# Patient Record
Sex: Female | Born: 1965 | Race: White | Hispanic: No | Marital: Married | Smoking: Never smoker
Health system: Southern US, Community
[De-identification: ages and names within clinical notes are randomized; demographics above are authoritative.]

## PROBLEM LIST (undated history)

## (undated) DIAGNOSIS — K5792 Diverticulitis of intestine, part unspecified, without perforation or abscess without bleeding: Secondary | ICD-10-CM

## (undated) DIAGNOSIS — K5730 Diverticulosis of large intestine without perforation or abscess without bleeding: Secondary | ICD-10-CM

## (undated) DIAGNOSIS — Z8742 Personal history of other diseases of the female genital tract: Secondary | ICD-10-CM

## (undated) DIAGNOSIS — K589 Irritable bowel syndrome without diarrhea: Secondary | ICD-10-CM

## (undated) DIAGNOSIS — G43909 Migraine, unspecified, not intractable, without status migrainosus: Secondary | ICD-10-CM

## (undated) HISTORY — PX: DILATION AND CURETTAGE OF UTERUS: SHX78

## (undated) HISTORY — PX: CHOLECYSTECTOMY: SHX55

## (undated) HISTORY — DX: Diverticulitis of intestine, part unspecified, without perforation or abscess without bleeding: K57.92

## (undated) HISTORY — DX: Diverticulosis of large intestine without perforation or abscess without bleeding: K57.30

## (undated) HISTORY — PX: WRIST ARTHROCENTESIS: SUR48

## (undated) HISTORY — DX: Irritable bowel syndrome, unspecified: K58.9

## (undated) HISTORY — PX: OTHER SURGICAL HISTORY: SHX169

## (undated) HISTORY — DX: Personal history of other diseases of the female genital tract: Z87.42

## (undated) HISTORY — PX: TONSILLECTOMY: SUR1361

## (undated) HISTORY — DX: Migraine, unspecified, not intractable, without status migrainosus: G43.909

---

## 1997-11-19 ENCOUNTER — Inpatient Hospital Stay (HOSPITAL_COMMUNITY): Admission: RE | Admit: 1997-11-19 | Discharge: 1997-11-19 | Payer: Self-pay | Admitting: Obstetrics and Gynecology

## 1998-01-10 ENCOUNTER — Inpatient Hospital Stay (HOSPITAL_COMMUNITY): Admission: AD | Admit: 1998-01-10 | Discharge: 1998-01-13 | Payer: Self-pay | Admitting: Obstetrics and Gynecology

## 1998-10-12 ENCOUNTER — Ambulatory Visit (HOSPITAL_COMMUNITY): Admission: RE | Admit: 1998-10-12 | Discharge: 1998-10-12 | Payer: Self-pay | Admitting: Internal Medicine

## 1999-03-20 ENCOUNTER — Other Ambulatory Visit: Admission: RE | Admit: 1999-03-20 | Discharge: 1999-03-20 | Payer: Self-pay | Admitting: Obstetrics and Gynecology

## 1999-08-18 ENCOUNTER — Ambulatory Visit (HOSPITAL_COMMUNITY): Admission: RE | Admit: 1999-08-18 | Discharge: 1999-08-18 | Payer: Self-pay | Admitting: Internal Medicine

## 1999-08-18 ENCOUNTER — Encounter: Payer: Self-pay | Admitting: Internal Medicine

## 2000-04-19 ENCOUNTER — Other Ambulatory Visit: Admission: RE | Admit: 2000-04-19 | Discharge: 2000-04-19 | Payer: Self-pay | Admitting: Obstetrics and Gynecology

## 2000-12-03 ENCOUNTER — Ambulatory Visit (HOSPITAL_COMMUNITY): Admission: RE | Admit: 2000-12-03 | Discharge: 2000-12-03 | Payer: Self-pay | Admitting: *Deleted

## 2000-12-03 ENCOUNTER — Encounter: Payer: Self-pay | Admitting: *Deleted

## 2001-05-20 ENCOUNTER — Other Ambulatory Visit: Admission: RE | Admit: 2001-05-20 | Discharge: 2001-05-20 | Payer: Self-pay | Admitting: Obstetrics and Gynecology

## 2002-06-03 ENCOUNTER — Other Ambulatory Visit: Admission: RE | Admit: 2002-06-03 | Discharge: 2002-06-03 | Payer: Self-pay | Admitting: Obstetrics and Gynecology

## 2003-04-09 ENCOUNTER — Ambulatory Visit (HOSPITAL_COMMUNITY): Admission: RE | Admit: 2003-04-09 | Discharge: 2003-04-09 | Payer: Self-pay | Admitting: Internal Medicine

## 2003-05-26 ENCOUNTER — Ambulatory Visit (HOSPITAL_COMMUNITY): Admission: RE | Admit: 2003-05-26 | Discharge: 2003-05-26 | Payer: Self-pay | Admitting: Obstetrics and Gynecology

## 2003-08-23 ENCOUNTER — Ambulatory Visit (HOSPITAL_COMMUNITY): Admission: RE | Admit: 2003-08-23 | Discharge: 2003-08-23 | Payer: Self-pay | Admitting: Internal Medicine

## 2003-08-31 ENCOUNTER — Emergency Department (HOSPITAL_COMMUNITY): Admission: EM | Admit: 2003-08-31 | Discharge: 2003-08-31 | Payer: Self-pay | Admitting: Emergency Medicine

## 2003-09-08 ENCOUNTER — Other Ambulatory Visit: Admission: RE | Admit: 2003-09-08 | Discharge: 2003-09-08 | Payer: Self-pay | Admitting: Obstetrics and Gynecology

## 2003-09-09 ENCOUNTER — Observation Stay (HOSPITAL_COMMUNITY): Admission: RE | Admit: 2003-09-09 | Discharge: 2003-09-10 | Payer: Self-pay | Admitting: General Surgery

## 2003-09-09 ENCOUNTER — Encounter (INDEPENDENT_AMBULATORY_CARE_PROVIDER_SITE_OTHER): Payer: Self-pay | Admitting: Specialist

## 2005-01-15 ENCOUNTER — Other Ambulatory Visit: Admission: RE | Admit: 2005-01-15 | Discharge: 2005-01-15 | Payer: Self-pay | Admitting: Obstetrics and Gynecology

## 2008-01-27 ENCOUNTER — Ambulatory Visit: Payer: Self-pay | Admitting: Nurse Practitioner

## 2008-03-09 ENCOUNTER — Ambulatory Visit: Payer: Self-pay | Admitting: Nurse Practitioner

## 2008-09-07 ENCOUNTER — Ambulatory Visit: Payer: Self-pay | Admitting: Nurse Practitioner

## 2008-09-15 ENCOUNTER — Ambulatory Visit (HOSPITAL_COMMUNITY): Admission: RE | Admit: 2008-09-15 | Discharge: 2008-09-15 | Payer: Self-pay | Admitting: Obstetrics and Gynecology

## 2008-12-14 ENCOUNTER — Ambulatory Visit: Payer: Self-pay | Admitting: Nurse Practitioner

## 2009-01-04 ENCOUNTER — Ambulatory Visit: Payer: Self-pay | Admitting: Nurse Practitioner

## 2009-03-01 ENCOUNTER — Ambulatory Visit: Payer: Self-pay | Admitting: Nurse Practitioner

## 2009-06-28 ENCOUNTER — Ambulatory Visit: Payer: Self-pay | Admitting: Nurse Practitioner

## 2010-04-11 ENCOUNTER — Ambulatory Visit: Payer: Self-pay | Admitting: Nurse Practitioner

## 2010-08-08 ENCOUNTER — Encounter (INDEPENDENT_AMBULATORY_CARE_PROVIDER_SITE_OTHER): Payer: 59 | Admitting: Nurse Practitioner

## 2010-08-08 DIAGNOSIS — G43109 Migraine with aura, not intractable, without status migrainosus: Secondary | ICD-10-CM

## 2010-08-09 NOTE — Assessment & Plan Note (Unsigned)
NAME:  Krista Carpenter, Krista Carpenter NO.:  [id number]  MEDICAL RECORD NO.:  [id number]           PATIENT TYPE:  LOCATION:  [facility] at [facility]           FACILITY:  PHYSICIAN:  Remonia Richter, NP   DATE OF BIRTH:  [birth year]  DATE OF SERVICE:  08/08/2010                                 CLINIC NOTE  The patient comes to the office today to discuss Botox injections.  The patient has been on multiple migraine headache preventatives in the past including Depakote, imipramine, atenolol, verapamil, amitriptyline, Effexor, Lexapro, Keppra, and Cymbalta.  She is currently on Topamax 50 mg 3 tablets per day.  She does not feel that she is able to go up on this dose as it actually makes her feel evil.  She does have significant irritability as a side effect.  She states that she is currently having 10 moderate headaches per month and 15 mild.  She in fact has a daily migraine headache of some sort.  The patient is well known to me.  She takes Relpax, Zoloft, and Vicodin as needed for headache.  She does appear to be an appropriate candidate for Botox injections.  We will see her back in the office after her approval.     Remonia Richter, NP    LR/MEDQ  D:  08/08/2010  T:  08/09/2010  Job:  [id number]

## 2010-08-24 ENCOUNTER — Other Ambulatory Visit (HOSPITAL_COMMUNITY): Payer: Self-pay | Admitting: Obstetrics and Gynecology

## 2010-08-24 DIAGNOSIS — Z1231 Encounter for screening mammogram for malignant neoplasm of breast: Secondary | ICD-10-CM

## 2010-08-31 ENCOUNTER — Ambulatory Visit (HOSPITAL_COMMUNITY): Payer: 59

## 2010-09-01 ENCOUNTER — Ambulatory Visit (HOSPITAL_COMMUNITY)
Admission: RE | Admit: 2010-09-01 | Discharge: 2010-09-01 | Disposition: A | Discharge status: Home / Self Care | Payer: 59 | Source: Ambulatory Visit | Attending: Obstetrics and Gynecology | Admitting: Obstetrics and Gynecology

## 2010-09-01 DIAGNOSIS — Z1231 Encounter for screening mammogram for malignant neoplasm of breast: Secondary | ICD-10-CM

## 2010-09-05 NOTE — Assessment & Plan Note (Signed)
NAME:  Krista Carpenter, Krista Carpenter               ACCOUNT NO.:  [id number]   MEDICAL RECORD NO.:  [id number]          PATIENT TYPE:  POB   LOCATION:  [facility] at [facility]         FACILITY:  [facility]   PHYSICIAN:  Tinnie Gens, MD        DATE OF BIRTH:  [birth year]   DATE OF SERVICE:                                  CLINIC NOTE   The patient comes to office today for followup on her migraine  headaches.  She did feel both the Mobic and the Norflex were helpful  with her headaches.  She also felt the going up on the Topamax was  helpful.  She has been having some emotional instability since going up  on the Topamax.  She feels that this may be related to her Lexapro  wearing out.  She is interested in switching her Lexapro over to  something else.  She has been on Effexor in the past.  When she was at  [facility], they discontinued the Effexor over a 2-  week period precipitating a hypertensive crisis.  She has also been on  imipramine in the past, does not want to take anything else that causes  weight gain.   PHYSICAL EXAM:  VITAL SIGNS:  Blood pressure is 113/76, pulse is 72,  weight is 147.   ALLERGIES:  No known allergies.   ASSESSMENT:  1. Migraine headache.  2. Anxiety.   PLAN:  We had a lengthy discussion concerning side effects of Topamax.  The patient is also convinced that she would like to go up on her  Lexapro as she is at the highest dose of Lexapro, we will switch her  over to 50 mg of Zoloft one p.o. daily #30 with 3 refills.  She will  also be given Xanax 0.5 mg one p.o. p.r.n. anxiety #20 with 1 refill.  She is asked to take this prior to becoming emotionally upset.  She is  also asking for refill on her Relpax.  She will be given Relpax 40 mg  one p.o. p.r.n. migraine #9 with p.r.n. refills.  The patient is asked  to return in 6 weeks or sooner as needed.      Remonia Richter, NP    ______________________________  Tinnie Gens, MD    LR/MEDQ  D:   01/04/2009  T:  01/05/2009  Job:  [id number]

## 2010-09-05 NOTE — Assessment & Plan Note (Signed)
NAME:  Krista Carpenter, Krista Carpenter               ACCOUNT NO.:  [id number]   MEDICAL RECORD NO.:  [id number]          PATIENT TYPE:  POB   LOCATION:  [facility] at [facility]         FACILITY:  [facility]   PHYSICIAN:  Jaynie Collins, MD     DATE OF BIRTH:  1966-04-19   DATE OF SERVICE:  12/14/2008                                  CLINIC NOTE   The patient comes to office today for followup on her migraine  headaches.  She feels that her headaches have been worse in the last few  weeks.  She has had a headache almost every day.  She had been taking  Vicodin and/or Relpax every day.  She had been seen back in May, from  that time she had gone up on her Topamax to 150 mg.  She does not admit  any more stress or anxiety than normal.  She has been having some neck  pain.  Her neck has been bothering her for a while.  She had been taking  Mobic for wrist problem, but had discontinued that.   PHYSICAL EXAMINATION:  VITAL SIGNS:  Blood pressure is 127/79, pulse is  76, weight is 148, height is 5 feet.   ASSESSMENT:  Migraine headaches and neck pain.   PLAN:  The patient will go up on her Topamax 200 mg.  She will take 1  p.o. nightly #30 with 6 refills.  She was also given a muscle relaxer  called Norflex, she can take one of those every 8 hours p.r.n. #40 with  3 refills.  She is also asked to return to taking her Mobic daily.  We  discussed at length some practical things such as not sleeping on her  stomach and improved posture.  If these things are unsuccessful for her,  she will call us back.  She does have a scheduled appointment in  November, and we can certainly see her before then if need be.      Remonia Richter, NP    ______________________________  Jaynie Collins, MD    LR/MEDQ  D:  12/14/2008  T:  12/15/2008  Job:  [id number]

## 2010-09-05 NOTE — Assessment & Plan Note (Signed)
NAME:  Krista, Carpenter NO.:  [id number]   MEDICAL RECORD NO.:  [id number]          PATIENT TYPE:  POB   LOCATION:  [facility] at [facility]         FACILITY:  [facility]   PHYSICIAN:  Argentina Donovan, MD        DATE OF BIRTH:  Aug 04, 1965   DATE OF SERVICE:                                  CLINIC NOTE   The patient comes to office today for followup on migraine headaches.  The patient took her prednisone pack and it did seem to break her  headache cycle.  She has stopped over using the Tylenol and anti-  inflammatories.  She has gotten up to 100 mg on her Topamax and she  feels that overall she is doing great.  She has only used 2-3 Relpax in  last 6 weeks.  She feels that this is the best that she has done in  years and would like to maintain at where she is now.  We will give her  a prescription for Topamax 100 mg #30 with six refills.  She will follow  up in 6 months or sooner as needed.      Krista Richter, NP    ______________________________  Argentina Donovan, MD    LR/MEDQ  D:  03/09/2008  T:  03/10/2008  Job:  [id number]

## 2010-09-05 NOTE — Assessment & Plan Note (Signed)
NAME:  Krista Carpenter, Krista Carpenter               ACCOUNT NO.:  [id number]   MEDICAL RECORD NO.:  [id number]          PATIENT TYPE:  POB   LOCATION:  [facility]         FACILITY:  [facility]   PHYSICIAN:  Scheryl Darter, MD       DATE OF BIRTH:  March 16, 1966   DATE OF SERVICE:                                  CLINIC NOTE   The patient comes to office today for followup on her migraine  headaches.  Since her last office visit, she has come off the Lexapro  and gone up to the Zoloft.  She is currently at 50 mg.  She is currently  at 150 mg of the Topamax.  She does not feel that the Relpax with the  Motrin 600 mg has been helpful.  Recently, we did have a lengthy  discussion today concerning emotional factors.  She does admit that her  daughter, Krista Carpenter, has social needs.  She has some difficulties letting  her mother leave and as a result, Krista Carpenter is not doing much for  herself.  Obviously, a lot more complicated than this, the child sees a  psychologist, has not been medicated, this has been ongoing since before  kindergarten.   ASSESSMENT:  Migraine headache.   PLAN:  Again, we had a lengthy discussion concerning stressors in her  life.  She was given multiple options including counseling for herself,  physical therapy, massage therapy, and exercise.  At this point, she  will go up on her Zoloft to 100 mg.  She will take 75 mg for a week and  then go up to 100.  She is given a prescription for 100, #30, with  p.r.n. refills of the Treximet.  We will change her over from Relpax to  Treximet.  She is given one sample and a prescription for #9 with p.r.n.  refills.  The patient will follow up in 4 weeks or sooner as needed.      Krista Richter, NP    ______________________________  Scheryl Darter, MD    LR/MEDQ  D:  03/01/2009  T:  03/02/2009  Job:  [id number]

## 2010-09-05 NOTE — Assessment & Plan Note (Signed)
NAME:  Krista Carpenter, Krista Carpenter               ACCOUNT NO.:  [id number]   MEDICAL RECORD NO.:  [id number]          PATIENT TYPE:  POB   LOCATION:  [facility] at [facility]         FACILITY:  [facility]   PHYSICIAN:  Tinnie Gens, MD        DATE OF BIRTH:  [birth year]   DATE OF SERVICE:  09/07/2008                                  CLINIC NOTE   The patient comes today to the office for followup on her migraine  headaches.  The patient was last seen in November 2009.  At that point,  she was doing very well.  She was on the 100 mg of Topamax taking Relpax  as needed and overall doing well.  In the last few months, she feels  that her daily headaches have returned.  She felt originally that this  may be related to worsening of her allergies.  She particularly had a  very severe headache on Sunday.  She did not have her medications with  her at church and after church, they had a blood drive and by the time  she got home, she was suffering severely, since then she has not been  able to go to work on Monday or had to leave work on Monday and today is  requesting trigger point injections.   PHYSICAL EXAMINATION:  GENERAL:  Well-developed, well-nourished, 45-year-  old Caucasian female, in no acute distress.  HEENT:  Head is normocephalic and atraumatic.  The temple area as well  as the occipital area are tender to touch.   PROCEDURE:  Please see trigger point injection.   PLAN:  We had a very lengthy discussion today.  We have decided to go up  on her Topamax 250 mg t.i.d.  She is given a prescription for #90 with 6  refills.  She will also be given a refill on her rescue medication,  Vicodin EF 1 p.o. q.6 h p.r.n. pain #30 with no refills.  She is also  asked to add Phenergan as need be, Phenergan prescription is 25 mg 1  p.o. q.6 h p.r.n. #30 with 2 refills.  She is also given trigger point  injections today and she will be given a prescription for prednisone  taper that she will have in the future if she  has a significant headache  like this.  She is asked to follow up in 6 months or sooner.      Remonia Richter, NP    ______________________________  Tinnie Gens, MD    LR/MEDQ  D:  09/07/2008  T:  09/08/2008  Job:  [id number]

## 2010-09-05 NOTE — Assessment & Plan Note (Signed)
NAME:  Krista Carpenter, PATERNOSTRO NO.:  [id number]   MEDICAL RECORD NO.:  [id number]          PATIENT TYPE:  POB   LOCATION:  [facility] at [facility]         FACILITY:  [facility]   PHYSICIAN:  Tinnie Gens, MD        DATE OF BIRTH:  March 30, 1966   DATE OF SERVICE:  04/11/2010                                  CLINIC NOTE   The patient comes to the office today for followup on her migraine  headaches.  The patient was last seen in March 2011.  Since then, she  was doing very well.  She did have an episode this past summer where she  had some problems with her headaches, but since then she has been doing  well up until a few weeks ago.  She feels that she is just stressed with  Christmas.  She is requesting either trigger point injection or  prednisone taper.   PHYSICAL EXAMINATION:  VITAL SIGNS:  Blood pressure is 120/81, pulse is  80, weight is 145.  CARDIAC:  Regular rate and rhythm.  LUNGS:  Clear bilaterally.  NEUROLOGICAL:  Alert, oriented.   ASSESSMENT:  Migraine.   PLAN:  Refill meds, Relpax, Topamax, Zoloft, Norflex, Xanax.  She will  be given prednisone taper with one refill and Vicodin for rescue.  She  will return to the clinic in 6 months to 1 year.      Remonia Richter, NP    ______________________________  Tinnie Gens, MD    LR/MEDQ  D:  04/11/2010  T:  04/12/2010  Job:  [id number]

## 2010-09-05 NOTE — Assessment & Plan Note (Signed)
NAME:  Krista Carpenter, Krista Carpenter NO.:  [id number]   MEDICAL RECORD NO.:  [id number]          PATIENT TYPE:  POB   LOCATION:  [facility] at [facility]         FACILITY:  [facility]   PHYSICIAN:  Scheryl Darter, MD       DATE OF BIRTH:  1966/03/26   DATE OF SERVICE:  06/28/2009                                  CLINIC NOTE   The patient comes to the office today for followup on her migraine  headaches.  The patient is doing very well with her headaches.  Approximately 1 month ago, she took a drug holiday from her verapamil.  She feels that her headaches have improved.  Since then she is not sure  if there is a relationship.  She was put on the verapamil following a  rapid withdrawal from Effexor.  She had never really developed high  blood pressure.  She has been taking her blood pressure at work and her  blood pressure has been fine.  She had to go up on the Zoloft to 100 mg.  She is currently at 150 of the Topamax.  She did not feel that the  Topamax is very helpful and would like to trial a Maxalt at this point.   PHYSICAL EXAMINATION:  GENERAL:  Well-developed, well-nourished 45-year-  old Caucasian female in no acute distress.  VITAL SIGNS:  Blood pressure is 135/84, pulse is 84, weight 148.   ASSESSMENT:  Migraine headaches.   PLAN:  1. The patient is okay to stay off the verapamil.  She is advised to      continue to monitor her blood pressure.  2. Maxalt-MLT 10 mg 1 p.o. p.r.n. migraine #12 with p.r.n. refills.  3. Refill Topamax 50 mg 3 tabs nightly #270 with 3 refills.   The patient is asked to return to the office in 6 months to 1 year  depending on how her headaches do.      Remonia Richter, NP    ______________________________  Scheryl Darter, MD    LR/MEDQ  D:  06/28/2009  T:  06/29/2009  Job:  [id number]

## 2010-09-05 NOTE — Assessment & Plan Note (Signed)
NAME:  Krista Carpenter, Krista Carpenter               ACCOUNT NO.:  [id number]   MEDICAL RECORD NO.:  [id number]          PATIENT TYPE:  POB   LOCATION:  [facility] at [facility]         FACILITY:  [facility]   PHYSICIAN:  Tinnie Gens, MD        DATE OF BIRTH:  October 07, 1965   DATE OF SERVICE:                                  CLINIC NOTE   The patient comes to the office today for a headache consultation.  The  patient is well known to me from [facility].  She has  not been seen there for approximately 3 years.  She left after being  withdrawn from Effexor quickly, developing hypertension and put into a  Botox study.  She did not think she got the Botox.  Since that time, she  has been seeing her family doctor.  She has been on verapamil and  Lexapro for the last 3 years.  She is currently taking Excedrin or  Tylenol daily.  She has not been using any triptans.  She did have a  very bad headache a couple of weeks ago.  She called her OB/GYN, Dr.  Henderson Cloud, was given Vicodin.  The headache did not resolve.  She ended up  taking Demerol and Phenergan and wanted to restart on some headache  prevention.  She is a Engineer, civil (consulting) at [employer].  She is currently having  almost daily headaches.  She is having 10 moderate per month, 8-10  severe headaches per month, and mostly a daily mild headache.   OBSTETRICAL HISTORY:  G3, P2.   GYN HISTORY:  She currently has a Mirena IUD.   SURGICAL HISTORY:  Gallbladder removed, tonsils removed.   FAMILY HISTORY:  The patient has high blood pressure.  Father, brother,  grandfather have diabetes.   PERSONAL HISTORY:  High cholesterol, high blood pressure.   SOCIAL HISTORY:  The patient lives at home with her husband, her son,  and her daughter.  She is currently a Engineer, civil (consulting) at [employer].  She  does not smoke.  She drinks alcohol socially.  She has 0-1 caffeinated  beverages per day.   REVIEW OF SYSTEMS:  Negative for bruising, numbness, swelling, muscle  aches, fevers, fatigue.  She does have frequent headaches and dizzy  spells.  She denies chest pain, nausea, hot flashes, vaginal pain or  odor.   PHYSICAL EXAMINATION:  VITAL SIGNS:  Blood pressure is 121/94, pulse is  92, weight is 153.5, height is 5 feet 0 inches.  GENERAL:  Well-developed, well-nourished 45 year old Caucasian female in  no acute distress.  HEENT:  Head is normocephalic and atraumatic.  Pupils are equal and  reactive.  CARDIAC:  Regular rate and rhythm.  LUNGS:  Clear bilaterally.  NEUROLOGIC:  The patient is alert, oriented, and neurologically intact.  She has good muscle sensation and good coordination.   ASSESSMENT AND PLAN:  Chronic migraine headaches.  We did have a lengthy  discussion today and have opted for prednisone to get her off her daily  Excedrin and Tylenol.  She will take a prednisone taper 20 mg 4 tablets  for 1 day, 3 tablets for 1 day, 2 tablets for  1 day, and 1 tablet for 1  day.  She will also start on Topamax 25 mg one p.o. daily for 1 week, 2  p.o. daily for 1 week, and then up to 3 until seen again.  She was given  Relpax 40 mg to take on an as-needed basis.  She will return to this  clinic in 6 weeks for follow-up.  She will call the office if she is  having any problems sooner.      Remonia Richter, NP    ______________________________  Tinnie Gens, MD    LR/MEDQ  D:  01/27/2008  T:  01/28/2008  Job:  [id number]

## 2010-09-08 NOTE — Op Note (Signed)
NAME:  MULKI, ROESLER                         ACCOUNT NO.:  [id number]   MEDICAL RECORD NO.:  [id number]                   PATIENT TYPE:  AMB   LOCATION:  DAY                                  FACILITY:  [facility]   PHYSICIAN:  Angelia Mould. Derrell Lolling, M.D.             DATE OF BIRTH:  1965-07-17   DATE OF PROCEDURE:  09/09/2003  DATE OF DISCHARGE:                                 OPERATIVE REPORT   PREOPERATIVE DIAGNOSES:  Chronic cholecystitis with cholelithiasis.   POSTOPERATIVE DIAGNOSES:  Chronic cholecystitis with cholelithiasis.   OPERATION PERFORMED:  Laparoscopic cholecystectomy with intraoperative  cholangiogram.   SURGEON:  Angelia Mould. Derrell Lolling, M.D.   FIRST ASSISTANT:  Timothy E. Earlene Plater, M.D.   INDICATIONS FOR PROCEDURE:  This is a 45 year old white female who has been  having intermittent episodes of nausea and right back pain since December of  last year.  This has been occurring more frequently and has been associated  with some right upper quadrant anterior abdominal pain.  She had a severe  attack recently which lead to an ultrasound which shows numerous gallstones.  She had laboratory work during the acute attack and she had mildly elevated  liver function tests. They have returned normal. She is brought to the  operating room electively for a cholecystitis.   FINDINGS:  The gallbladder looked thin walled, slightly discolored, contains  numerous stones. The anatomy of the cystic duct, cystic artery and common  bile duct were conventional. The cholangiogram was normal showing normal  intrahepatic and extrahepatic biliary anatomy, no filling defects and prompt  flow of contrast into the duodenum. The stomach, duodenum, liver, small  intestine, large intestine and peritoneal surfaces looked normal.   TECHNIQUE:  Following the induction of general endotracheal anesthesia, the  patient's abdomen was prepped and draped in a sterile fashion. Marcaine 0.5%  with epinephrine was used  as a local infiltration anesthetic.  A vertically  oriented incision was made inside the lower rim of the umbilicus.  The  fascia was incised in the midline and the abdominal cavity entered under  direct vision.  A 10 mm Hasson trocar was inserted and secured with a  pursestring suture of #0 Vicryl.  Pneumoperitoneum was created.  The video  camera was inserted with visualization and findings as described above. A 10  mm trocar was placed in the subxiphoid region and two 5 mm trocars placed in  the right mid abdomen. The gallbladder was elevated. The infundibulum was  identified and retracted laterally. We dissected out the cystic duct and the  cystic artery. We created a nice window behind the cystic duct and  identified the common bile duct. A metal clip was placed on the cystic duct  close to the gallbladder. A cholangiogram catheter was inserted into the  cystic duct. A cholangiogram was obtained using a C-arm.  This showed normal  intrahepatic and extrahepatic biliary anatomy, no filling defects  and prompt  flow of contrast into the duodenum. The cholangiogram catheter was removed,  the cystic duct secured with multiple metal clips and divided. The cystic  artery was isolated as it went onto the gallbladder wall, secured with metal  clips and divided. The gallbladder was dissected from its bed with  electrocautery and removed through the umbilical port.  We had a little bit  of bleeding from raw liver surface in the gallbladder bed and had to spend a  short period of time to control that with electrocautery.  Ultimately this  was controlled. All the irrigation fluid was clear at the end of the case  and there was no bleeding and no bile leak whatsoever. We did place some  Surgicel gauze in the gallbladder bed.  We checked around and saw no  residual fluid. There was no bleeding.  The trocars were removed under  direct vision and there was no bleeding from the trocar sites. The   pneumoperitoneum was released. The fascia at the umbilicus was closed with  #0 Vicryl sutures. The skin incisions were closed with subcuticular sutures  of 4-0 Vicryl and Steri-Strips. Clean bandages were placed and the patient  taken to the recovery room in stable condition. Estimated blood loss was  about 28 mL.  Complications none. Sponge, needle and instrument counts were  correct.                                               Angelia Mould. Derrell Lolling, M.D.    HMI/MEDQ  D:  09/09/2003  T:  09/09/2003  Job:  [id number]   cc:   Ike Bene, M.D.  [address]  [place]  [place] [address]  Fax: [phone]

## 2011-03-21 ENCOUNTER — Other Ambulatory Visit: Payer: Self-pay | Admitting: Nurse Practitioner

## 2011-03-30 ENCOUNTER — Telehealth: Payer: Self-pay | Admitting: *Deleted

## 2011-03-30 MED ORDER — PREDNISONE 10 MG PO TABS: 20.0000 mg | ORAL_TABLET | Freq: Every day | ORAL | Status: DC

## 2011-03-30 MED ORDER — PROMETHAZINE HCL 12.5 MG PO TABS: 12.5000 mg | ORAL_TABLET | Freq: Four times a day (QID) | ORAL | Status: DC | PRN

## 2011-03-30 NOTE — Telephone Encounter (Signed)
Patient has had a terrible migraine for a week.  She made an appointment but cannot get in until January due to our schedule.  I have called in a predisone taper, refill of phenergan and 10 Vicoden Es for her to get her through this.  She will call back Monday to let us know how she is doing.

## 2011-04-26 ENCOUNTER — Telehealth: Payer: Self-pay | Admitting: *Deleted

## 2011-04-26 MED ORDER — TOPIRAMATE 50 MG PO TABS: 50.0000 mg | ORAL_TABLET | Freq: Three times a day (TID) | ORAL | Status: DC

## 2011-04-26 MED ORDER — SERTRALINE HCL 100 MG PO TABS: 100.0000 mg | ORAL_TABLET | Freq: Every day | ORAL | Status: DC

## 2011-04-26 NOTE — Telephone Encounter (Signed)
Patient has an appointment set up for follow up but has run out of her medications and needs refills.

## 2011-05-08 ENCOUNTER — Ambulatory Visit (INDEPENDENT_AMBULATORY_CARE_PROVIDER_SITE_OTHER): Payer: 59 | Admitting: Nurse Practitioner

## 2011-05-08 ENCOUNTER — Encounter: Payer: Self-pay | Admitting: Nurse Practitioner

## 2011-05-08 DIAGNOSIS — G43909 Migraine, unspecified, not intractable, without status migrainosus: Secondary | ICD-10-CM

## 2011-05-08 MED ORDER — PREDNISONE 20 MG PO TABS: 20.0000 mg | ORAL_TABLET | Freq: Every day | ORAL | Status: DC

## 2011-05-08 MED ORDER — SERTRALINE HCL 100 MG PO TABS: 100.0000 mg | ORAL_TABLET | Freq: Every day | ORAL | Status: DC

## 2011-05-08 MED ORDER — TOPIRAMATE 50 MG PO TABS: 50.0000 mg | ORAL_TABLET | Freq: Three times a day (TID) | ORAL | Status: DC

## 2011-05-08 MED ORDER — RIZATRIPTAN BENZOATE 10 MG PO TABS: 10.0000 mg | ORAL_TABLET | Freq: Once | ORAL | Status: DC | PRN

## 2011-05-08 MED ORDER — HYDROCODONE-ACETAMINOPHEN 7.5-750 MG PO TABS: 1.0000 | ORAL_TABLET | Freq: Four times a day (QID) | ORAL | Status: DC | PRN

## 2011-05-08 MED ORDER — ORPHENADRINE CITRATE ER 100 MG PO TB12: 100.0000 mg | ORAL_TABLET | Freq: Two times a day (BID) | ORAL | Status: AC

## 2011-05-08 MED ORDER — PROMETHAZINE HCL 25 MG PO TABS: 25.0000 mg | ORAL_TABLET | Freq: Four times a day (QID) | ORAL | Status: DC | PRN

## 2011-05-08 NOTE — Patient Instructions (Signed)

## 2011-05-08 NOTE — Progress Notes (Signed)
S: Pt is following up on migraine headaches. Last seen April 2011. We were talking about doing botox at that time and patient did not follow through with paper work. Now she feels headaches are better. In part because she has been taking daily Claritin for allergies. She can not give an exact number of headaches per month.  She did have a bad episode in Dec related to father-in-law having stroke. They inherited his dog Nicki. She called the office and was given prednisone taper and pain meds . Would like to keep things the same now and requests refill on meds only.  A: Migraine headache  P: refill Topamax, zoloft, maxalt, phenergan, vicoden ,norflex and pt request prednisone taper to hold. She will return in one year or prn

## 2011-11-13 ENCOUNTER — Telehealth: Payer: Self-pay | Admitting: Nurse Practitioner

## 2011-11-13 DIAGNOSIS — G43909 Migraine, unspecified, not intractable, without status migrainosus: Secondary | ICD-10-CM

## 2011-11-13 MED ORDER — PREDNISONE 20 MG PO TABS: 20.0000 mg | ORAL_TABLET | Freq: Every day | ORAL | Status: DC

## 2011-11-13 NOTE — Addendum Note (Signed)
Addended by: Delbert Phenix on: 11/13/2011 01:02 PM   Modules accepted: Orders

## 2011-11-13 NOTE — Telephone Encounter (Signed)
Patient would like to have a Prednisone dose pack called in for her headaches.  She uses [facility] on [address] in [place].  She is at work now if you need to speak with her at [phone].

## 2011-12-18 ENCOUNTER — Encounter: Payer: Self-pay | Admitting: Nurse Practitioner

## 2011-12-18 ENCOUNTER — Ambulatory Visit (INDEPENDENT_AMBULATORY_CARE_PROVIDER_SITE_OTHER): Payer: 59 | Admitting: Nurse Practitioner

## 2011-12-18 VITALS — BP 127/85 | HR 73 | Ht 60.0 in | Wt 142.0 lb

## 2011-12-18 DIAGNOSIS — E663 Overweight: Secondary | ICD-10-CM | POA: Insufficient documentation

## 2011-12-18 DIAGNOSIS — M62838 Other muscle spasm: Secondary | ICD-10-CM

## 2011-12-18 DIAGNOSIS — G43909 Migraine, unspecified, not intractable, without status migrainosus: Secondary | ICD-10-CM

## 2011-12-18 MED ORDER — SERTRALINE HCL 100 MG PO TABS: 50.0000 mg | ORAL_TABLET | Freq: Three times a day (TID) | ORAL | Status: DC

## 2011-12-18 MED ORDER — HYDROCODONE-ACETAMINOPHEN 7.5-750 MG PO TABS: 1.0000 | ORAL_TABLET | Freq: Four times a day (QID) | ORAL | Status: DC | PRN

## 2011-12-18 MED ORDER — PROMETHAZINE HCL 25 MG PO TABS: 25.0000 mg | ORAL_TABLET | Freq: Four times a day (QID) | ORAL | Status: DC | PRN

## 2011-12-18 MED ORDER — ALPRAZOLAM 0.5 MG PO TABS: 0.5000 mg | ORAL_TABLET | Freq: Every evening | ORAL | Status: AC | PRN

## 2011-12-18 NOTE — Progress Notes (Signed)
S: Follow up today on migraines. Not doing very well over most of the summer. She is having an almost daily migraine of some degree. Her migraines can last 4 hours to 3 days.  Stressed with work and some trouble with the weather. Feels mostly worse due to anxiety. Also has muscle spasm in neck.   O: alert oriented, NAD Neuro negative Skin warm and dry  A: Migraine Anxiety  P: Pt will increase Zoloft to 150mg  daily Xanax prn TENs unit to use prn Refill rescue meds phenergan and vicoden Consider Botox RTC prn

## 2011-12-18 NOTE — Patient Instructions (Signed)

## 2012-06-17 ENCOUNTER — Encounter: Payer: Self-pay | Admitting: Nurse Practitioner

## 2012-06-17 ENCOUNTER — Ambulatory Visit (INDEPENDENT_AMBULATORY_CARE_PROVIDER_SITE_OTHER): Payer: 59 | Admitting: Nurse Practitioner

## 2012-06-17 VITALS — BP 120/80 | HR 80 | Ht 59.5 in | Wt 144.0 lb

## 2012-06-17 DIAGNOSIS — G43909 Migraine, unspecified, not intractable, without status migrainosus: Secondary | ICD-10-CM

## 2012-06-17 DIAGNOSIS — F419 Anxiety disorder, unspecified: Secondary | ICD-10-CM

## 2012-06-17 DIAGNOSIS — F411 Generalized anxiety disorder: Secondary | ICD-10-CM

## 2012-06-17 DIAGNOSIS — M62838 Other muscle spasm: Secondary | ICD-10-CM

## 2012-06-17 MED ORDER — TOPIRAMATE 50 MG PO TABS: 50.0000 mg | ORAL_TABLET | Freq: Three times a day (TID) | ORAL | Status: DC

## 2012-06-17 MED ORDER — ORPHENADRINE CITRATE ER 100 MG PO TB12: 100.0000 mg | ORAL_TABLET | Freq: Two times a day (BID) | ORAL | Status: DC | PRN

## 2012-06-17 MED ORDER — ALPRAZOLAM 0.5 MG PO TABS: 0.5000 mg | ORAL_TABLET | Freq: Three times a day (TID) | ORAL | Status: DC | PRN

## 2012-06-17 MED ORDER — HYDROCODONE-ACETAMINOPHEN 7.5-750 MG PO TABS: 1.0000 | ORAL_TABLET | Freq: Four times a day (QID) | ORAL | Status: DC | PRN

## 2012-06-17 MED ORDER — RIZATRIPTAN BENZOATE 10 MG PO TABS: 10.0000 mg | ORAL_TABLET | Freq: Once | ORAL | Status: DC | PRN

## 2012-06-17 NOTE — Patient Instructions (Signed)
Migraine Headache A migraine headache is an intense, throbbing pain on one or both sides of your head. A migraine can last for 30 minutes to several hours. CAUSES  The exact cause of a migraine headache is not always known. However, a migraine may be caused when nerves in the brain become irritated and release chemicals that cause inflammation. This causes pain. SYMPTOMS  Pain on one or both sides of your head.  Pulsating or throbbing pain.  Severe pain that prevents daily activities.  Pain that is aggravated by any physical activity.  Nausea, vomiting, or both.  Dizziness.  Pain with exposure to bright lights, loud noises, or activity.  General sensitivity to bright lights, loud noises, or smells. Before you get a migraine, you may get warning signs that a migraine is coming (aura). An aura may include:  Seeing flashing lights.  Seeing bright spots, halos, or zig-zag lines.  Having tunnel vision or blurred vision.  Having feelings of numbness or tingling.  Having trouble talking.  Having muscle weakness. MIGRAINE TRIGGERS  Alcohol.  Smoking.  Stress.  Menstruation.  Aged cheeses.  Foods or drinks that contain nitrates, glutamate, aspartame, or tyramine.  Lack of sleep.  Chocolate.  Caffeine.  Hunger.  Physical exertion.  Fatigue.  Medicines used to treat chest pain (nitroglycerine), birth control pills, estrogen, and some blood pressure medicines. DIAGNOSIS  A migraine headache is often diagnosed based on:  Symptoms.  Physical examination.  A CT scan or MRI of your head. TREATMENT Medicines may be given for pain and nausea. Medicines can also be given to help prevent recurrent migraines.  HOME CARE INSTRUCTIONS  Only take over-the-counter or prescription medicines for pain or discomfort as directed by your caregiver. The use of long-term narcotics is not recommended.  Lie down in a dark, quiet room when you have a migraine.  Keep a journal  to find out what may trigger your migraine headaches. For example, write down:  What you eat and drink.  How much sleep you get.  Any change to your diet or medicines.  Limit alcohol consumption.  Quit smoking if you smoke.  Get 7 to 9 hours of sleep, or as recommended by your caregiver.  Limit stress.  Keep lights dim if bright lights bother you and make your migraines worse. SEEK IMMEDIATE MEDICAL CARE IF:   Your migraine becomes severe.  You have a fever.  You have a stiff neck.  You have vision loss.  You have muscular weakness or loss of muscle control.  You start losing your balance or have trouble walking.  You feel faint or pass out.  You have severe symptoms that are different from your first symptoms. MAKE SURE YOU:   Understand these instructions.  Will watch your condition.  Will get help right away if you are not doing well or get worse. Document Released: 04/09/2005 Document Revised: 07/02/2011 Document Reviewed: 03/30/2011 ExitCare Patient Information 2013 [employer].  

## 2012-12-09 ENCOUNTER — Ambulatory Visit (HOSPITAL_COMMUNITY)
Admission: RE | Admit: 2012-12-09 | Discharge: 2012-12-09 | Disposition: A | Discharge status: Home / Self Care | Payer: 59 | Source: Ambulatory Visit | Attending: Obstetrics and Gynecology | Admitting: Obstetrics and Gynecology

## 2012-12-09 ENCOUNTER — Other Ambulatory Visit (HOSPITAL_COMMUNITY): Payer: Self-pay | Admitting: Obstetrics and Gynecology

## 2012-12-09 DIAGNOSIS — Z1231 Encounter for screening mammogram for malignant neoplasm of breast: Secondary | ICD-10-CM

## 2012-12-31 ENCOUNTER — Encounter: Payer: Self-pay | Admitting: Nurse Practitioner

## 2012-12-31 NOTE — Progress Notes (Signed)
S: Pt continues to struggle with migraines, anxiety and muscle spasm. We have discussed ways to relieve each  O: Alert, oriented, anxious Cardiac: RRR Lungs: [place]  A: Migraine, anxiety, muscle spasm  P: Reviewed medications, disease state, non medication management  `

## 2013-02-24 ENCOUNTER — Ambulatory Visit (INDEPENDENT_AMBULATORY_CARE_PROVIDER_SITE_OTHER): Payer: 59 | Admitting: Nurse Practitioner

## 2013-02-24 ENCOUNTER — Encounter: Payer: Self-pay | Admitting: Nurse Practitioner

## 2013-02-24 VITALS — BP 133/87 | HR 79 | Ht 60.0 in | Wt 125.0 lb

## 2013-02-24 DIAGNOSIS — F411 Generalized anxiety disorder: Secondary | ICD-10-CM

## 2013-02-24 DIAGNOSIS — M62838 Other muscle spasm: Secondary | ICD-10-CM

## 2013-02-24 DIAGNOSIS — F419 Anxiety disorder, unspecified: Secondary | ICD-10-CM

## 2013-02-24 DIAGNOSIS — G43909 Migraine, unspecified, not intractable, without status migrainosus: Secondary | ICD-10-CM

## 2013-02-24 MED ORDER — ORPHENADRINE CITRATE ER 100 MG PO TB12: 100.0000 mg | ORAL_TABLET | Freq: Two times a day (BID) | ORAL | Status: DC | PRN

## 2013-02-24 MED ORDER — ALPRAZOLAM 0.5 MG PO TABS: 0.5000 mg | ORAL_TABLET | Freq: Three times a day (TID) | ORAL | Status: DC | PRN

## 2013-02-24 MED ORDER — SERTRALINE HCL 100 MG PO TABS: 150.0000 mg | ORAL_TABLET | Freq: Every day | ORAL | Status: DC

## 2013-02-24 MED ORDER — TOPIRAMATE 50 MG PO TABS: 50.0000 mg | ORAL_TABLET | Freq: Three times a day (TID) | ORAL | Status: DC

## 2013-02-24 NOTE — Patient Instructions (Signed)
Migraine Headache A migraine headache is an intense, throbbing pain on one or both sides of your head. A migraine can last for 30 minutes to several hours. CAUSES  The exact cause of a migraine headache is not always known. However, a migraine may be caused when nerves in the brain become irritated and release chemicals that cause inflammation. This causes pain. SYMPTOMS  Pain on one or both sides of your head.  Pulsating or throbbing pain.  Severe pain that prevents daily activities.  Pain that is aggravated by any physical activity.  Nausea, vomiting, or both.  Dizziness.  Pain with exposure to bright lights, loud noises, or activity.  General sensitivity to bright lights, loud noises, or smells. Before you get a migraine, you may get warning signs that a migraine is coming (aura). An aura may include:  Seeing flashing lights.  Seeing bright spots, halos, or zig-zag lines.  Having tunnel vision or blurred vision.  Having feelings of numbness or tingling.  Having trouble talking.  Having muscle weakness. MIGRAINE TRIGGERS  Alcohol.  Smoking.  Stress.  Menstruation.  Aged cheeses.  Foods or drinks that contain nitrates, glutamate, aspartame, or tyramine.  Lack of sleep.  Chocolate.  Caffeine.  Hunger.  Physical exertion.  Fatigue.  Medicines used to treat chest pain (nitroglycerine), birth control pills, estrogen, and some blood pressure medicines. DIAGNOSIS  A migraine headache is often diagnosed based on:  Symptoms.  Physical examination.  A CT scan or MRI of your head. TREATMENT Medicines may be given for pain and nausea. Medicines can also be given to help prevent recurrent migraines.  HOME CARE INSTRUCTIONS  Only take over-the-counter or prescription medicines for pain or discomfort as directed by your caregiver. The use of long-term narcotics is not recommended.  Lie down in a dark, quiet room when you have a migraine.  Keep a journal  to find out what may trigger your migraine headaches. For example, write down:  What you eat and drink.  How much sleep you get.  Any change to your diet or medicines.  Limit alcohol consumption.  Quit smoking if you smoke.  Get 7 to 9 hours of sleep, or as recommended by your caregiver.  Limit stress.  Keep lights dim if bright lights bother you and make your migraines worse. SEEK IMMEDIATE MEDICAL CARE IF:   Your migraine becomes severe.  You have a fever.  You have a stiff neck.  You have vision loss.  You have muscular weakness or loss of muscle control.  You start losing your balance or have trouble walking.  You feel faint or pass out.  You have severe symptoms that are different from your first symptoms. MAKE SURE YOU:   Understand these instructions.  Will watch your condition.  Will get help right away if you are not doing well or get worse. Document Released: 04/09/2005 Document Revised: 07/02/2011 Document Reviewed: 03/30/2011 ExitCare Patient Information 2014 [employer].  

## 2013-02-24 NOTE — Progress Notes (Addendum)
History:  Krista Carpenter is a 47 y.o. [phone] who presents to [facility] today for follow up on migraine headaches. Her migraines are actually doing well. No severe, 3 moderate and few milds. She still has Maxalt from Sept that are unused. She has been on diet and lost 20 lbs. She has seasonal allergies and takes Claritin daily. She remains unhappy with her job at [employer] in [employer].    The following portions of the patient's history were reviewed and updated as appropriate: allergies, current medications, past family history, past medical history, past social history, past surgical history and problem list.  Review of Systems:  Pertinent items are noted in HPI.  Objective:  Physical Exam BP 133/87  Pulse 79  Ht 5' (1.524 m)  Wt 125 lb (56.7 kg)  BMI 24.41 kg/m2 GENERAL: Well-developed, well-nourished female in no acute distress.  HEENT: Normocephalic, atraumatic.  NECK: Supple. Normal thyroid.  LUNGS: Normal rate. Clear to auscultation bilaterally.  HEART: Regular rate and rhythm with no adventitious sounds.  EXTREMITIES: No cyanosis, clubbing, or edema, 2+ distal pulses.   Labs and Imaging No results found.  Assessment & Plan:  Assessment:  Migraine  Anxiety  Plans:  Refill Medications RTC 1 year  Carolynn Serve, NP 02/24/2013 2:58 PM

## 2013-07-02 ENCOUNTER — Other Ambulatory Visit: Payer: Self-pay | Admitting: *Deleted

## 2013-07-02 DIAGNOSIS — G43909 Migraine, unspecified, not intractable, without status migrainosus: Secondary | ICD-10-CM

## 2013-07-02 MED ORDER — RIZATRIPTAN BENZOATE 10 MG PO TABS
10.0000 mg | ORAL_TABLET | Freq: Once | ORAL | Status: DC | PRN
Start: 2013-07-02 — End: 2014-02-16

## 2013-07-02 NOTE — Telephone Encounter (Signed)
Received a refill request from pt pharmacy on her Rizatriptan 10 mg tab.  I have sent in refills for pt to her pharmacy.

## 2013-10-09 ENCOUNTER — Telehealth: Payer: Self-pay | Admitting: *Deleted

## 2013-10-09 DIAGNOSIS — G43911 Migraine, unspecified, intractable, with status migrainosus: Secondary | ICD-10-CM

## 2013-10-09 MED ORDER — PREDNISONE 20 MG PO TABS
ORAL_TABLET | ORAL | Status: DC
Start: 2013-10-09 — End: 2015-05-31

## 2013-10-09 NOTE — Telephone Encounter (Signed)
Patient is having a migraine for several days and would like a prednisone taper to help relieve the pain.

## 2014-01-08 ENCOUNTER — Encounter: Payer: 59 | Admitting: Nurse Practitioner

## 2014-01-12 ENCOUNTER — Encounter: Payer: 59 | Admitting: Nurse Practitioner

## 2014-01-19 ENCOUNTER — Encounter: Payer: 59 | Admitting: Nurse Practitioner

## 2014-02-16 ENCOUNTER — Ambulatory Visit (INDEPENDENT_AMBULATORY_CARE_PROVIDER_SITE_OTHER): Payer: 59 | Admitting: Nurse Practitioner

## 2014-02-16 ENCOUNTER — Encounter: Payer: Self-pay | Admitting: Nurse Practitioner

## 2014-02-16 VITALS — BP 132/93 | HR 64 | Ht 59.0 in | Wt 133.0 lb

## 2014-02-16 DIAGNOSIS — F419 Anxiety disorder, unspecified: Secondary | ICD-10-CM

## 2014-02-16 DIAGNOSIS — M62838 Other muscle spasm: Secondary | ICD-10-CM

## 2014-02-16 DIAGNOSIS — G43009 Migraine without aura, not intractable, without status migrainosus: Secondary | ICD-10-CM

## 2014-02-16 MED ORDER — RIZATRIPTAN BENZOATE 10 MG PO TABS: 10.0000 mg | ORAL_TABLET | Freq: Once | ORAL | Status: DC | PRN

## 2014-02-16 MED ORDER — SERTRALINE HCL 100 MG PO TABS: 100.0000 mg | ORAL_TABLET | Freq: Two times a day (BID) | ORAL | Status: DC

## 2014-02-16 MED ORDER — ALPRAZOLAM 0.5 MG PO TABS: 0.5000 mg | ORAL_TABLET | Freq: Three times a day (TID) | ORAL | Status: DC | PRN

## 2014-02-16 MED ORDER — PROMETHAZINE HCL 25 MG PO TABS: 25.0000 mg | ORAL_TABLET | Freq: Four times a day (QID) | ORAL | Status: DC | PRN

## 2014-02-16 MED ORDER — ORPHENADRINE CITRATE ER 100 MG PO TB12: 100.0000 mg | ORAL_TABLET | Freq: Two times a day (BID) | ORAL | Status: DC | PRN

## 2014-02-16 MED ORDER — TOPIRAMATE 50 MG PO TABS: 50.0000 mg | ORAL_TABLET | Freq: Three times a day (TID) | ORAL | Status: DC

## 2014-02-16 MED ORDER — HYDROCODONE-IBUPROFEN 7.5-200 MG PO TABS: 1.0000 | ORAL_TABLET | Freq: Four times a day (QID) | ORAL | Status: DC | PRN

## 2014-02-16 NOTE — Progress Notes (Signed)
History:  Krista Carpenter is a 48 y.o. [id number] who presents to clinic today for migraine visit. This is her yearly visit. She has been doing well for the most part, needs refills on her medications. She is still having stress with her job. Her children are doing well with the oldest preparing for graduation and becoming a fireman.   The following portions of the patient's history were reviewed and updated as appropriate: allergies, current medications, past family history, past medical history, past social history, past surgical history and problem list.  Review of Systems:    Objective:  Physical Exam BP 132/93  Pulse 64  Ht 4\' 11"  (1.499 m)  Wt 133 lb (60.328 kg)  BMI 26.85 kg/m2 GENERAL: Well-developed, well-nourished female in no acute distress.  HEENT: Normocephalic, atraumatic.  NECK: Supple. Normal thyroid.  LUNGS: Normal rate. Clear to auscultation bilaterally.  HEART: Regular rate and rhythm with no adventitious sounds.  EXTREMITIES: No cyanosis, clubbing, or edema, 2+ distal pulses.   Labs and Imaging No results found.  Assessment & Plan:  Assessment: Migraine  Plans: Refill medications Follow up one year  Krista PhenixLinda M Shelita Steptoe, NP 02/16/2014 3:12 PM

## 2014-02-16 NOTE — Patient Instructions (Signed)

## 2014-02-22 ENCOUNTER — Encounter: Payer: Self-pay | Admitting: Nurse Practitioner

## 2014-12-06 ENCOUNTER — Other Ambulatory Visit: Payer: Self-pay | Admitting: Obstetrics and Gynecology

## 2014-12-07 LAB — CYTOLOGY - PAP

## 2015-01-19 ENCOUNTER — Other Ambulatory Visit (HOSPITAL_COMMUNITY): Payer: Self-pay | Admitting: Obstetrics and Gynecology

## 2015-01-19 ENCOUNTER — Ambulatory Visit (HOSPITAL_COMMUNITY)
Admission: RE | Admit: 2015-01-19 | Discharge: 2015-01-19 | Disposition: A | Discharge status: Home / Self Care | Payer: Commercial Managed Care - HMO | Source: Ambulatory Visit | Attending: Obstetrics and Gynecology | Admitting: Obstetrics and Gynecology

## 2015-01-19 DIAGNOSIS — Z1231 Encounter for screening mammogram for malignant neoplasm of breast: Secondary | ICD-10-CM | POA: Insufficient documentation

## 2015-05-29 ENCOUNTER — Emergency Department (HOSPITAL_COMMUNITY): Payer: Commercial Managed Care - HMO

## 2015-05-29 ENCOUNTER — Emergency Department (HOSPITAL_COMMUNITY)
Admission: EM | Admit: 2015-05-29 | Discharge: 2015-05-29 | Disposition: A | Discharge status: Home / Self Care | Payer: Commercial Managed Care - HMO | Attending: Emergency Medicine | Admitting: Emergency Medicine

## 2015-05-29 ENCOUNTER — Encounter (HOSPITAL_COMMUNITY): Payer: Self-pay | Admitting: Emergency Medicine

## 2015-05-29 DIAGNOSIS — Z8719 Personal history of other diseases of the digestive system: Secondary | ICD-10-CM | POA: Insufficient documentation

## 2015-05-29 DIAGNOSIS — Z79899 Other long term (current) drug therapy: Secondary | ICD-10-CM | POA: Diagnosis not present

## 2015-05-29 DIAGNOSIS — R079 Chest pain, unspecified: Secondary | ICD-10-CM | POA: Diagnosis present

## 2015-05-29 DIAGNOSIS — G43909 Migraine, unspecified, not intractable, without status migrainosus: Secondary | ICD-10-CM | POA: Insufficient documentation

## 2015-05-29 LAB — BASIC METABOLIC PANEL
Anion gap: 10 (ref 5–15)
BUN: 18 mg/dL (ref 6–20)
CALCIUM: 9.2 mg/dL (ref 8.9–10.3)
CHLORIDE: 102 mmol/L (ref 101–111)
CO2: 25 mmol/L (ref 22–32)
CREATININE: 0.8 mg/dL (ref 0.44–1.00)
Glucose, Bld: 101 mg/dL — ABNORMAL HIGH (ref 65–99)
Potassium: 4.5 mmol/L (ref 3.5–5.1)
SODIUM: 137 mmol/L (ref 135–145)

## 2015-05-29 LAB — CBC
HCT: 44.3 % (ref 36.0–46.0)
Hemoglobin: 14.8 g/dL (ref 12.0–15.0)
MCH: 30.1 pg (ref 26.0–34.0)
MCHC: 33.4 g/dL (ref 30.0–36.0)
MCV: 90 fL (ref 78.0–100.0)
PLATELETS: 292 10*3/uL (ref 150–400)
RBC: 4.92 MIL/uL (ref 3.87–5.11)
RDW: 13 % (ref 11.5–15.5)
WBC: 7.6 10*3/uL (ref 4.0–10.5)

## 2015-05-29 LAB — I-STAT TROPONIN, ED
TROPONIN I, POC: 0 ng/mL (ref 0.00–0.08)
TROPONIN I, POC: 0 ng/mL (ref 0.00–0.08)

## 2015-05-29 NOTE — ED Provider Notes (Signed)
CSN: [id number]     Arrival date & time 05/29/15  1325 History   First MD Initiated Contact with Patient 05/29/15 1500     Chief Complaint  Patient presents with  . Chest Pain     (Consider location/radiation/quality/duration/timing/severity/associated sxs/prior Treatment) Patient is a 50 y.o. female presenting with chest pain. The history is provided by the patient.  Chest Pain Pain location:  Substernal area Pain quality: pressure   Pain radiates to:  L jaw Pain radiates to the back: no   Pain severity:  Moderate Duration:  10 minutes Timing:  Rare Progression:  Resolved Chronicity:  New Context: not breathing, not eating, no intercourse and not raising an arm   Relieved by:  None tried Worsened by:  Nothing tried Ineffective treatments:  None tried Associated symptoms: no abdominal pain, no cough, no fever, no nausea, no shortness of breath, not vomiting and no weakness   Risk factors: no coronary artery disease     Past Medical History  Diagnosis Date  . Migraine   . History of abnormal cervical Pap smear   . IBS (irritable bowel syndrome)    Past Surgical History  Procedure Laterality Date  . Cholecystectomy    . Tonsillectomy     Family History  Problem Relation Age of Onset  . Cancer Mother     skin  . Diabetes Father   . Diabetes Brother    Social History  Substance Use Topics  . Smoking status: Never Smoker   . Smokeless tobacco: Never Used  . Alcohol Use: Yes     Comment: occasion   OB History    Gravida Para Term Preterm AB TAB SAB Ectopic Multiple Living   3    1  1   2      Review of Systems  Constitutional: Negative for fever and chills.  HENT: Negative for nosebleeds.   Eyes: Negative for visual disturbance.  Respiratory: Negative for cough and shortness of breath.   Cardiovascular: Positive for chest pain.  Gastrointestinal: Negative for nausea, vomiting, abdominal pain, diarrhea and constipation.  Genitourinary: Negative for dysuria.   Skin: Negative for rash.  Neurological: Negative for weakness.  All other systems reviewed and are negative.     Allergies  Review of patient's allergies indicates no known allergies.  Home Medications   Prior to Admission medications   Medication Sig Start Date End Date Taking? Authorizing Provider  ALPRAZolam Prudy Feeler) 0.5 MG tablet Take 1 tablet (0.5 mg total) by mouth 3 (three) times daily as needed for sleep. 02/16/14  Yes Delbert Phenix, NP  orphenadrine (NORFLEX) 100 MG tablet Take 1 tablet (100 mg total) by mouth 2 (two) times daily as needed for muscle spasms. 02/16/14  Yes Delbert Phenix, NP  promethazine (PHENERGAN) 25 MG tablet Take 1 tablet (25 mg total) by mouth every 6 (six) hours as needed for nausea. 02/16/14 05/29/15 Yes Delbert Phenix, NP  pseudoephedrine (SUDAFED) 30 MG tablet Take 30 mg by mouth every 4 (four) hours as needed for congestion.   Yes Historical Provider, MD  rizatriptan (MAXALT) 10 MG tablet Take 1 tablet (10 mg total) by mouth once as needed for migraine. May repeat in 2 hours if needed 02/16/14 05/29/15 Yes Delbert Phenix, NP  sertraline (ZOLOFT) 100 MG tablet Take 1 tablet (100 mg total) by mouth 2 (two) times daily. Patient taking differently: Take 200 mg by mouth daily.  02/16/14  Yes Delbert Phenix, NP  HYDROcodone-ibuprofen (VICOPROFEN) 7.5-200 MG  per tablet Take 1 tablet by mouth every 6 (six) hours as needed for moderate pain. Patient not taking: Reported on 05/29/2015 02/16/14   Delbert Phenix, NP  predniSONE (DELTASONE) 20 MG tablet Take 4 tablets on day one, 3 tablets on day 2,  2 tablets on day 3 and 1 tablet on day 4. Patient not taking: Reported on 05/29/2015 10/09/13   Delbert Phenix, NP  topiramate (TOPAMAX) 50 MG tablet Take 1 tablet (50 mg total) by mouth 3 (three) times daily. 02/16/14 02/16/15  Delbert Phenix, NP   BP 138/87 mmHg  Pulse 72  Temp(Src) 98.1 F (36.7 C) (Oral)  Resp 21  Ht  (1.499 m)  Wt 63.504 kg   BMI 28.26 kg/m2  SpO2 96% Physical Exam  Constitutional: She is oriented to person, place, and time. No distress.  HENT:  Head: Normocephalic and atraumatic.  Eyes: EOM are normal. Pupils are equal, round, and reactive to light.  Neck: Normal range of motion. Neck supple.  Cardiovascular: Normal rate and intact distal pulses.   Pulmonary/Chest: No respiratory distress.  Abdominal: Soft. She exhibits no distension. There is no tenderness. There is no rebound and no guarding.  Musculoskeletal: Normal range of motion.  Neurological: She is alert and oriented to person, place, and time.  Skin: No rash noted. She is not diaphoretic.  Psychiatric: She has a normal mood and affect.    ED Course  Procedures (including critical care time) Labs Review Labs Reviewed  BASIC METABOLIC PANEL - Abnormal; Notable for the following:    Glucose, Bld 101 (*)    All other components within normal limits  CBC  I-STAT TROPOININ, ED  Rosezena Sensor, ED    Imaging Review Dg Chest 2 View  05/29/2015  CLINICAL DATA:  Acute chest pain. EXAM: CHEST  2 VIEW COMPARISON:  None. FINDINGS: The heart size and mediastinal contours are within normal limits. Both lungs are clear. No pneumothorax or pleural effusion is noted. The visualized skeletal structures are unremarkable. IMPRESSION: No active cardiopulmonary disease. Electronically Signed   By: Lupita Raider, M.D.   On: 05/29/2015 14:53   I have personally reviewed and evaluated these images and lab results as part of my medical decision-making.   EKG Interpretation   Date/Time:   y f w no sig PMH comes in after an episode of chest pain.  The chest pain lasted for 10 minutes, pressure like, central and radiated to her L arm and jaw.  She has never had chest pain before.  The pain resolved on its own.    Exam unremarkable. ekg w/ inverted t wave in lead II, some flattening in the lateral leads. Chest pain free, doubt pe, doubt dissection cxr wnl, doubt PTX Cbc/bmp unremarkable. First trop neg.  HEAR score 4.  After shared decision making the patient has decided she would like to f/u closely with cards rather than be admitted for stress.  I feel this is reasonable if she is able to obtain f/u this week.  I have discussed the results, Dx and Tx plan with the pt. They expressed understanding and agree with the plan and were told to return to ED with any worsening of condition or concern.    Disposition: Discharge  Condition: Good  New Prescriptions   No medications on file    Follow Up: [facility] [address] [place] [address] [phone] In 1 day    Pt seen in conjunction with Dr. Nelda Bucks, MD 05/29/15 1831  Blane Ohara, MD 05/30/15 [id number]

## 2015-05-29 NOTE — ED Notes (Signed)
Pt reports she had a sudden onset of midsternal chest pressure that radiated into back and left jaw.  Initially pain was a 6/10.  Pt was diaphoretic and dizzy during episode.  Pain has since eased off to a 1/10 and pt sts "I just feels like I've overexerted myself."  Pt was cooking breakfast when episode occurred.

## 2015-05-29 NOTE — ED Notes (Addendum)
To ED with c/o chest pain that started around 1130 this am-- no hx of same-- radiates to back, neck , jaw,   Took ASA  -- helped with pain

## 2015-05-29 NOTE — Discharge Instructions (Signed)

## 2015-05-31 ENCOUNTER — Encounter: Payer: Self-pay | Admitting: Primary Care

## 2015-05-31 ENCOUNTER — Ambulatory Visit (INDEPENDENT_AMBULATORY_CARE_PROVIDER_SITE_OTHER): Payer: Commercial Managed Care - HMO | Admitting: Primary Care

## 2015-05-31 ENCOUNTER — Encounter: Payer: Self-pay | Admitting: *Deleted

## 2015-05-31 VITALS — BP 124/84 | HR 82 | Temp 97.9°F | Ht 59.5 in | Wt 145.0 lb

## 2015-05-31 DIAGNOSIS — F411 Generalized anxiety disorder: Secondary | ICD-10-CM

## 2015-05-31 DIAGNOSIS — F4323 Adjustment disorder with mixed anxiety and depressed mood: Secondary | ICD-10-CM | POA: Insufficient documentation

## 2015-05-31 DIAGNOSIS — G43901 Migraine, unspecified, not intractable, with status migrainosus: Secondary | ICD-10-CM

## 2015-05-31 NOTE — Assessment & Plan Note (Signed)
Longstanding history of. Once managed by headaches specialist, has not seen in years. Managed on Zoloft 200 mg daily, norflex PRN, Maxalt PRN. Will continue current regimen and consider evaluation with specialist if symptoms no longer managed well on current regimen.

## 2015-05-31 NOTE — Progress Notes (Signed)
Subjective:    Patient ID: Krista Carpenter, female    DOB: [birth year], 50 y.o.   MRN: [id number]  HPI  [name] is a 50 year old female who presents today to establish care and discuss the problems mentioned below. Will obtain old records. Her last physical was several years ago.  1) Emergency Department Follow Up: Presented to [facility] on 05/29/15 with a chief complaint of chest pain. She developed a sudden onset of chest pain that lasted for about 10 minutes and was located sub sternally with radiation down her left arm and jaw.   She underwent ECG which showed inverted t-wave in lead II, some flattening of the lateral leads. She underwent chest xray which was unremarkable, and CBC, CMP, Troponin which were all negative.   She was discharged with instructions to follow up with cardiology for further evaluation. She is to schedule an appointment to see cardiology soon for stress test and evaluation. Since her dishcarge she's not experienced any chest pain, shortness of breath, dizziness.  2) Anxiety and Migraines: Long standing history of migraines and frequent headaches. Also with some anxiety that was thought to be causing headaches. She was in a stressful job at one point that caused anxiety that lead to headaches.   She was once seeing a nurse practitioner who specialized in headache management for treatment. She is currently managed on Zoloft 200 mg every morning which has provided improvement. She will take alprazolam 0.5 mg several times weekly as needed for anxiety. She also has a prescription for Maxalt for severe migraines and will require this medication once montthly on average. She is also managed on norflex 100 mg PRN. When headaches are not improved with the regimen she is currently on, including Maxalt, then prednisone has worked in the past as a last resort. GAD 7 score of 2 today.   Feels well managed on this medication regimen.  She's completed trigger point injections and  botox injections in the past without success.   3) Hyperlipidemia: History of in the past. Believes her triglycerides were elevated. She is currently not taking any medication. Unsure of last lipid panel.  Review of Systems  Respiratory: Negative for shortness of breath.   Cardiovascular: Negative for chest pain.  Gastrointestinal: Negative for nausea.  Neurological: Positive for headaches. Negative for dizziness.  Psychiatric/Behavioral: The patient is not nervous/anxious.        Past Medical History  Diagnosis Date  . Migraine   . History of abnormal cervical Pap smear   . IBS (irritable bowel syndrome)     Social History   Social History  . Marital Status: Married    Spouse Name: N/A  . Number of Children: N/A  . Years of Education: N/A   Occupational History  . Not on file.   Social History Main Topics  . Smoking status: Never Smoker   . Smokeless tobacco: Never Used  . Alcohol Use: 0.0 oz/week    0 Standard drinks or equivalent per week     Comment: occasion  . Drug Use: No  . Sexual Activity:    Partners: Male    Birth Control/ Protection: IUD   Other Topics Concern  . Not on file   Social History Narrative   Married.   2 children.   Works at [employer].   Enjoys scrapbooking, reading.    Past Surgical History  Procedure Laterality Date  . Cholecystectomy    . Tonsillectomy      Family  History  Problem Relation Age of Onset  . Cancer Mother     skin  . Diabetes Father   . Diabetes Brother     No Known Allergies  Current Outpatient Prescriptions on File Prior to Visit  Medication Sig Dispense Refill  . ALPRAZolam (XANAX) 0.5 MG tablet Take 1 tablet (0.5 mg total) by mouth 3 (three) times daily as needed for sleep. 30 tablet 0  . orphenadrine (NORFLEX) 100 MG tablet Take 1 tablet (100 mg total) by mouth 2 (two) times daily as needed for muscle spasms. 60 tablet 1  . rizatriptan (MAXALT) 10 MG tablet Take 1 tablet (10 mg total) by mouth  once as needed for migraine. May repeat in 2 hours if needed 12 tablet 11  . sertraline (ZOLOFT) 100 MG tablet Take 1 tablet (100 mg total) by mouth 2 (two) times daily. (Patient taking differently: Take 200 mg by mouth daily. ) 60 tablet 11  . HYDROcodone-ibuprofen (VICOPROFEN) 7.5-200 MG per tablet Take 1 tablet by mouth every 6 (six) hours as needed for moderate pain. (Patient not taking: Reported on 05/29/2015) 30 tablet 0  . promethazine (PHENERGAN) 25 MG tablet Take 1 tablet (25 mg total) by mouth every 6 (six) hours as needed for nausea. (Patient not taking: Reported on 05/31/2015) 30 tablet 1  . pseudoephedrine (SUDAFED) 30 MG tablet Take 30 mg by mouth every 4 (four) hours as needed for congestion. Reported on 05/31/2015     No current facility-administered medications on file prior to visit.    BP 124/84 mmHg  Pulse 82  Temp(Src) 97.9 F (36.6 C) (Oral)  Ht 4' 11.5" (1.511 m)  Wt 145 lb (65.772 kg)  BMI 28.81 kg/m2  SpO2 98%    Objective:   Physical Exam  Constitutional: She appears well-nourished.  Cardiovascular: Normal rate and regular rhythm.   Pulmonary/Chest: Effort normal and breath sounds normal.  Skin: Skin is warm and dry.  Psychiatric: She has a normal mood and affect.          Assessment & Plan:  ED Follow Up:  Chest pain, sudden onset. Evaluated 05/29/15. All labs and testing unremarkable. She will schedule follow up with cardiology for stress test as recommenced by ED physician. All labs, imaging, ECG, and notes reviewed from ED visit.

## 2015-05-31 NOTE — Patient Instructions (Signed)
Stop by the lab to complete the contract and urine drug screen.  Please schedule a physical with me within the next 3-6 months. You may also schedule a lab only appointment 3-4 days prior. We will discuss your lab results in detail during your physical.  It was a pleasure to meet you today! Please don't hesitate to call me with any questions. Welcome to [facility]!

## 2015-05-31 NOTE — Assessment & Plan Note (Signed)
Unsure of formal diagnosis, but she is managed on Zoloft daily and alprazolam PRN. Suspect anxiety and stress from prior occupation was part of the cause of migraines. Will continue current regimen. UDS and controlled substance contract obtained.

## 2015-05-31 NOTE — Progress Notes (Signed)
Pre visit review using our clinic review tool, if applicable. No additional management support is needed unless otherwise documented below in the visit note. 

## 2015-06-07 ENCOUNTER — Ambulatory Visit: Payer: Commercial Managed Care - HMO | Admitting: Primary Care

## 2015-06-17 ENCOUNTER — Encounter: Payer: Self-pay | Admitting: Primary Care

## 2015-12-09 ENCOUNTER — Institutional Professional Consult (permissible substitution): Payer: Commercial Managed Care - HMO | Admitting: Physician Assistant

## 2016-05-10 ENCOUNTER — Encounter: Payer: Commercial Managed Care - HMO | Admitting: Primary Care

## 2016-08-02 DIAGNOSIS — R87612 Low grade squamous intraepithelial lesion on cytologic smear of cervix (LGSIL): Secondary | ICD-10-CM | POA: Diagnosis not present

## 2016-08-02 DIAGNOSIS — Z1231 Encounter for screening mammogram for malignant neoplasm of breast: Secondary | ICD-10-CM | POA: Diagnosis not present

## 2016-08-02 DIAGNOSIS — R87610 Atypical squamous cells of undetermined significance on cytologic smear of cervix (ASC-US): Secondary | ICD-10-CM | POA: Diagnosis not present

## 2017-01-17 DIAGNOSIS — G5601 Carpal tunnel syndrome, right upper limb: Secondary | ICD-10-CM | POA: Diagnosis not present

## 2017-01-17 DIAGNOSIS — G5602 Carpal tunnel syndrome, left upper limb: Secondary | ICD-10-CM | POA: Diagnosis not present

## 2017-01-17 DIAGNOSIS — M79641 Pain in right hand: Secondary | ICD-10-CM | POA: Diagnosis not present

## 2017-01-21 DIAGNOSIS — G5601 Carpal tunnel syndrome, right upper limb: Secondary | ICD-10-CM | POA: Diagnosis not present

## 2017-01-21 DIAGNOSIS — G5602 Carpal tunnel syndrome, left upper limb: Secondary | ICD-10-CM | POA: Diagnosis not present

## 2017-01-28 DIAGNOSIS — G5601 Carpal tunnel syndrome, right upper limb: Secondary | ICD-10-CM | POA: Diagnosis not present

## 2017-03-05 DIAGNOSIS — Z01419 Encounter for gynecological examination (general) (routine) without abnormal findings: Secondary | ICD-10-CM | POA: Diagnosis not present

## 2017-03-26 DIAGNOSIS — G5601 Carpal tunnel syndrome, right upper limb: Secondary | ICD-10-CM | POA: Diagnosis not present

## 2017-04-02 DIAGNOSIS — Z4789 Encounter for other orthopedic aftercare: Secondary | ICD-10-CM | POA: Diagnosis not present

## 2017-04-09 DIAGNOSIS — M25631 Stiffness of right wrist, not elsewhere classified: Secondary | ICD-10-CM | POA: Diagnosis not present

## 2017-04-24 DIAGNOSIS — M65311 Trigger thumb, right thumb: Secondary | ICD-10-CM | POA: Diagnosis not present

## 2017-04-24 DIAGNOSIS — G5603 Carpal tunnel syndrome, bilateral upper limbs: Secondary | ICD-10-CM | POA: Diagnosis not present

## 2017-04-24 DIAGNOSIS — G5602 Carpal tunnel syndrome, left upper limb: Secondary | ICD-10-CM | POA: Diagnosis not present

## 2017-05-01 ENCOUNTER — Ambulatory Visit: Payer: Commercial Managed Care - HMO | Admitting: Podiatry

## 2017-05-01 ENCOUNTER — Encounter: Payer: Self-pay | Admitting: Podiatry

## 2017-05-01 ENCOUNTER — Ambulatory Visit (INDEPENDENT_AMBULATORY_CARE_PROVIDER_SITE_OTHER): Payer: Commercial Managed Care - HMO

## 2017-05-01 DIAGNOSIS — M722 Plantar fascial fibromatosis: Secondary | ICD-10-CM

## 2017-05-01 MED ORDER — METHYLPREDNISOLONE 4 MG PO TBPK: ORAL_TABLET | ORAL | 0 refills | Status: DC

## 2017-05-01 MED ORDER — MELOXICAM 15 MG PO TABS: 15.0000 mg | ORAL_TABLET | Freq: Every day | ORAL | 3 refills | Status: DC

## 2017-05-01 NOTE — Progress Notes (Signed)
She presents today with a chief complaint of right heel pain.  States is been aching for the past couple of months feels like pins and needles sensation in the morning and she is tried over-the-counter anti-inflammatory cream to no avail.  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  States that she has morning pain and after she has been sitting for a while.  Objective: Signs are stable alert and oriented x3.  Pulses are palpable.  Neurologic sensorium is intact.  Deep tendon reflexes are intact.  Muscle strength was 5/5 dorsiflexors plantar flexors inverters everters all intrinsic musculature is intact.  Repeat evaluation demonstrates pain on palpation medial calcaneal tubercle of the right heel.  Radiographs taken today demonstrate a plantar distally oriented calcaneal heel spur with soft tissue increase in density at the plantar fascial calcaneal insertion site.  No open lesions or wounds are noted.  Slight abrasion to the dorsal aspect of the right foot.  Does not appear to be infected.  Assessment: Plantar fasciitis right foot.  Plan: Discussed etiology pathology conservative versus surgical therapies.  Injected her right heel today after sterile Betadine skin prep and verbal confirmation with 20 mg of Kenalog 5 mg Marcaine to the point of maximal tenderness medial aspect of the heel at its insertion site.  She tolerated procedure well without complications.  Started on a Medrol Dosepak to be followed by meloxicam.  I also placed her in a plantar fascial brace to be followed by a night splint.  We discussed appropriate shoe gear stretching exercises ice therapy and shoe modifications.  I will follow-up with her in 1 month.  I went to middle school with her.  Her last name was stone.

## 2017-05-01 NOTE — Patient Instructions (Signed)

## 2017-05-07 DIAGNOSIS — Z01419 Encounter for gynecological examination (general) (routine) without abnormal findings: Secondary | ICD-10-CM | POA: Diagnosis not present

## 2017-05-22 DIAGNOSIS — G5601 Carpal tunnel syndrome, right upper limb: Secondary | ICD-10-CM | POA: Diagnosis not present

## 2017-05-22 DIAGNOSIS — M65311 Trigger thumb, right thumb: Secondary | ICD-10-CM | POA: Diagnosis not present

## 2017-05-22 DIAGNOSIS — G5602 Carpal tunnel syndrome, left upper limb: Secondary | ICD-10-CM | POA: Diagnosis not present

## 2017-06-05 ENCOUNTER — Ambulatory Visit: Payer: Commercial Managed Care - HMO | Admitting: Podiatry

## 2017-10-14 ENCOUNTER — Encounter: Payer: Self-pay | Admitting: Podiatry

## 2017-10-14 ENCOUNTER — Ambulatory Visit: Payer: 59 | Admitting: Podiatry

## 2017-10-14 DIAGNOSIS — M722 Plantar fascial fibromatosis: Secondary | ICD-10-CM

## 2017-10-14 MED ORDER — CELECOXIB 200 MG PO CAPS: 200.0000 mg | ORAL_CAPSULE | Freq: Every day | ORAL | 3 refills | Status: DC

## 2017-10-14 NOTE — Progress Notes (Signed)
She presents today for follow-up right heel pain.  States that is still hurting in the left foot is starting to act up as well.  Meloxicam does not seem to be working for me.  Objective: Signs are stable alert and oriented x3.  Pulses are palpable.  Neurologic sensorium is intact.  Degenerative flexors are intact.  She has pain on palpation medial calcaneal tubercles right foot pain on palpation medial longitudinal arch left foot.  Assessment: Pain in limb secondary to plantar fasciitis bilateral.  Plan: Reinjected the right heel today with 20 mg Kenalog 5 mg Marcaine point maximal tenderness right foot malleolar foot.  Started her on Celebrex 200 mg once a day for 30 days we will follow-up with her in 1 month she will also see Raiford NobleRick this week for orthotics.

## 2017-10-18 ENCOUNTER — Other Ambulatory Visit: Payer: 59 | Admitting: Orthotics

## 2017-10-21 ENCOUNTER — Ambulatory Visit (INDEPENDENT_AMBULATORY_CARE_PROVIDER_SITE_OTHER): Payer: 59 | Admitting: Orthotics

## 2017-10-21 DIAGNOSIS — M722 Plantar fascial fibromatosis: Secondary | ICD-10-CM

## 2017-10-21 NOTE — Progress Notes (Signed)

## 2017-11-21 ENCOUNTER — Ambulatory Visit (INDEPENDENT_AMBULATORY_CARE_PROVIDER_SITE_OTHER): Payer: 59 | Admitting: Podiatry

## 2017-11-21 ENCOUNTER — Ambulatory Visit: Payer: 59 | Admitting: Orthotics

## 2017-11-21 ENCOUNTER — Encounter: Payer: Self-pay | Admitting: Podiatry

## 2017-11-21 ENCOUNTER — Ambulatory Visit: Payer: 59

## 2017-11-21 DIAGNOSIS — M722 Plantar fascial fibromatosis: Secondary | ICD-10-CM

## 2017-11-21 DIAGNOSIS — M25571 Pain in right ankle and joints of right foot: Secondary | ICD-10-CM

## 2017-11-21 NOTE — Progress Notes (Signed)
Patient came in today to pick up custom made foot orthotics.  The goals were accomplished and the patient reported no dissatisfaction with said orthotics.  Patient was advised of breakin period and how to report any issues. 

## 2017-11-21 NOTE — Progress Notes (Signed)
Patient presents today for follow up of her plantar fasciitis. She states that they are still bothersome at times, but better than they were. Rick dispensed orthotics and fit was satisfactory and instructions for wear were reviewed. Patient will follow up in 6 weeks if she is no better.

## 2017-11-21 NOTE — Patient Instructions (Signed)

## 2018-04-17 ENCOUNTER — Other Ambulatory Visit: Payer: Self-pay | Admitting: Primary Care

## 2018-04-17 DIAGNOSIS — Z0000 Encounter for general adult medical examination without abnormal findings: Secondary | ICD-10-CM

## 2018-04-25 ENCOUNTER — Other Ambulatory Visit: Payer: 59

## 2018-04-28 ENCOUNTER — Encounter: Payer: 59 | Admitting: Primary Care

## 2018-05-13 ENCOUNTER — Other Ambulatory Visit (INDEPENDENT_AMBULATORY_CARE_PROVIDER_SITE_OTHER): Payer: 59

## 2018-05-13 DIAGNOSIS — Z0000 Encounter for general adult medical examination without abnormal findings: Secondary | ICD-10-CM | POA: Diagnosis not present

## 2018-05-13 LAB — COMPREHENSIVE METABOLIC PANEL
ALT: 15 U/L (ref 0–35)
AST: 15 U/L (ref 0–37)
Albumin: 4.5 g/dL (ref 3.5–5.2)
Alkaline Phosphatase: 45 U/L (ref 39–117)
BUN: 20 mg/dL (ref 6–23)
CO2: 28 meq/L (ref 19–32)
Calcium: 9.6 mg/dL (ref 8.4–10.5)
Chloride: 103 mEq/L (ref 96–112)
Creatinine, Ser: 0.91 mg/dL (ref 0.40–1.20)
GFR: 65.11 mL/min (ref >60.00)
GLUCOSE: 96 mg/dL (ref 70–99)
POTASSIUM: 4.1 meq/L (ref 3.5–5.1)
SODIUM: 139 meq/L (ref 135–145)
Total Bilirubin: 0.4 mg/dL (ref 0.2–1.2)
Total Protein: 6.9 g/dL (ref 6.0–8.3)

## 2018-05-13 LAB — LIPID PANEL
Cholesterol: 205 mg/dL — ABNORMAL HIGH (ref 0–200)
HDL: 49 mg/dL (ref >39.00)
NonHDL: 155.98
Total CHOL/HDL Ratio: 4
Triglycerides: 254 mg/dL — ABNORMAL HIGH (ref 0.0–149.0)
VLDL: 50.8 mg/dL — ABNORMAL HIGH (ref 0.0–40.0)

## 2018-05-13 LAB — HEMOGLOBIN A1C: HEMOGLOBIN A1C: 5.8 % (ref 4.6–6.5)

## 2018-05-13 LAB — LDL CHOLESTEROL, DIRECT: Direct LDL: 129 mg/dL

## 2018-05-20 ENCOUNTER — Encounter: Payer: Self-pay | Admitting: Primary Care

## 2018-05-20 ENCOUNTER — Ambulatory Visit (INDEPENDENT_AMBULATORY_CARE_PROVIDER_SITE_OTHER): Payer: 59 | Admitting: Primary Care

## 2018-05-20 VITALS — BP 124/84 | HR 85 | Temp 98.3°F | Ht 59.5 in | Wt 156.0 lb

## 2018-05-20 DIAGNOSIS — Z23 Encounter for immunization: Secondary | ICD-10-CM

## 2018-05-20 DIAGNOSIS — F411 Generalized anxiety disorder: Secondary | ICD-10-CM | POA: Diagnosis not present

## 2018-05-20 DIAGNOSIS — Z1211 Encounter for screening for malignant neoplasm of colon: Secondary | ICD-10-CM

## 2018-05-20 DIAGNOSIS — G43901 Migraine, unspecified, not intractable, with status migrainosus: Secondary | ICD-10-CM | POA: Diagnosis not present

## 2018-05-20 DIAGNOSIS — R7303 Prediabetes: Secondary | ICD-10-CM

## 2018-05-20 DIAGNOSIS — E785 Hyperlipidemia, unspecified: Secondary | ICD-10-CM | POA: Insufficient documentation

## 2018-05-20 DIAGNOSIS — Z0000 Encounter for general adult medical examination without abnormal findings: Secondary | ICD-10-CM | POA: Diagnosis not present

## 2018-05-20 DIAGNOSIS — Z0001 Encounter for general adult medical examination with abnormal findings: Secondary | ICD-10-CM | POA: Insufficient documentation

## 2018-05-20 NOTE — Assessment & Plan Note (Signed)
Tetanus due, provided today. Influenza vaccination UTD. Colonoscopy due, orders placed. Pap smear and Mammogram UTD. Recommended regular exercise and healthy diet. Exam unremarkable. Labs reviewed.  Follow up in 1 year for CPE.

## 2018-05-20 NOTE — Assessment & Plan Note (Signed)
Overall improved, not currently on treatment. Last migraine was about 6 months ago. Continue to monitor.

## 2018-05-20 NOTE — Addendum Note (Signed)
Addended by: Tawnya Crook on: 05/20/2018 04:55 PM   Modules accepted: Orders

## 2018-05-20 NOTE — Assessment & Plan Note (Signed)
Recent A1C of 5.8, discussed to work on a healthy diet and start exercising. Continue to monitor. Repeat in 6 months.

## 2018-05-20 NOTE — Progress Notes (Signed)
Subjective:    Patient ID: Krista Carpenter, female    DOB: [birth year], 53 y.o.   MRN: [id number]  HPI  [name] is a 53 year old female who presents today for complete physical.  Immunizations: -Tetanus: Unsure  -Influenza: Completed this season    Diet: She endorses a fair diet Breakfast: Cereal, toast Lunch: Sandwich, fast food and homemade  Dinner: Public affairs consultantestaurants, meat/vegetable/starch Snacks: Popcorn, cookies, cake Desserts: Daily  Beverages: Water, some coffee, soda  Exercise: She is not exercising  Eye exam: Completed in 2020 Dental exam: Completes annually  Colonoscopy: Completed in early 40's.  Pap Smear: Completed 2019 per GYN Mammogram: Completed in 2019, November  BP Readings from Last 3 Encounters:  05/20/18 124/84  05/31/15 124/84  05/29/15 120/89   The 10-year ASCVD risk score Denman George(Krista Carpenter) is: 1.5%   Values used to calculate the score:     Age: 1950 years     Sex: Female     Is Non-Hispanic African American: No     Diabetic: No     Tobacco smoker: No     Systolic Blood Pressure: 124 mmHg     Is BP treated: No     HDL Cholesterol: 49 mg/dL     Total Cholesterol: 205 mg/dL   Review of Systems  Constitutional: Negative for unexpected weight change.  HENT: Negative for rhinorrhea.   Respiratory: Negative for cough and shortness of breath.   Cardiovascular: Negative for chest pain.  Gastrointestinal: Negative for constipation and diarrhea.  Genitourinary: Negative for difficulty urinating.  Musculoskeletal: Negative for arthralgias and myalgias.  Skin: Negative for rash.  Allergic/Immunologic: Negative for environmental allergies.  Neurological: Negative for dizziness, numbness and headaches.  Psychiatric/Behavioral: The patient is not nervous/anxious.        Past Medical History:  Diagnosis Date  . History of abnormal cervical Pap smear   . IBS (irritable bowel syndrome)   . Migraine      Social History   Socioeconomic  History  . Marital status: Married    Spouse name: Not on file  . Number of children: Not on file  . Years of education: Not on file  . Highest education level: Not on file  Occupational History  . Not on file  Social Needs  . Financial resource strain: Not on file  . Food insecurity:    Worry: Not on file    Inability: Not on file  . Transportation needs:    Medical: Not on file    Non-medical: Not on file  Tobacco Use  . Smoking status: Never Smoker  . Smokeless tobacco: Never Used  Substance and Sexual Activity  . Alcohol use: Yes    Alcohol/week: 0.0 standard drinks    Comment: occasion  . Drug use: No  . Sexual activity: Yes    Partners: Male    Birth control/protection: I.U.D.  Lifestyle  . Physical activity:    Days per week: Not on file    Minutes per session: Not on file  . Stress: Not on file  Relationships  . Social connections:    Talks on phone: Not on file    Gets together: Not on file    Attends religious service: Not on file    Active member of club or organization: Not on file    Attends meetings of clubs or organizations: Not on file    Relationship status: Not on file  . Intimate partner violence:    Fear  of current or ex partner: Not on file    Emotionally abused: Not on file    Physically abused: Not on file    Forced sexual activity: Not on file  Other Topics Concern  . Not on file  Social History Narrative   Married.   2 children.   Works at [employer].   Enjoys scrapbooking, reading.    Past Surgical History:  Procedure Laterality Date  . CHOLECYSTECTOMY    . TONSILLECTOMY      Family History  Problem Relation Age of Onset  . Cancer Mother        skin  . Diabetes Father   . Diabetes Brother     No Known Allergies  Current Outpatient Medications on File Prior to Visit  Medication Sig Dispense Refill  . ALPRAZolam (XANAX) 0.5 MG tablet Take 1 tablet (0.5 mg total) by mouth 3 (three) times daily as needed for sleep. 30  tablet 0  . sertraline (ZOLOFT) 100 MG tablet Take 1 tablet (100 mg total) by mouth 2 (two) times daily. (Patient taking differently: Take 200 mg by mouth daily. ) 60 tablet 11  . zolpidem (AMBIEN) 5 MG tablet   1   No current facility-administered medications on file prior to visit.     BP 124/84   Pulse 85   Temp 98.3 F (36.8 C) (Oral)   Ht 4' 11.5" (1.511 m)   Wt 156 lb (70.8 kg)   LMP 12/02/2012   SpO2 98%   BMI 30.98 kg/m    Objective:   Physical Exam  Constitutional: She is oriented to person, place, and time. She appears well-nourished.  HENT:  Mouth/Throat: No oropharyngeal exudate.  Eyes: Pupils are equal, round, and reactive to light. EOM are normal.  Neck: Neck supple. No thyromegaly present.  Cardiovascular: Normal rate and regular rhythm.  Respiratory: Effort normal and breath sounds normal.  GI: Soft. Bowel sounds are normal. There is no abdominal tenderness.  Musculoskeletal: Normal range of motion.  Neurological: She is alert and oriented to person, place, and time.  Skin: Skin is warm and dry.  Psychiatric: She has a normal mood and affect.           Assessment & Plan:

## 2018-05-20 NOTE — Assessment & Plan Note (Signed)
Trigs above goal, LDL borderline. Recommended to work on diet and exercise. Continue to monitor.

## 2018-05-20 NOTE — Patient Instructions (Signed)
Start exercising. You should be getting 150 minutes of moderate intensity exercise weekly.  It's important to improve your diet by reducing consumption of fast food, fried food, processed snack foods, sugary drinks. Increase consumption of fresh vegetables and fruits, whole grains, water.  Ensure you are drinking 64 ounces of water daily.  You will be contacted regarding your referral to GI for the colonoscopy and also to the nutritionist.  Please let us know if you have not been contacted within one week.   You were provided with a tetanus vaccination which will cover you for 10 years.  Schedule a lab only appointment for 6 months to repeat your prediabetes test.  It was a pleasure to see you today!   Preventive Care 40-64 Years, Female Preventive care refers to lifestyle choices and visits with your health care provider that can promote health and wellness. What does preventive care include?   A yearly physical exam. This is also called an annual well check.  Dental exams once or twice a year.  Routine eye exams. Ask your health care provider how often you should have your eyes checked.  Personal lifestyle choices, including: ? Daily care of your teeth and gums. ? Regular physical activity. ? Eating a healthy diet. ? Avoiding tobacco and drug use. ? Limiting alcohol use. ? Practicing safe sex. ? Taking low-dose aspirin daily starting at age 66. ? Taking vitamin and mineral supplements as recommended by your health care provider. What happens during an annual well check? The services and screenings done by your health care provider during your annual well check will depend on your age, overall health, lifestyle risk factors, and family history of disease. Counseling Your health care provider may ask you questions about your:  Alcohol use.  Tobacco use.  Drug use.  Emotional well-being.  Home and relationship well-being.  Sexual activity.  Eating habits.  Work  and work Statistician.  Method of birth control.  Menstrual cycle.  Pregnancy history. Screening You may have the following tests or measurements:  Height, weight, and BMI.  Blood pressure.  Lipid and cholesterol levels. These may be checked every 5 years, or more frequently if you are over 59 years old.  Skin check.  Lung cancer screening. You may have this screening every year starting at age 103 if you have a 30-pack-year history of smoking and currently smoke or have quit within the past 15 years.  Colorectal cancer screening. All adults should have this screening starting at age 80 and continuing until age 18. Your health care provider may recommend screening at age 97. You will have tests every 1-10 years, depending on your results and the type of screening test. People at increased risk should start screening at an earlier age. Screening tests may include: ? Guaiac-based fecal occult blood testing. ? Fecal immunochemical test (FIT). ? Stool DNA test. ? Virtual colonoscopy. ? Sigmoidoscopy. During this test, a flexible tube with a tiny camera (sigmoidoscope) is used to examine your rectum and lower colon. The sigmoidoscope is inserted through your anus into your rectum and lower colon. ? Colonoscopy. During this test, a long, thin, flexible tube with a tiny camera (colonoscope) is used to examine your entire colon and rectum.  Hepatitis C blood test.  Hepatitis B blood test.  Sexually transmitted disease (STD) testing.  Diabetes screening. This is done by checking your blood sugar (glucose) after you have not eaten for a while (fasting). You may have this done every 1-3 years.  Mammogram. This may be done every 1-2 years. Talk to your health care provider about when you should start having regular mammograms. This may depend on whether you have a family history of breast cancer.  BRCA-related cancer screening. This may be done if you have a family history of breast, ovarian,  tubal, or peritoneal cancers.  Pelvic exam and Pap test. This may be done every 3 years starting at age 66. Starting at age 39, this may be done every 5 years if you have a Pap test in combination with an HPV test.  Bone density scan. This is done to screen for osteoporosis. You may have this scan if you are at high risk for osteoporosis. Discuss your test results, treatment options, and if necessary, the need for more tests with your health care provider. Vaccines Your health care provider may recommend certain vaccines, such as:  Influenza vaccine. This is recommended every year.  Tetanus, diphtheria, and acellular pertussis (Tdap, Td) vaccine. You may need a Td booster every 10 years.  Varicella vaccine. You may need this if you have not been vaccinated.  Zoster vaccine. You may need this after age 61.  Measles, mumps, and rubella (MMR) vaccine. You may need at least one dose of MMR if you were born in 1957 or later. You may also need a second dose.  Pneumococcal 13-valent conjugate (PCV13) vaccine. You may need this if you have certain conditions and were not previously vaccinated.  Pneumococcal polysaccharide (PPSV23) vaccine. You may need one or two doses if you smoke cigarettes or if you have certain conditions.  Meningococcal vaccine. You may need this if you have certain conditions.  Hepatitis A vaccine. You may need this if you have certain conditions or if you travel or work in places where you may be exposed to hepatitis A.  Hepatitis B vaccine. You may need this if you have certain conditions or if you travel or work in places where you may be exposed to hepatitis B.  Haemophilus influenzae type b (Hib) vaccine. You may need this if you have certain conditions. Talk to your health care provider about which screenings and vaccines you need and how often you need them. This information is not intended to replace advice given to you by your health care provider. Make sure you  discuss any questions you have with your health care provider. Document Released: 05/06/2015 Document Revised: 05/30/2017 Document Reviewed: 02/08/2015 Elsevier Interactive Patient Education  2019 [employer].

## 2018-05-20 NOTE — Assessment & Plan Note (Addendum)
Compliant to Zoloft 100 mg daily, doing well. Using Ambien 2.5 nightly on average.  Not using alprazolam, discouraged use.  Continue current regimen. Denies SI/HI.

## 2018-07-07 ENCOUNTER — Encounter: Payer: Self-pay | Admitting: Dietician

## 2018-07-07 ENCOUNTER — Encounter: Payer: 59 | Attending: Primary Care | Admitting: Dietician

## 2018-07-07 ENCOUNTER — Other Ambulatory Visit: Payer: Self-pay

## 2018-07-07 VITALS — Ht 60.0 in | Wt 157.0 lb

## 2018-07-07 DIAGNOSIS — Z6830 Body mass index (BMI) 30.0-30.9, adult: Secondary | ICD-10-CM | POA: Diagnosis not present

## 2018-07-07 DIAGNOSIS — R7303 Prediabetes: Secondary | ICD-10-CM | POA: Insufficient documentation

## 2018-07-07 DIAGNOSIS — E785 Hyperlipidemia, unspecified: Secondary | ICD-10-CM | POA: Insufficient documentation

## 2018-07-07 DIAGNOSIS — Z713 Dietary counseling and surveillance: Secondary | ICD-10-CM | POA: Diagnosis not present

## 2018-07-07 NOTE — Patient Instructions (Signed)
Include 3 meals daily. Try to include a small breakfast with at least 1 serving of carbohydrate and 1-2 oz protein. Balance lunch and dinner with 3-4 oz protein, 2-3 servings of carbohydrate and non-starchy vegetables. Include as many non-starchy vegetables as possible. Include a snack between lunch and dinner. May be a good place to drink the protein shake. Exercise:  Walk for exercise 1-2 days per week. Use myfitnesspal to record food/beverage. Avoid sugar sweetened beverages.

## 2018-07-07 NOTE — Progress Notes (Signed)
Medical Nutrition Therapy: Visit start time: 1330 end time: 1440  Assessment:   Diagnosis: prediabetes, hyperlipidemia  Past medical history: hx of migraines  Psychosocial issues/ stress concerns:  Patient rates her stress as moderate and indicates "ok" as to how well she is dealing with her stress.  Preferred learning method:  . Auditory . Visual . Hands-on  Current weight: 157 (with shoes)  Height: 60 in Medications, supplements: see list  Progress and evaluation:  Patient in for initial medical nutrition therapy assessment. Her 04/2018 HgA1c was 5.8. Her triglycerides were 254 and total cholesterol was 205. She gives a weight goal of 120 lbs which she weighed 5 years ago. She had lost weight at that time by recording food/beverage intake on myfitness pal and exercising regularly. Presently, she skips breakfast and eats 2 meals/day. Her lunch meal is 10-12:00pm  and her dinner meal is 7-9:00pm. She acknowledges that she is often "starving" by dinner. She eats 4-6 lunch and dinner meals "out" often making high fat choices such as a [place] meal or pizza or a pasta meal.  Her beverages are coffee (1 cup), water and sweet tea, 1 beer per week. She states she has a "sweet tooth" and often eats something sweet after dinner.  Physical activity: no structured exercise  Nutrition Care Education: Prediabetes:  Instructed on a meal plan for pre-diabetes including identifying carbohydrates, protein, healthy fats and non-starchy vegetables, balancing these nutrients as well as  portion control. Use food models to show portions. Discussed how meal plan allows flexibility to avoid over restriction. Discussed need to spread food over 3 meals with 1-2 small snacks. Gave and reviewed sample menu. Discussed how meal plan can also help decrease triglycerides and instructed on guidelines for lowering triglycerides. Stressed importance of exercise for glucose control and decreasing  triglycerides.   Nutritional Diagnosis:  [place]-3.3 Overweight/obesity As related to frequent dining out, excessive hunger at dinner meal, and lack of physical activity.  As evidenced by diet and exercise history..  Intervention:  Include 3 meals daily. Try to include a small breakfast with at least 1 serving of carbohydrate and 1-2 oz protein. Balance lunch and dinner with 3-4 oz protein, 2-3 servings of carbohydrate and non-starchy vegetables. Include as many non-starchy vegetables as possible. Include a snack between lunch and dinner. May be a good place to drink the protein shake. Exercise:  Walk for exercise 1-2 days per week. Use myfitnesspal to record food/beverage. Avoid sugar sweetened beverages.  Education Materials given:  . Plate Planner . Food lists/ Planning A Balanced Meal . Sample meal pattern/ menus . Goals/ instructions Learner/ who was taught:  . Patient   Level of understanding: . Partial understanding; needs review/ practice Demonstrated degree of understanding via:   Teach back Learning barriers: . None Willingness to learn/ readiness for change: . Eager, change in progress  Monitoring and Evaluation:  Dietary intake, exercise,  and body weight      follow up: 08/21/18 at 1:15 pm

## 2018-08-18 ENCOUNTER — Encounter: Payer: Self-pay | Admitting: Dietician

## 2018-08-21 ENCOUNTER — Ambulatory Visit: Payer: 59 | Admitting: Dietician

## 2018-09-30 ENCOUNTER — Encounter: Payer: Self-pay | Admitting: Primary Care

## 2018-09-30 ENCOUNTER — Ambulatory Visit (INDEPENDENT_AMBULATORY_CARE_PROVIDER_SITE_OTHER): Payer: 59 | Admitting: Primary Care

## 2018-09-30 DIAGNOSIS — F4323 Adjustment disorder with mixed anxiety and depressed mood: Secondary | ICD-10-CM

## 2018-09-30 MED ORDER — FLUOXETINE HCL 20 MG PO TABS: 20.0000 mg | ORAL_TABLET | Freq: Every day | ORAL | 1 refills | Status: DC

## 2018-09-30 NOTE — Patient Instructions (Signed)
Reduce your Zoloft to 1/2 tablet daily for one week, then stop.  Start fluoxetine (Porzac) 20 mg once daily in one week as discussed.  Schedule a follow up visit for 5 weeks for re-evaluation.  It was a pleasure to see you today! Allie Bossier, NP-C

## 2018-09-30 NOTE — Progress Notes (Signed)
Subjective:    Patient ID: Krista Carpenter, female    DOB: 11-26-1965, 53 y.o.   MRN: [id number]  HPI  Virtual Visit via Video Note  I connected with Krista Carpenter on 09/30/18 at  2:00 PM EDT by a video enabled telemedicine application and verified that I am speaking with the correct person using two identifiers.  Location: Patient: Home Provider: Office   I discussed the limitations of evaluation and management by telemedicine and the availability of in person appointments. The patient expressed understanding and agreed to proceed.  History of Present Illness:  [name] is a 53 year old female with a history of anxiety disorder who presents today for follow up of anxiety.  She is currently managed on sertraline 100 mg, Zolpidem 5 mg. She was last evaluated in late January 2020 and endorsed doing well on current regimen.  Since her last visit she's noticed an increase amount of anxiety and depression. This began several months ago with Covid-19 and her husband's recent colon cancer diagnosis inDecember 2019. Symptoms include feeling down/sad, feels nervous, feeling irritable, difficulty sleeping and has increased to a 5 mg. GAD 7 score of of 8 and PHQ 9 score of 13 today.  She's tried an increase in her Zoloft dose to 200 mg years ago, this didn't help. She's been taking Zoloft for 5+ years. She's tried Lexapo in the past and can't remember why she came off.    Observations/Objective:  Alert and oriented. Appears well, not sickly. No distress. Speaking in complete sentences.   Assessment and Plan:  Increased symptoms, doesn't feel as though Zoloft is any longer effective. Discussed options and we decided to wean off of Zoloft and switch to Prozac.  Will have her take 50 mg of Zoloft x 1 week, then stop. Start fluoxetine 20 mg in one week. We will plan to see her back in 5 weeks for re-evaluation. She will call sooner if she has any problems or concerns.  Follow Up  Instructions:  Reduce your Zoloft to 1/2 tablet daily for one week, then stop.  Start fluoxetine (Porzac) 20 mg once daily in one week as discussed.  Schedule a follow up visit for 5 weeks for re-evaluation.  It was a pleasure to see you today! Mayra ReelKate Emeli Goguen, NP-C    I discussed the assessment and treatment plan with the patient. The patient was provided an opportunity to ask questions and all were answered. The patient agreed with the plan and demonstrated an understanding of the instructions.   The patient was advised to call back or seek an in-person evaluation if the symptoms worsen or if the condition fails to improve as anticipated.    Doreene NestKatherine K Gerianne Simonet, NP    Review of Systems  Respiratory: Negative for shortness of breath.   Cardiovascular: Negative for chest pain.  Psychiatric/Behavioral: Positive for sleep disturbance. Negative for suicidal ideas. The patient is nervous/anxious.        See HPI       Past Medical History:  Diagnosis Date  . History of abnormal cervical Pap smear   . IBS (irritable bowel syndrome)   . Migraine      Social History   Socioeconomic History  . Marital status: Married    Spouse name: Not on file  . Number of children: Not on file  . Years of education: Not on file  . Highest education level: Not on file  Occupational History  . Not on file  Social  Needs  . Financial resource strain: Not on file  . Food insecurity:    Worry: Not on file    Inability: Not on file  . Transportation needs:    Medical: Not on file    Non-medical: Not on file  Tobacco Use  . Smoking status: Never Smoker  . Smokeless tobacco: Never Used  Substance and Sexual Activity  . Alcohol use: Yes    Alcohol/week: 0.0 standard drinks    Comment: occasion  . Drug use: No  . Sexual activity: Yes    Partners: Male    Birth control/protection: I.U.D.  Lifestyle  . Physical activity:    Days per week: Not on file    Minutes per session: Not on file  .  Stress: Not on file  Relationships  . Social connections:    Talks on phone: Not on file    Gets together: Not on file    Attends religious service: Not on file    Active member of club or organization: Not on file    Attends meetings of clubs or organizations: Not on file    Relationship status: Not on file  . Intimate partner violence:    Fear of current or ex partner: Not on file    Emotionally abused: Not on file    Physically abused: Not on file    Forced sexual activity: Not on file  Other Topics Concern  . Not on file  Social History Narrative   Married.   2 children.   Works at [employer].   Enjoys scrapbooking, reading.    Past Surgical History:  Procedure Laterality Date  . CHOLECYSTECTOMY    . TONSILLECTOMY      Family History  Problem Relation Age of Onset  . Cancer Mother        skin  . Diabetes Father   . Diabetes Brother     No Known Allergies  Current Outpatient Medications on File Prior to Visit  Medication Sig Dispense Refill  . sertraline (ZOLOFT) 100 MG tablet Take 1 tablet (100 mg total) by mouth 2 (two) times daily. (Patient taking differently: Take 100 mg by mouth daily. ) 60 tablet 11  . zolpidem (AMBIEN) 5 MG tablet Take 5 mg by mouth at bedtime as needed.   1   No current facility-administered medications on file prior to visit.     Wt 156 lb (70.8 kg)   LMP 12/02/2012   BMI 30.47 kg/m    Objective:   Physical Exam  Constitutional: She is oriented to person, place, and time. She appears well-nourished.  Respiratory: Effort normal. No respiratory distress.  Neurological: She is alert and oriented to person, place, and time.  Psychiatric: She has a normal mood and affect.           Assessment & Plan:

## 2018-09-30 NOTE — Assessment & Plan Note (Signed)
Increased symptoms, doesn't feel as though Zoloft is any longer effective. Discussed options and we decided to wean off of Zoloft and switch to Prozac.  Will have her take 50 mg of Zoloft x 1 week, then stop. Start fluoxetine 20 mg in one week. We will plan to see her back in 5 weeks for re-evaluation. She will call sooner if she has any problems or concerns.

## 2018-10-08 ENCOUNTER — Encounter: Payer: Self-pay | Admitting: Primary Care

## 2018-10-15 ENCOUNTER — Telehealth: Payer: Self-pay

## 2018-10-15 ENCOUNTER — Ambulatory Visit
Admission: RE | Admit: 2018-10-15 | Discharge: 2018-10-15 | Disposition: A | Discharge status: Home / Self Care | Payer: 59 | Source: Ambulatory Visit | Attending: Family Medicine | Admitting: Family Medicine

## 2018-10-15 ENCOUNTER — Ambulatory Visit
Admission: EM | Admit: 2018-10-15 | Discharge: 2018-10-15 | Disposition: A | Discharge status: Home / Self Care | Payer: 59 | Attending: Family Medicine | Admitting: Family Medicine

## 2018-10-15 ENCOUNTER — Encounter: Payer: Self-pay | Admitting: Emergency Medicine

## 2018-10-15 ENCOUNTER — Other Ambulatory Visit: Payer: Self-pay

## 2018-10-15 DIAGNOSIS — R1032 Left lower quadrant pain: Secondary | ICD-10-CM

## 2018-10-15 DIAGNOSIS — Z9049 Acquired absence of other specified parts of digestive tract: Secondary | ICD-10-CM | POA: Insufficient documentation

## 2018-10-15 DIAGNOSIS — R911 Solitary pulmonary nodule: Secondary | ICD-10-CM | POA: Diagnosis not present

## 2018-10-15 DIAGNOSIS — K760 Fatty (change of) liver, not elsewhere classified: Secondary | ICD-10-CM | POA: Diagnosis present

## 2018-10-15 DIAGNOSIS — K5732 Diverticulitis of large intestine without perforation or abscess without bleeding: Secondary | ICD-10-CM | POA: Diagnosis not present

## 2018-10-15 MED ORDER — CIPROFLOXACIN HCL 500 MG PO TABS: 500.0000 mg | ORAL_TABLET | Freq: Two times a day (BID) | ORAL | 0 refills | Status: DC

## 2018-10-15 MED ORDER — METRONIDAZOLE 500 MG PO TABS: 500.0000 mg | ORAL_TABLET | Freq: Three times a day (TID) | ORAL | 0 refills | Status: DC

## 2018-10-15 NOTE — ED Triage Notes (Signed)
Pt on the schedule for CT at out patient Krista Carpenter) imaging center at 1:30

## 2018-10-15 NOTE — Telephone Encounter (Signed)
Message left for patient to return my call.  

## 2018-10-15 NOTE — ED Provider Notes (Signed)
[facility]    CSN: [id number] Arrival date & time: 10/15/18  1100  History   Chief Complaint Chief Complaint  Patient presents with   Abdominal Pain   HPI  53 year old female presents with abdominal pain.  Started on Monday.  Has continued to persist.  Constant.  Started in the left flank and now has radiated to the left lower quadrant.  She has had some nausea.  No vomiting.  No changes to her normal bowel habits.  No fever.  No known relieving factors.  She states that it is exacerbated by pressure.  No relieving factors.  No other associated symptoms.  No other complaints.  PMH, Surgical Hx, Family Hx, Social History reviewed and updated as below.  Past Medical History:  Diagnosis Date   Diverticula of colon    sigmoid and descending colon   History of abnormal cervical Pap smear    IBS (irritable bowel syndrome)    Migraine     Patient Active Problem List   Diagnosis Date Noted   Preventative health care 05/20/2018   Prediabetes 05/20/2018   Hyperlipidemia 05/20/2018   Adjustment disorder with mixed anxiety and depressed mood 05/31/2015   Overweight(278.02) 12/18/2011   Migraine 05/08/2011    Past Surgical History:  Procedure Laterality Date   CHOLECYSTECTOMY     colonoscopy     TONSILLECTOMY     WRIST ARTHROCENTESIS      OB History    Gravida  3   Para      Term      Preterm      AB  1   Living  2     SAB  1   TAB      Ectopic      Multiple      Live Births  2            Home Medications    Prior to Admission medications   Medication Sig Start Date End Date Taking? Authorizing Provider  ALPRAZolam Duanne Moron) 0.25 MG tablet Take 0.25 mg by mouth at bedtime as needed for anxiety.   Yes [provider]  FLUoxetine (PROZAC) 20 MG tablet Take 1 tablet (20 mg total) by mouth daily. Anxiety and depression. 09/30/18  Yes Pleas Koch, NP  zolpidem (AMBIEN) 5 MG tablet Take 5 mg by mouth at bedtime as  needed.  07/21/17  Yes [provider]  ciprofloxacin (CIPRO) 500 MG tablet Take 1 tablet (500 mg total) by mouth 2 (two) times daily. 10/15/18   Coral Spikes, DO  metroNIDAZOLE (FLAGYL) 500 MG tablet Take 1 tablet (500 mg total) by mouth 3 (three) times daily. 10/15/18   Coral Spikes, DO    Family History Family History  Problem Relation Age of Onset   Cancer Mother        skin   Diabetes Father    Diabetes Brother     Social History Social History   Tobacco Use   Smoking status: Never Smoker   Smokeless tobacco: Never Used  Substance Use Topics   Alcohol use: Yes    Alcohol/week: 0.0 standard drinks    Comment: occasion   Drug use: No     Allergies   Patient has no known allergies.   Review of Systems Review of Systems  Constitutional: Negative for fever.  Gastrointestinal: Positive for abdominal pain and nausea.  Genitourinary: Negative.    Physical Exam Triage Vital Signs ED Triage Vitals  Enc Vitals Group  BP 10/15/18 1113 (!) 146/97     Pulse Rate 10/15/18 1113 (!) 104     Resp 10/15/18 1113 18     Temp 10/15/18 1113 99.3 F (37.4 C)     Temp Source 10/15/18 1113 Oral     SpO2 10/15/18 1113 96 %     Weight 10/15/18 1116 149 lb (67.6 kg)     Height 10/15/18 1116 5' (1.524 m)     Head Circumference --      Peak Flow --      Pain Score 10/15/18 1112 4     Pain Loc --      Pain Edu? --      Excl. in GC? --    Updated Vital Signs BP (!) 146/97 (BP Location: Left Arm)    Pulse (!) 104    Temp 99.3 F (37.4 C) (Oral)    Resp 18    Ht 5' (1.524 m)    Wt 67.6 kg    LMP 12/02/2012    SpO2 96%    BMI 29.10 kg/m   Visual Acuity Right Eye Distance:   Left Eye Distance:   Bilateral Distance:    Right Eye Near:   Left Eye Near:    Bilateral Near:     Physical Exam Vitals signs and nursing note reviewed.  Constitutional:      Appearance: She is well-developed.     Comments: Patient appears uncomfortable but is in no acute distress.    HENT:     Head: Normocephalic and atraumatic.  Eyes:     General:        Right eye: No discharge.        Left eye: No discharge.     Conjunctiva/sclera: Conjunctivae normal.  Cardiovascular:     Rate and Rhythm: Regular rhythm. Tachycardia present.  Pulmonary:     Effort: Pulmonary effort is normal.     Breath sounds: Normal breath sounds.  Abdominal:     General: There is no distension.     Palpations: Abdomen is soft.     Comments: Exquisite tenderness to palpation in the left lower quadrant.  Neurological:     Mental Status: She is alert.  Psychiatric:        Mood and Affect: Mood normal.        Behavior: Behavior normal.    UC Treatments / Results  Labs (all labs ordered are listed, but only abnormal results are displayed) Labs Reviewed - No data to display  EKG None  Radiology Ct Abdomen Pelvis Wo Contrast  Result Date: 10/15/2018 CLINICAL DATA:  Severe left mid abdominal pain for 2 days. Frequent urination with slight nausea. History irritable bowel syndrome and diverticulosis. EXAM: CT ABDOMEN AND PELVIS WITHOUT CONTRAST TECHNIQUE: Multidetector CT imaging of the abdomen and pelvis was performed following the standard protocol without IV contrast. COMPARISON:  Ultrasound 04/09/2003.  Chest radiographs 05/29/2015. FINDINGS: Lower chest: 3 mm right lower lobe nodule on image 1/3, not visible on prior radiographs. The lung bases are otherwise clear. There is no pleural or pericardial effusion. Hepatobiliary: Moderate hepatic steatosis without focal abnormality on noncontrast imaging. There is mild sparing centrally in the liver. No significant biliary dilatation status post cholecystectomy. Pancreas: Unremarkable. No pancreatic ductal dilatation or surrounding inflammatory changes. Spleen: Normal in size without focal abnormality. Adrenals/Urinary Tract: Both adrenal glands appear normal. The kidneys and ureters appear normal. There is no hydronephrosis or urinary tract  calculus. The bladder is decompressed. Stomach/Bowel: No enteric contrast  was administered. The stomach, small bowel, appendix and proximal colon appear normal. There are diverticular changes throughout the descending and sigmoid colon. There is an inflamed diverticulum posteriorly in the mid descending colon (image 48/2) with adjacent soft tissue stranding consistent with acute diverticulitis. No bowel perforation or abscess identified. Vascular/Lymphatic: There are no enlarged abdominal or pelvic lymph nodes. No significant vascular findings on noncontrast imaging. Retroaortic left renal vein. Reproductive: The uterus and ovaries appear normal. No adnexal mass. Other: No ascites or free air.  Intact anterior abdominal wall. Musculoskeletal: No acute or significant osseous findings. IMPRESSION: 1. Findings are consistent with uncomplicated descending colonic diverticulitis. No evidence of bowel perforation or abscess. 2. Hepatic steatosis without focal abnormality or biliary dilatation post cholecystectomy. 3. 3 mm right lower lobe pulmonary nodule. This appearance is almost certainly benign, and no dedicated follow-up is required if this patient is low risk for bronchogenic carcinoma (and has no known or suspected primary neoplasm). Non-contrast chest CT can be considered in 12 months if patient is high-risk. This recommendation follows the consensus statement: Guidelines for Management of Incidental Pulmonary Nodules Detected on CT Images: From the Fleischner Society 2017; Radiology 2017; 284:228-243. 4. These results will be called to the ordering clinician or representative by the Radiologist Assistant, and communication documented in the PACS or zVision Dashboard. Electronically Signed   By: Carey BullocksWilliam  Veazey M.D.   On: 10/15/2018 13:57    Procedures Procedures (including critical care time)  Medications Ordered in UC Medications - No data to display  Initial Impression / Assessment and Plan / UC Course   I have reviewed the triage vital signs and the nursing notes.  Pertinent labs & imaging results that were available during my care of the patient were reviewed by me and considered in my medical decision making (see chart for details).    53 year old female presents with diverticulitis.  CT confirmed.  Treating with Cipro and Flagyl.  Final Clinical Impressions(s) / UC Diagnoses   Final diagnoses:  LLQ pain  Diverticulitis of colon     Discharge Instructions     CT at 130 (be there at 115) - [facility].  I will call with results.  Take care  Dr. Adriana Simasook    ED Prescriptions    Medication Sig Dispense Auth. Provider   ciprofloxacin (CIPRO) 500 MG tablet Take 1 tablet (500 mg total) by mouth 2 (two) times daily. 14 tablet Debanhi Blaker G, DO   metroNIDAZOLE (FLAGYL) 500 MG tablet Take 1 tablet (500 mg total) by mouth 3 (three) times daily. 21 tablet Tommie Samsook, Chancey Ringel G, DO     Controlled Substance Prescriptions [place] Controlled Substance Registry consulted? Not Applicable   Tommie SamsCook, Trai Ells G, DO 10/15/18 1416

## 2018-10-15 NOTE — Telephone Encounter (Signed)
Please check on patient, did she go to [facility]? How is she feeling? If no better then will need office visit as long as she is negative for Covid screen.

## 2018-10-15 NOTE — Telephone Encounter (Signed)
Since 06/22/20pt has lt side abd pain that is above and below the waistline. Pain is sharp and constant and pt thinks pain goes into back. Tender to touch which makes pain worse. No fever,N&V,constipation diarrhea,muscle pain,no loss of taste/smell and no UTI symptoms. No travel & no known exposure to + covid.pt works at [employer] but wears a mask. Pt does have H/A, breathing heavier than usual and last night had chills. Due to possible covid symptoms pt will go to [facility] in [place] for eval. FYI to Gentry Fitz NP.

## 2018-10-15 NOTE — ED Triage Notes (Signed)
Patient states she has had abdominal pain since Monday.  States area is sensitive to touch

## 2018-10-15 NOTE — Discharge Instructions (Signed)
CT at 130 (be there at 115) - [facility].  I will call with results.  Take care  Dr. Lacinda Axon

## 2018-10-17 NOTE — Telephone Encounter (Signed)
Noted  

## 2018-10-17 NOTE — Telephone Encounter (Signed)
Spoken to patient. She did go to [facility] and got a CT scan and blood work. Then Dx of having diverticulitis and taking meds as prescribed.

## 2018-11-04 ENCOUNTER — Other Ambulatory Visit: Payer: Self-pay

## 2018-11-04 ENCOUNTER — Ambulatory Visit: Payer: 59 | Admitting: Primary Care

## 2018-11-04 ENCOUNTER — Encounter: Payer: Self-pay | Admitting: Primary Care

## 2018-11-04 VITALS — BP 140/82 | HR 82 | Temp 98.2°F | Ht 59.5 in | Wt 148.8 lb

## 2018-11-04 DIAGNOSIS — F4323 Adjustment disorder with mixed anxiety and depressed mood: Secondary | ICD-10-CM | POA: Diagnosis not present

## 2018-11-04 DIAGNOSIS — K5792 Diverticulitis of intestine, part unspecified, without perforation or abscess without bleeding: Secondary | ICD-10-CM | POA: Diagnosis not present

## 2018-11-04 DIAGNOSIS — R7303 Prediabetes: Secondary | ICD-10-CM | POA: Diagnosis not present

## 2018-11-04 HISTORY — DX: Diverticulitis of intestine, part unspecified, without perforation or abscess without bleeding: K57.92

## 2018-11-04 LAB — POCT GLYCOSYLATED HEMOGLOBIN (HGB A1C): Hemoglobin A1C: 5.6 % (ref 4.0–5.6)

## 2018-11-04 NOTE — Assessment & Plan Note (Signed)
Improved with switch from Zoloft to Prozac, seems to be doing better. Denies SI/HI. Continue same. GAD 7 score and PHQ 9 scores have improved.

## 2018-11-04 NOTE — Patient Instructions (Signed)
Stop by the lab prior to leaving today. I will notify you of your results once received.   Continue fluoxetine 20 mg tablets for anxiety and depression.  It was a pleasure to see you today!

## 2018-11-04 NOTE — Progress Notes (Signed)
Subjective:    Patient ID: Krista Carpenter, female    DOB: 02/01/1966, 53 y.o.   MRN: [id number]  HPI  Krista Carpenter is a 53 year old female who presents today for follow up of anxiety and depression.  She was last evaluated in early June 2020 for symptoms of anxiety and depression. She felt that her regimen of Zoloft 50 mg wasn't effective, had been taking for years. GAD 7 score of 8 and PHQ 9 score of 13 so she was switched to fluoxetine 20 mg.   Since her last visit her anxiety has improved on fluoxetine 20 mg. Positive effects include worry and overall anxiety. GAD 7 score of 4 and PHQ 9 score of 5 today which is an improvement from last visit. Overall she's feeling better. Denies SI/HI.  Review of Systems  Respiratory: Negative for shortness of breath.   Cardiovascular: Negative for chest pain.  Gastrointestinal: Negative for abdominal pain.  Psychiatric/Behavioral:       See HPI       Past Medical History:  Diagnosis Date  . Diverticula of colon    sigmoid and descending colon  . History of abnormal cervical Pap smear   . IBS (irritable bowel syndrome)   . Migraine      Social History   Socioeconomic History  . Marital status: Married    Spouse name: Not on file  . Number of children: Not on file  . Years of education: Not on file  . Highest education level: Not on file  Occupational History  . Not on file  Social Needs  . Financial resource strain: Not on file  . Food insecurity    Worry: Not on file    Inability: Not on file  . Transportation needs    Medical: Not on file    Non-medical: Not on file  Tobacco Use  . Smoking status: Never Smoker  . Smokeless tobacco: Never Used  Substance and Sexual Activity  . Alcohol use: Yes    Alcohol/week: 0.0 standard drinks    Comment: occasion  . Drug use: No  . Sexual activity: Yes    Partners: Male    Birth control/protection: I.U.D.  Lifestyle  . Physical activity    Days per week: Not on file    Minutes  per session: Not on file  . Stress: Not on file  Relationships  . Social Herbalist on phone: Not on file    Gets together: Not on file    Attends religious service: Not on file    Active member of club or organization: Not on file    Attends meetings of clubs or organizations: Not on file    Relationship status: Not on file  . Intimate partner violence    Fear of current or ex partner: Not on file    Emotionally abused: Not on file    Physically abused: Not on file    Forced sexual activity: Not on file  Other Topics Concern  . Not on file  Social History Narrative   Married.   2 children.   Works at [employer].   Enjoys scrapbooking, reading.    Past Surgical History:  Procedure Laterality Date  . CHOLECYSTECTOMY    . colonoscopy    . TONSILLECTOMY    . WRIST ARTHROCENTESIS      Family History  Problem Relation Age of Onset  . Cancer Mother        skin  .  Diabetes Father   . Diabetes Brother     No Known Allergies  Current Outpatient Medications on File Prior to Visit  Medication Sig Dispense Refill  . ALPRAZolam (XANAX) 0.25 MG tablet Take 0.25 mg by mouth at bedtime as needed for anxiety.    . ciprofloxacin (CIPRO) 500 MG tablet Take 1 tablet (500 mg total) by mouth 2 (two) times daily. 14 tablet 0  . FLUoxetine (PROZAC) 20 MG tablet Take 1 tablet (20 mg total) by mouth daily. Anxiety and depression. 30 tablet 1  . zolpidem (AMBIEN) 5 MG tablet Take 5 mg by mouth at bedtime as needed.   1   No current facility-administered medications on file prior to visit.     BP 140/82   Pulse 82   Temp 98.2 F (36.8 C) (Temporal)   Ht 4' 11.5" (1.511 m)   Wt 148 lb 12 oz (67.5 kg)   LMP 12/02/2012   SpO2 97%   BMI 29.54 kg/m    Objective:   Physical Exam  Constitutional: She appears well-nourished.  Neck: Neck supple.  Cardiovascular: Normal rate and regular rhythm.  Respiratory: Effort normal and breath sounds normal.  Skin: Skin is warm and  dry.  Psychiatric: She has a normal mood and affect.           Assessment & Plan:

## 2018-11-04 NOTE — Assessment & Plan Note (Signed)
Diagnosed and treated in the ED, resolved with treatment. Continue to monitor.

## 2018-11-18 ENCOUNTER — Other Ambulatory Visit: Payer: 59

## 2018-11-30 DIAGNOSIS — F4323 Adjustment disorder with mixed anxiety and depressed mood: Secondary | ICD-10-CM

## 2018-12-01 DIAGNOSIS — M65312 Trigger thumb, left thumb: Secondary | ICD-10-CM | POA: Insufficient documentation

## 2018-12-02 MED ORDER — FLUOXETINE HCL 40 MG PO CAPS: 40.0000 mg | ORAL_CAPSULE | Freq: Every day | ORAL | 0 refills | Status: DC

## 2018-12-24 ENCOUNTER — Ambulatory Visit (INDEPENDENT_AMBULATORY_CARE_PROVIDER_SITE_OTHER): Payer: 59 | Admitting: Podiatry

## 2018-12-24 ENCOUNTER — Encounter: Payer: Self-pay | Admitting: Podiatry

## 2018-12-24 ENCOUNTER — Other Ambulatory Visit: Payer: Self-pay

## 2018-12-24 DIAGNOSIS — M722 Plantar fascial fibromatosis: Secondary | ICD-10-CM

## 2018-12-24 MED ORDER — CELECOXIB 100 MG PO CAPS: 100.0000 mg | ORAL_CAPSULE | Freq: Two times a day (BID) | ORAL | 1 refills | Status: DC

## 2018-12-24 MED ORDER — METHYLPREDNISOLONE 4 MG PO TBPK: ORAL_TABLET | ORAL | 0 refills | Status: DC

## 2018-12-24 NOTE — Progress Notes (Signed)
She presents today chief complaint of plantar heel left.  States that the right foot is been good in the left one is starting to bother me now.  Objective: Vital signs are stable alert and oriented x3 has pain palpation medial [place] tubercle of the left heel.  Pulses are palpable.  Neurologic system is intact.  Degenerative flexors are intact muscle strength is normal symmetrical.  No pain on palpation of the medial calcaneal tubercle of the right heel.  Continues to wear her orthotics in her shoes.  Assessment: Plantar fasciitis resolved right.  Painful left.  T.  Plan: Discussed etiology pathology and surgical therapies at this point time I injected left heel today with 20 mg Kenalog 500 mg Marcaine point of maximal tenderness.  She will go ahead and start on her methylprednisolone and get back to her Celebrex.

## 2019-01-28 ENCOUNTER — Ambulatory Visit: Payer: 59 | Admitting: Podiatry

## 2019-03-02 ENCOUNTER — Other Ambulatory Visit: Payer: Self-pay

## 2019-03-02 ENCOUNTER — Ambulatory Visit: Payer: 59 | Admitting: Podiatry

## 2019-03-02 DIAGNOSIS — M722 Plantar fascial fibromatosis: Secondary | ICD-10-CM

## 2019-03-02 MED ORDER — CELECOXIB 200 MG PO CAPS: 200.0000 mg | ORAL_CAPSULE | Freq: Two times a day (BID) | ORAL | 3 refills | Status: DC

## 2019-03-02 NOTE — Progress Notes (Signed)
She presents today still for her left foot states that it started hurting again about a month ago from her right was doing good.  The last injection helped me for about a week.  Objective: Vital signs are stable alert and oriented x3 pulses are palpable.  She still has pain on palpation medial calcaneal tubercles bilaterally.  Assessment: Plantar fasciitis bilateral.  Plan: Injected the heels bilaterally.  Put her back in Celebrex 200 mg twice a day for short time.  I had like to follow-up with her in about a month.

## 2019-03-30 ENCOUNTER — Ambulatory Visit: Payer: 59 | Admitting: Podiatry

## 2019-05-06 ENCOUNTER — Ambulatory Visit: Payer: 59 | Admitting: Primary Care

## 2019-05-07 LAB — HM PAP SMEAR

## 2019-05-07 LAB — RESULTS CONSOLE HPV: CHL HPV: NEGATIVE

## 2019-05-08 ENCOUNTER — Ambulatory Visit: Payer: 59 | Admitting: Primary Care

## 2019-05-08 ENCOUNTER — Encounter: Payer: Self-pay | Admitting: Primary Care

## 2019-05-08 ENCOUNTER — Other Ambulatory Visit: Payer: Self-pay

## 2019-05-08 VITALS — BP 154/96 | HR 100 | Temp 97.3°F | Resp 16 | Ht 60.5 in | Wt 154.4 lb

## 2019-05-08 DIAGNOSIS — I10 Essential (primary) hypertension: Secondary | ICD-10-CM | POA: Diagnosis not present

## 2019-05-08 MED ORDER — LOSARTAN POTASSIUM 50 MG PO TABS: 50.0000 mg | ORAL_TABLET | Freq: Every day | ORAL | 0 refills | Status: DC

## 2019-05-08 NOTE — Assessment & Plan Note (Signed)
Several documented above readings, also with two readings yesterday at GYN office.  Suspect symptoms are secondary to uncontrolled hypertension, will need to initiate treatment.  Rx for losartan 50 mg sent to pharmacy. Will have her track her BP readings at home, we will plan to see her back in 2 weeks for BP check and labs.  Work note provided to remain out today given symptoms and new treatment. She will need to start losartan ASAP.

## 2019-05-08 NOTE — Patient Instructions (Signed)
Start losartan 50 mg once daily for blood pressure.   Start monitoring your blood pressure daily, around the same time of day, for the next 2-3 weeks.  Ensure that you have rested for 30 minutes prior to checking your blood pressure. Record your readings and bring them to your next visit.  Schedule a follow up visit for 2 weeks for blood pressure check.  It was a pleasure to see you today!

## 2019-05-08 NOTE — Progress Notes (Signed)
Subjective:    Patient ID: Krista Carpenter, female    DOB: [birth year], 54 y.o.   MRN: [id number]  HPI  This visit occurred during the SARS-CoV-2 public health emergency.  Safety protocols were in place, including screening questions prior to the visit, additional usage of staff PPE, and extensive cleaning of exam room while observing appropriate contact time as indicated for disinfecting solutions.   Krista Carpenter is a 54 year old female who presents today with a chief complaint of headaches and high blood pressure.  Headaches began on December 31st which are located to the mid frontal lobe and behind right eye. She was at work a few days later and noticed some dizziness and blurred vision which has occurred intermittently since. She's tried taking Ibuprofen and Tylenol without improvement.  She initially thought symptoms were secondary to her glasses/contact prescription as it was just renewed in December 2020, but she's reverted back to her older prescription without improvement.   She visited her GYN yesterday who checked her blood pressure twice, first reading of 161/98 and second reading of 148/104. She has never been treated for hypertension in the past. Denies family history.   She does have symptoms today. She was called this morning by her employer, asked to go into work today at noon, due to symptoms she's not feeling up to going. She is needing a work excuse.   BP Readings from Last 3 Encounters:  05/08/19 (!) 154/96  11/04/18 140/82  10/15/18 (!) 146/97     Review of Systems  Constitutional: Positive for fatigue.  Eyes: Positive for visual disturbance.  Respiratory: Negative for shortness of breath.   Cardiovascular: Negative for chest pain.  Neurological: Positive for dizziness and headaches.       Past Medical History:  Diagnosis Date  . Acute diverticulitis   . Diverticula of colon    sigmoid and descending colon  . History of abnormal cervical Pap smear   .  IBS (irritable bowel syndrome)   . Migraine      Social History   Socioeconomic History  . Marital status: Married    Spouse name: Not on file  . Number of children: Not on file  . Years of education: Not on file  . Highest education level: Not on file  Occupational History  . Not on file  Tobacco Use  . Smoking status: Never Smoker  . Smokeless tobacco: Never Used  Substance and Sexual Activity  . Alcohol use: Yes    Alcohol/week: 0.0 standard drinks    Comment: occasion  . Drug use: No  . Sexual activity: Yes    Partners: Male    Birth control/protection: I.U.D.  Other Topics Concern  . Not on file  Social History Narrative   Married.   2 children.   Works at [employer].   Enjoys scrapbooking, reading.   Social Determinants of Health   Financial Resource Strain:   . Difficulty of Paying Living Expenses: Not on file  Food Insecurity:   . Worried About Programme researcher, broadcasting/film/video in the Last Year: Not on file  . Ran Out of Food in the Last Year: Not on file  Transportation Needs:   . Lack of Transportation (Medical): Not on file  . Lack of Transportation (Non-Medical): Not on file  Physical Activity:   . Days of Exercise per Week: Not on file  . Minutes of Exercise per Session: Not on file  Stress:   . Feeling of Stress :  Not on file  Social Connections:   . Frequency of Communication with Friends and Family: Not on file  . Frequency of Social Gatherings with Friends and Family: Not on file  . Attends Religious Services: Not on file  . Active Member of Clubs or Organizations: Not on file  . Attends Archivist Meetings: Not on file  . Marital Status: Not on file  Intimate Partner Violence:   . Fear of Current or Ex-Partner: Not on file  . Emotionally Abused: Not on file  . Physically Abused: Not on file  . Sexually Abused: Not on file    Past Surgical History:  Procedure Laterality Date  . CHOLECYSTECTOMY    . colonoscopy    . TONSILLECTOMY    .  WRIST ARTHROCENTESIS      Family History  Problem Relation Age of Onset  . Cancer Mother        skin  . Diabetes Father   . Diabetes Brother     No Known Allergies  Current Outpatient Medications on File Prior to Visit  Medication Sig Dispense Refill  . celecoxib (CELEBREX) 200 MG capsule Take 1 capsule (200 mg total) by mouth 2 (two) times daily. 60 capsule 3  . FLUoxetine (PROZAC) 40 MG capsule Take 1 capsule (40 mg total) by mouth daily. For anxiety and depression. 90 capsule 0  . zolpidem (AMBIEN) 5 MG tablet Take 5 mg by mouth at bedtime as needed.   1  . ALPRAZolam (XANAX) 0.25 MG tablet Take 0.25 mg by mouth at bedtime as needed for anxiety.     No current facility-administered medications on file prior to visit.    BP (!) 154/96   Pulse 100   Temp (!) 97.3 F (36.3 C) (Temporal)   Resp 16   Ht 5' 0.5" (1.537 m)   Wt 154 lb 6.4 oz (70 kg)   LMP 12/02/2012   SpO2 97%   BMI 29.66 kg/m    Objective:   Physical Exam  Constitutional: She appears well-nourished.  Cardiovascular: Normal rate and regular rhythm.  Respiratory: Effort normal and breath sounds normal.  Musculoskeletal:     Cervical back: Neck supple.  Skin: Skin is warm and dry.  Psychiatric: She has a normal mood and affect.           Assessment & Plan:

## 2019-05-12 LAB — HM MAMMOGRAPHY

## 2019-05-22 ENCOUNTER — Encounter: Payer: Self-pay | Admitting: Primary Care

## 2019-05-22 ENCOUNTER — Ambulatory Visit: Payer: 59 | Admitting: Primary Care

## 2019-05-22 ENCOUNTER — Other Ambulatory Visit: Payer: Self-pay

## 2019-05-22 VITALS — BP 134/94 | HR 90 | Temp 96.4°F | Ht 59.5 in | Wt 153.5 lb

## 2019-05-22 DIAGNOSIS — G43901 Migraine, unspecified, not intractable, with status migrainosus: Secondary | ICD-10-CM | POA: Diagnosis not present

## 2019-05-22 DIAGNOSIS — E785 Hyperlipidemia, unspecified: Secondary | ICD-10-CM

## 2019-05-22 DIAGNOSIS — I10 Essential (primary) hypertension: Secondary | ICD-10-CM

## 2019-05-22 DIAGNOSIS — R7303 Prediabetes: Secondary | ICD-10-CM

## 2019-05-22 LAB — BASIC METABOLIC PANEL
BUN: 17 mg/dL (ref 6–23)
CO2: 28 mEq/L (ref 19–32)
Calcium: 9.5 mg/dL (ref 8.4–10.5)
Chloride: 103 mEq/L (ref 96–112)
Creatinine, Ser: 0.85 mg/dL (ref 0.40–1.20)
GFR: 70.16 mL/min (ref >60.00)
Glucose, Bld: 94 mg/dL (ref 70–99)
Potassium: 4.6 mEq/L (ref 3.5–5.1)
Sodium: 137 mEq/L (ref 135–145)

## 2019-05-22 LAB — HEMOGLOBIN A1C: Hgb A1c MFr Bld: 5.7 % (ref 4.6–6.5)

## 2019-05-22 LAB — TSH: TSH: 1.04 u[IU]/mL (ref 0.35–4.50)

## 2019-05-22 LAB — LIPID PANEL
Cholesterol: 246 mg/dL — ABNORMAL HIGH (ref 0–200)
HDL: 46.3 mg/dL (ref >39.00)
NonHDL: 199.96
Total CHOL/HDL Ratio: 5
Triglycerides: 223 mg/dL — ABNORMAL HIGH (ref 0.0–149.0)
VLDL: 44.6 mg/dL — ABNORMAL HIGH (ref 0.0–40.0)

## 2019-05-22 LAB — LDL CHOLESTEROL, DIRECT: Direct LDL: 164 mg/dL

## 2019-05-22 MED ORDER — LOSARTAN POTASSIUM 100 MG PO TABS: 100.0000 mg | ORAL_TABLET | Freq: Every day | ORAL | 3 refills | Status: DC

## 2019-05-22 MED ORDER — KETOROLAC TROMETHAMINE 60 MG/2ML IM SOLN
60.0000 mg | Freq: Once | INTRAMUSCULAR | Status: AC
  Administered 2019-05-22: 11:00:00 60 mg via INTRAMUSCULAR

## 2019-05-22 NOTE — Assessment & Plan Note (Signed)
Improved but not at goal. Increase losartan to 100 mg. BMP pending. Follow up in 2-3 weeks.

## 2019-05-22 NOTE — Progress Notes (Signed)
Subjective:    Patient ID: Krista Carpenter, female    DOB: 01-13-1966, 54 y.o.   MRN: [id number]  HPI  This visit occurred during the SARS-CoV-2 public health emergency.  Safety protocols were in place, including screening questions prior to the visit, additional usage of staff PPE, and extensive cleaning of exam room while observing appropriate contact time as indicated for disinfecting solutions.   Krista Carpenter is a 54 year old female with a history of migraines, hypertension, anxiety and depression, prediabetes, hyperlipidemia who presents today for follow up of hypertension.  She was last evaluated two weeks ago for symptoms of frontal lobe headaches that had been consistent since late December 2020. BP was noted to be elevated on numerous office visits, most recently with GYN. Given elevated readings coupled with symptoms we treated with losartan 50 mg.  Since her last visit she's checking her BP at home which is running 130's-150's/80's-90's. She continues to feel her bilateral headache (behind both eyes and frontal lobes) and blurred vision. The headache has overall improved and is not daily. She's taking OTC treatment without improvement in headaches. She had a full eye exam in December 2020. She has chronic mild photophobia, history of migraines, this headache feels different.  BP Readings from Last 3 Encounters:  05/22/19 (!) 134/94  05/08/19 (!) 154/96  11/04/18 140/82      Review of Systems  Eyes: Positive for visual disturbance.  Respiratory: Negative for shortness of breath.   Cardiovascular: Negative for chest pain.  Neurological: Positive for headaches.       Past Medical History:  Diagnosis Date  . Acute diverticulitis   . Diverticula of colon    sigmoid and descending colon  . Diverticulitis 11/04/2018  . History of abnormal cervical Pap smear   . IBS (irritable bowel syndrome)   . Migraine      Social History   Socioeconomic History  . Marital status:  Married    Spouse name: Not on file  . Number of children: Not on file  . Years of education: Not on file  . Highest education level: Not on file  Occupational History  . Not on file  Tobacco Use  . Smoking status: Never Smoker  . Smokeless tobacco: Never Used  Substance and Sexual Activity  . Alcohol use: Yes    Alcohol/week: 0.0 standard drinks    Comment: occasion  . Drug use: No  . Sexual activity: Yes    Partners: Male    Birth control/protection: I.U.D.  Other Topics Concern  . Not on file  Social History Narrative   Married.   2 children.   Works at [employer].   Enjoys scrapbooking, reading.   Social Determinants of Health   Financial Resource Strain:   . Difficulty of Paying Living Expenses: Not on file  Food Insecurity:   . Worried About Charity fundraiser in the Last Year: Not on file  . Ran Out of Food in the Last Year: Not on file  Transportation Needs:   . Lack of Transportation (Medical): Not on file  . Lack of Transportation (Non-Medical): Not on file  Physical Activity:   . Days of Exercise per Week: Not on file  . Minutes of Exercise per Session: Not on file  Stress:   . Feeling of Stress : Not on file  Social Connections:   . Frequency of Communication with Friends and Family: Not on file  . Frequency of Social Gatherings with Friends and  Family: Not on file  . Attends Religious Services: Not on file  . Active Member of Clubs or Organizations: Not on file  . Attends Banker Meetings: Not on file  . Marital Status: Not on file  Intimate Partner Violence:   . Fear of Current or Ex-Partner: Not on file  . Emotionally Abused: Not on file  . Physically Abused: Not on file  . Sexually Abused: Not on file    Past Surgical History:  Procedure Laterality Date  . CHOLECYSTECTOMY    . colonoscopy    . TONSILLECTOMY    . WRIST ARTHROCENTESIS      Family History  Problem Relation Age of Onset  . Cancer Mother        skin  .  Diabetes Father   . Diabetes Brother     No Known Allergies  Current Outpatient Medications on File Prior to Visit  Medication Sig Dispense Refill  . ALPRAZolam (XANAX) 0.25 MG tablet Take 0.25 mg by mouth at bedtime as needed for anxiety.    . celecoxib (CELEBREX) 200 MG capsule Take 1 capsule (200 mg total) by mouth 2 (two) times daily. 60 capsule 3  . FLUoxetine (PROZAC) 40 MG capsule Take 1 capsule (40 mg total) by mouth daily. For anxiety and depression. 90 capsule 0  . zolpidem (AMBIEN) 5 MG tablet Take 5 mg by mouth at bedtime as needed.   1   No current facility-administered medications on file prior to visit.    BP (!) 134/94   Pulse 90   Temp (!) 96.4 F (35.8 C) (Temporal)   Ht 4' 11.5" (1.511 m)   Wt 153 lb 8 oz (69.6 kg)   LMP 12/02/2012   SpO2 97%   BMI 30.48 kg/m    Objective:   Physical Exam  Constitutional: She appears well-nourished.  Cardiovascular: Normal rate and regular rhythm.  Respiratory: Effort normal and breath sounds normal.  Musculoskeletal:     Cervical back: Neck supple.  Skin: Skin is warm and dry.  Psychiatric: She has a normal mood and affect.           Assessment & Plan:

## 2019-05-22 NOTE — Addendum Note (Signed)
Addended by: Tawnya Crook on: 05/22/2019 11:53 AM   Modules accepted: Orders

## 2019-05-22 NOTE — Assessment & Plan Note (Signed)
Repeat Lipids pending.

## 2019-05-22 NOTE — Assessment & Plan Note (Signed)
Unclear if current headache is migraine or BP related.  IM Toradol 60 mg provided today. Working to improve BP.

## 2019-05-22 NOTE — Patient Instructions (Signed)
Stop by the lab prior to leaving today. I will notify you of your results once received.   We've increased the dose of your losartan to 100 mg.   Continue to monitor your blood pressure as discussed.  Schedule a follow up visit for 2-3 weeks for blood pressure check.  It was a pleasure to see you today!

## 2019-05-22 NOTE — Assessment & Plan Note (Signed)
Repeat A1C pending. 

## 2019-05-30 ENCOUNTER — Other Ambulatory Visit: Payer: Self-pay | Admitting: Primary Care

## 2019-05-30 DIAGNOSIS — I10 Essential (primary) hypertension: Secondary | ICD-10-CM

## 2019-06-12 ENCOUNTER — Other Ambulatory Visit: Payer: Self-pay

## 2019-06-12 ENCOUNTER — Encounter: Payer: Self-pay | Admitting: Primary Care

## 2019-06-12 ENCOUNTER — Telehealth (INDEPENDENT_AMBULATORY_CARE_PROVIDER_SITE_OTHER): Payer: 59 | Admitting: Primary Care

## 2019-06-12 VITALS — BP 132/85

## 2019-06-12 DIAGNOSIS — G4700 Insomnia, unspecified: Secondary | ICD-10-CM | POA: Diagnosis not present

## 2019-06-12 DIAGNOSIS — G43901 Migraine, unspecified, not intractable, with status migrainosus: Secondary | ICD-10-CM | POA: Diagnosis not present

## 2019-06-12 DIAGNOSIS — I10 Essential (primary) hypertension: Secondary | ICD-10-CM

## 2019-06-12 MED ORDER — TRAZODONE HCL 50 MG PO TABS: 50.0000 mg | ORAL_TABLET | Freq: Every evening | ORAL | 0 refills | Status: DC | PRN

## 2019-06-12 MED ORDER — LOSARTAN POTASSIUM 100 MG PO TABS: 100.0000 mg | ORAL_TABLET | Freq: Every day | ORAL | 3 refills | Status: DC

## 2019-06-12 NOTE — Assessment & Plan Note (Signed)
Getting Ambien per GYN for about one year. Do not recommend Ambien due to risk for dependence and side effects.  She is open to trying other options. Rx for Trazodone sent to pharmacy. She will update.

## 2019-06-12 NOTE — Progress Notes (Signed)
Subjective:    Patient ID: Krista Carpenter, female    DOB: [birth year], 54 y.o.   MRN: [id number]  HPI  Virtual Visit via Video Note  I connected with Krista Carpenter on 06/12/19 at 11:40 AM EST by a video enabled telemedicine application and verified that I am speaking with the correct person using two identifiers.  Location: Patient: Home Provider: Office   I discussed the limitations of evaluation and management by telemedicine and the availability of in person appointments. The patient expressed understanding and agreed to proceed.  History of Present Illness:  Krista Carpenter is a 54 year old female with a history of hypertension, migraines, anxiety and depression, prediabetes, hyperlipidemia who presents today for follow up of hypertension.   She was last evaluated in late January 2021 for follow up of hypertension and migraines/headaches. Blood pressure had improved on losartan 50 mg but was still above goal, losartan was increased to 100 mg. Headaches last visit had improved but were still present.   Since her last visit she's checking her blood pressure at home which is ranging 130's/80's-90's, mostly 130's/80's. Headaches continue to improve and are nearly resolved. She denies dizziness, blurred vision, chest pain.  She is also requesting a refill of her Ambien 5 mg. Typically gets refills from her GYN but forgot to request refills. Historically had difficulty falling and staying asleep, has been on Ambien for 1 year. If she misses a dose then she'll be wide awake. She's tried Melatonin and OTC medications in the past without improvement. She's never tried other OTC medications.    Observations/Objective:  Alert and oriented. Appears well, not sickly. No distress. Speaking in complete sentences.   Assessment and Plan:  See HPI.  Follow Up Instructions:  Try Trazodone 50 mg tablets for sleep. Take about 30 minutes prior to bedtime. Hold off on Ambien for now.   Continue Losartan 100 mg for blood pressure. Continue to monitor your blood pressure on occasion and notify me if you see readings at or above 135/90 on a consistent basis.  It was a pleasure to see you today! Allie Bossier, NP-C    I discussed the assessment and treatment plan with the patient. The patient was provided an opportunity to ask questions and all were answered. The patient agreed with the plan and demonstrated an understanding of the instructions.   The patient was advised to call back or seek an in-person evaluation if the symptoms worsen or if the condition fails to improve as anticipated.    Pleas Koch, NP    Review of Systems  Eyes: Negative for visual disturbance.  Cardiovascular: Negative for chest pain.  Neurological: Negative for dizziness.       Headaches have improved  Psychiatric/Behavioral: Positive for sleep disturbance.       See HPI       Past Medical History:  Diagnosis Date  . Acute diverticulitis   . Diverticula of colon    sigmoid and descending colon  . Diverticulitis 11/04/2018  . History of abnormal cervical Pap smear   . IBS (irritable bowel syndrome)   . Migraine      Social History   Socioeconomic History  . Marital status: Married    Spouse name: Not on file  . Number of children: Not on file  . Years of education: Not on file  . Highest education level: Not on file  Occupational History  . Not on file  Tobacco Use  . Smoking status:  Never Smoker  . Smokeless tobacco: Never Used  Substance and Sexual Activity  . Alcohol use: Yes    Alcohol/week: 0.0 standard drinks    Comment: occasion  . Drug use: No  . Sexual activity: Yes    Partners: Male    Birth control/protection: I.U.D.  Other Topics Concern  . Not on file  Social History Narrative   Married.   2 children.   Works at [employer].   Enjoys scrapbooking, reading.   Social Determinants of Health   Financial Resource Strain:   . Difficulty of Paying  Living Expenses: Not on file  Food Insecurity:   . Worried About Programme researcher, broadcasting/film/video in the Last Year: Not on file  . Ran Out of Food in the Last Year: Not on file  Transportation Needs:   . Lack of Transportation (Medical): Not on file  . Lack of Transportation (Non-Medical): Not on file  Physical Activity:   . Days of Exercise per Week: Not on file  . Minutes of Exercise per Session: Not on file  Stress:   . Feeling of Stress : Not on file  Social Connections:   . Frequency of Communication with Friends and Family: Not on file  . Frequency of Social Gatherings with Friends and Family: Not on file  . Attends Religious Services: Not on file  . Active Member of Clubs or Organizations: Not on file  . Attends Banker Meetings: Not on file  . Marital Status: Not on file  Intimate Partner Violence:   . Fear of Current or Ex-Partner: Not on file  . Emotionally Abused: Not on file  . Physically Abused: Not on file  . Sexually Abused: Not on file    Past Surgical History:  Procedure Laterality Date  . CHOLECYSTECTOMY    . colonoscopy    . TONSILLECTOMY    . WRIST ARTHROCENTESIS      Family History  Problem Relation Age of Onset  . Cancer Mother        skin  . Diabetes Father   . Diabetes Brother     No Known Allergies  Current Outpatient Medications on File Prior to Visit  Medication Sig Dispense Refill  . ALPRAZolam (XANAX) 0.25 MG tablet Take 0.25 mg by mouth at bedtime as needed for anxiety.    . celecoxib (CELEBREX) 200 MG capsule Take 1 capsule (200 mg total) by mouth 2 (two) times daily. 60 capsule 3  . FLUoxetine (PROZAC) 40 MG capsule Take 1 capsule (40 mg total) by mouth daily. For anxiety and depression. 90 capsule 0  . zolpidem (AMBIEN) 5 MG tablet Take 5 mg by mouth at bedtime as needed.   1   No current facility-administered medications on file prior to visit.    BP 132/85   LMP 12/02/2012    Objective:   Physical Exam  Constitutional: She  is oriented to person, place, and time. She appears well-nourished.  Respiratory: Effort normal.  Neurological: She is alert and oriented to person, place, and time.  Psychiatric: She has a normal mood and affect.           Assessment & Plan:

## 2019-06-12 NOTE — Patient Instructions (Signed)
Try Trazodone 50 mg tablets for sleep. Take about 30 minutes prior to bedtime. Hold off on Ambien for now.  Continue Losartan 100 mg for blood pressure. Continue to monitor your blood pressure on occasion and notify me if you see readings at or above 135/90 on a consistent basis.  It was a pleasure to see you today! Mayra Reel, NP-C

## 2019-06-12 NOTE — Assessment & Plan Note (Signed)
Improved based off of home readings. Continue losartan 100 mg. She will notify if she sees readings at or above 135/90 consistently.

## 2019-06-12 NOTE — Assessment & Plan Note (Signed)
Improved with better BP control. Continue to monitor.

## 2019-06-25 NOTE — Telephone Encounter (Signed)
Krista Carpenter, please enter in these meds to her chart.

## 2019-06-26 NOTE — Telephone Encounter (Signed)
Updated patient's current medications list.

## 2019-07-03 ENCOUNTER — Other Ambulatory Visit: Payer: Self-pay | Admitting: Podiatry

## 2019-07-03 ENCOUNTER — Other Ambulatory Visit: Payer: Self-pay | Admitting: Primary Care

## 2019-07-03 DIAGNOSIS — F4323 Adjustment disorder with mixed anxiety and depressed mood: Secondary | ICD-10-CM

## 2019-07-05 ENCOUNTER — Other Ambulatory Visit: Payer: Self-pay | Admitting: Primary Care

## 2019-07-05 DIAGNOSIS — G4700 Insomnia, unspecified: Secondary | ICD-10-CM

## 2019-07-06 DIAGNOSIS — G4700 Insomnia, unspecified: Secondary | ICD-10-CM

## 2019-07-07 MED ORDER — TRAZODONE HCL 50 MG PO TABS: 75.0000 mg | ORAL_TABLET | Freq: Every evening | ORAL | 1 refills | Status: DC | PRN

## 2019-07-07 NOTE — Telephone Encounter (Signed)
There is a refill request that I have forward to you

## 2019-08-28 IMAGING — CT CT ABDOMEN AND PELVIS WITHOUT CONTRAST
2 of 4 series · 15 of 46 positions shown, 17 images · non-contrast
Comparison: Ultrasound 04/09/2003.  Chest radiographs 05/29/2015.

CLINICAL DATA: Severe left mid abdominal pain for 2 days. Frequent
urination with slight nausea. History irritable bowel syndrome and
diverticulosis.

EXAM:
CT ABDOMEN AND PELVIS WITHOUT CONTRAST
TECHNIQUE: Multidetector CT imaging of the abdomen and pelvis was performed
following the standard protocol without IV contrast.

[Series 2: routine abd pelvis without · axial · non-contrast · 0.65mm/px · z∈[-1475,-1050]mm · 12 of 95 slices shown, 14 images]
[im 5/95  soft-tissue]
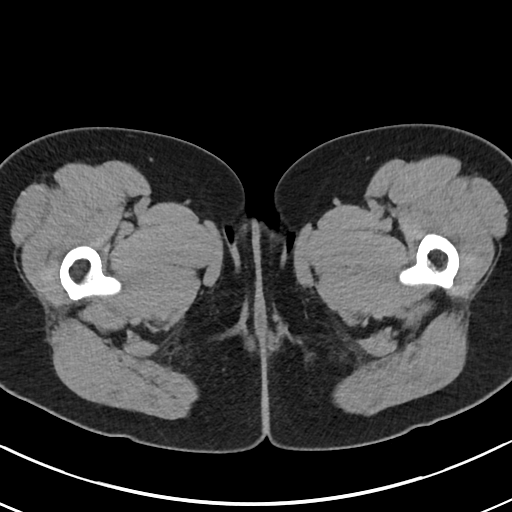
[im 5/95  bone]
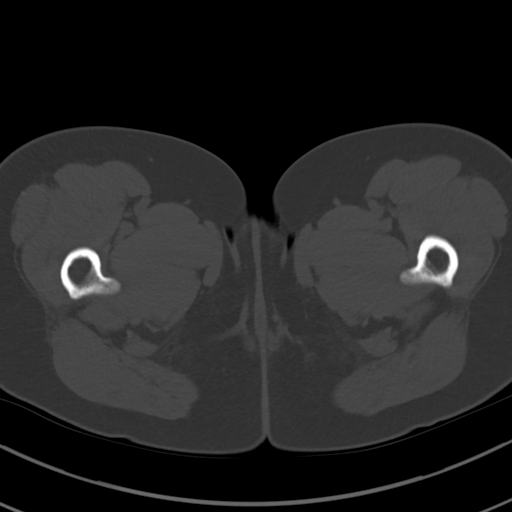
[im 13/95  soft-tissue]
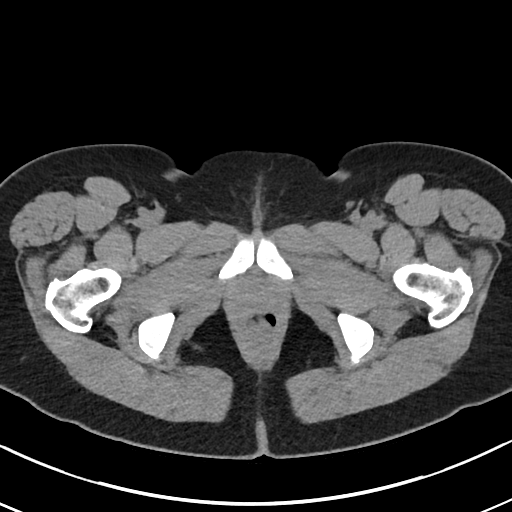
[im 21/95  soft-tissue]
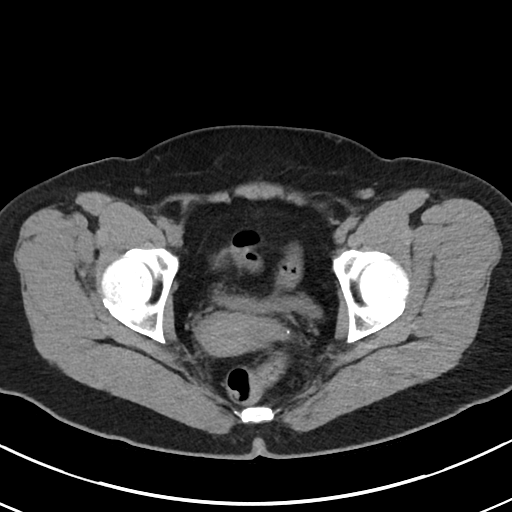
[im 29/95  soft-tissue]
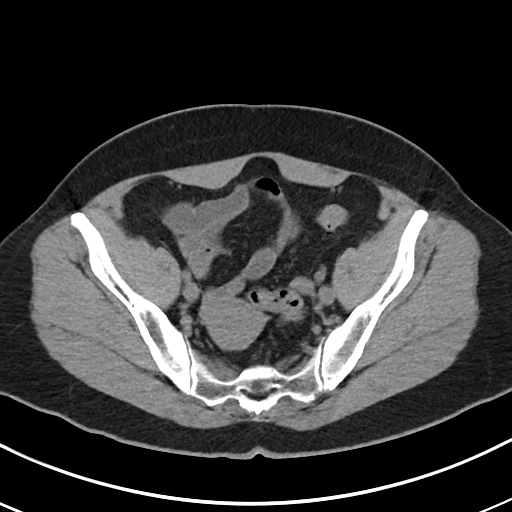
[im 37/95  soft-tissue]
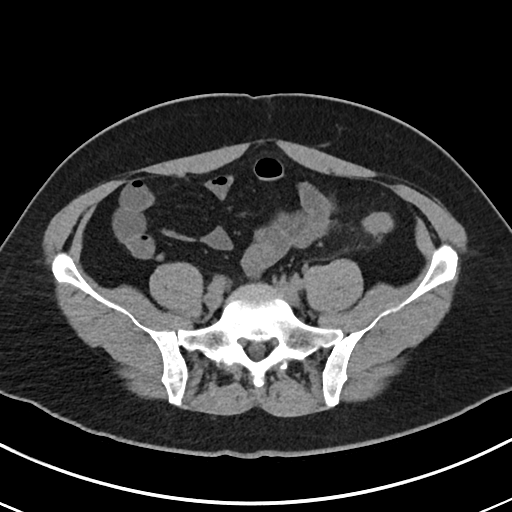
[im 45/95  soft-tissue]
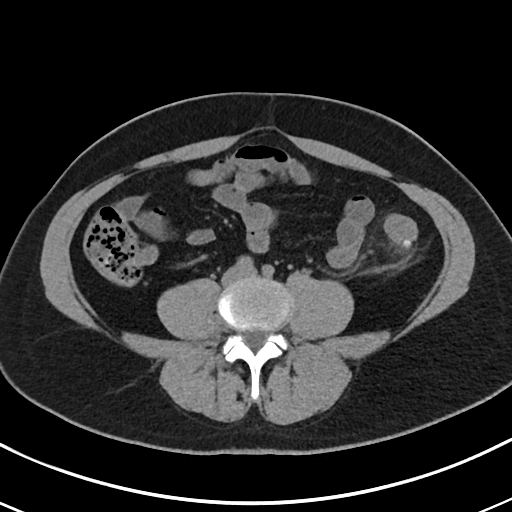
[im 50/95  soft-tissue]
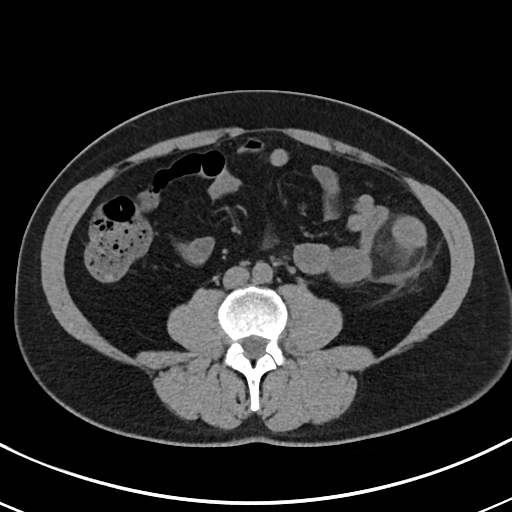
[im 58/95  soft-tissue]
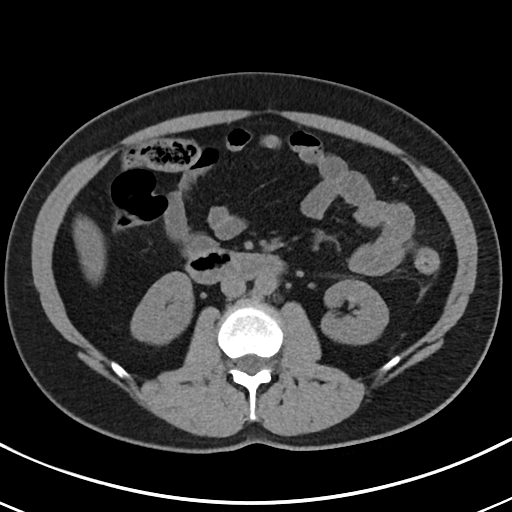
[im 66/95  soft-tissue]
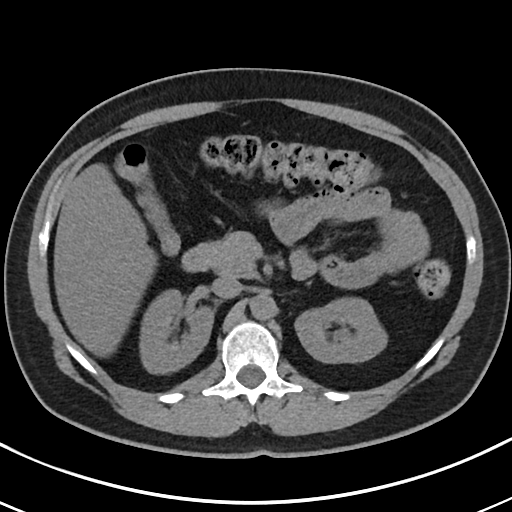
[im 66/95  bone]
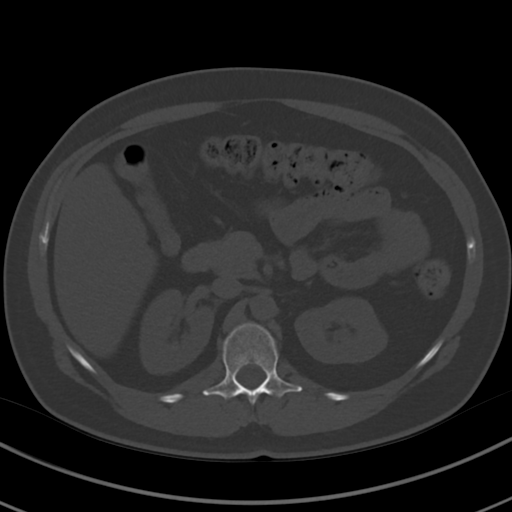
[im 74/95  soft-tissue]
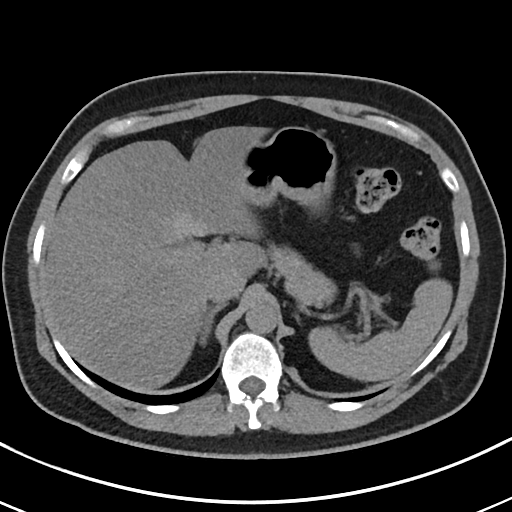
[im 82/95  soft-tissue]
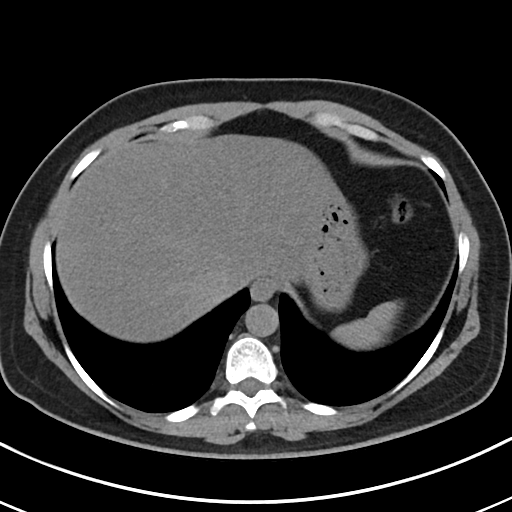
[im 90/95  soft-tissue]
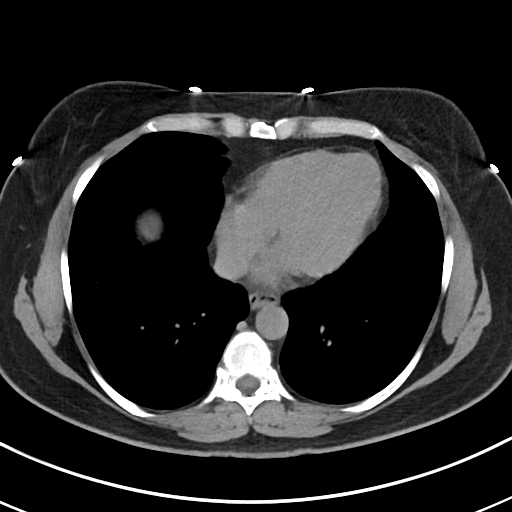

[Series 4: coronal routine abd pelvis without · coronal · non-contrast · 0.65mm/px · 3 of 130 slices shown]
[im 44/130  soft-tissue]
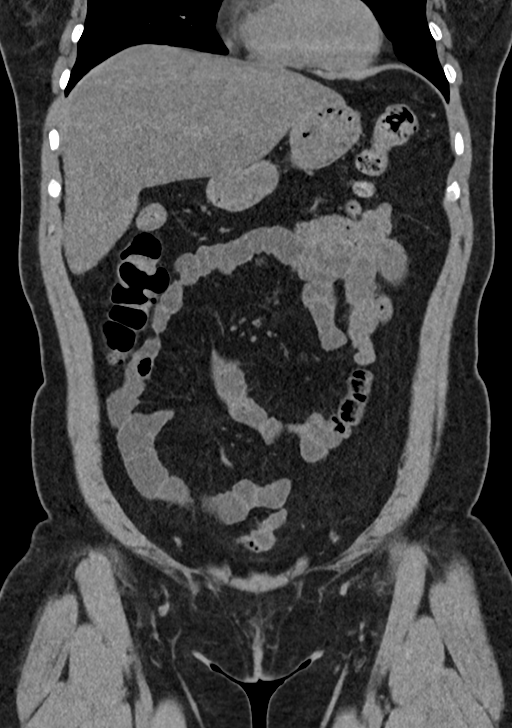
[im 58/130  soft-tissue]
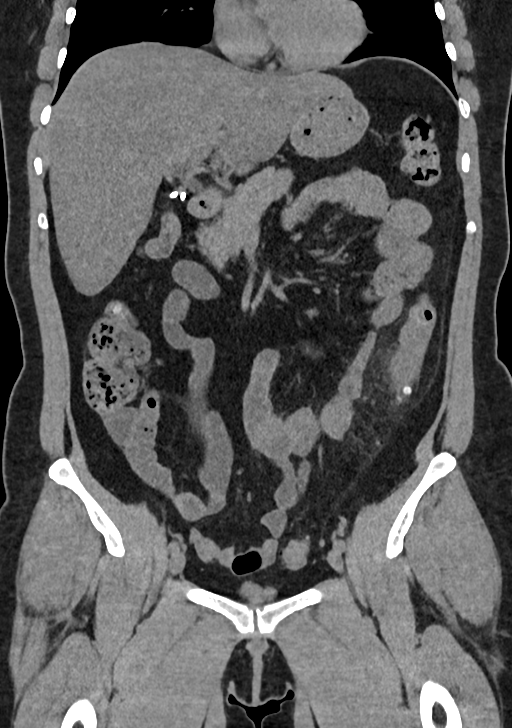
[im 72/130  soft-tissue]
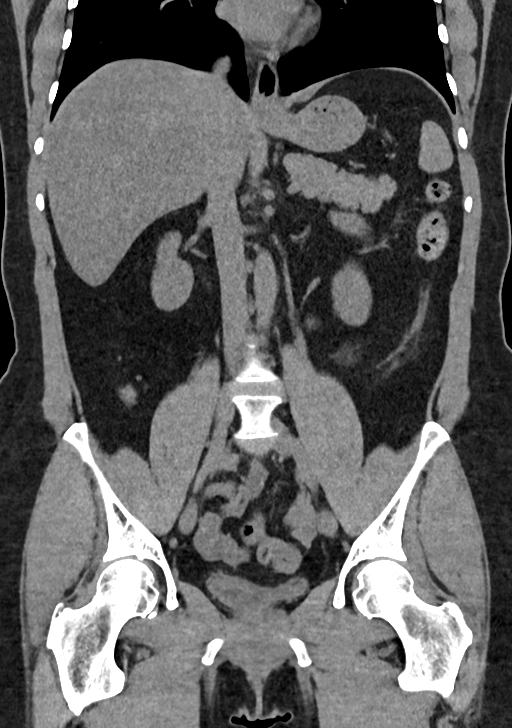

[15 of 46 positions shown; findings below may reference images not displayed]

FINDINGS: Lower chest: 3 mm right lower lobe nodule on image [DATE], not visible
on prior radiographs. The lung bases are otherwise clear. There is
no pleural or pericardial effusion.

Hepatobiliary: Moderate hepatic steatosis without focal abnormality
on noncontrast imaging. There is mild sparing centrally in the
liver. No significant biliary dilatation status post
cholecystectomy.

Pancreas: Unremarkable. No pancreatic ductal dilatation or
surrounding inflammatory changes.

Spleen: Normal in size without focal abnormality.

Adrenals/Urinary Tract: Both adrenal glands appear normal. The
kidneys and ureters appear normal. There is no hydronephrosis or
urinary tract calculus. The bladder is decompressed.

Stomach/Bowel: No enteric contrast was administered. The stomach,
small bowel, appendix and proximal colon appear normal. There are
diverticular changes throughout the descending and sigmoid colon.
There is an inflamed diverticulum posteriorly in the mid descending
colon (image 48/2) with adjacent soft tissue stranding consistent
with acute diverticulitis. No bowel perforation or abscess
identified.

Vascular/Lymphatic: There are no enlarged abdominal or pelvic lymph
nodes. No significant vascular findings on noncontrast imaging.
Retroaortic left renal vein.

Reproductive: The uterus and ovaries appear normal. No adnexal mass.

Other: No ascites or free air.  Intact anterior abdominal wall.

Musculoskeletal: No acute or significant osseous findings.
IMPRESSION: 1. Findings are consistent with uncomplicated descending colonic
diverticulitis. No evidence of bowel perforation or abscess.
2. Hepatic steatosis without focal abnormality or biliary dilatation
post cholecystectomy.
3. 3 mm right lower lobe pulmonary nodule. This appearance is almost
certainly benign, and no dedicated follow-up is required if this
patient is low risk for bronchogenic carcinoma (and has no known or
suspected primary neoplasm). Non-contrast chest CT can be considered
in 12 months if patient is high-risk. This recommendation follows
the consensus statement: Guidelines for Management of Incidental
Pulmonary Nodules Detected on CT Images: From the [HOSPITAL]
4. These results will be called to the ordering clinician or
representative by the Radiologist Assistant, and communication
documented in the PACS or zVision Dashboard.

## 2019-10-30 ENCOUNTER — Other Ambulatory Visit: Payer: Self-pay | Admitting: Podiatry

## 2019-12-29 ENCOUNTER — Other Ambulatory Visit: Payer: Self-pay | Admitting: Primary Care

## 2019-12-29 DIAGNOSIS — G4700 Insomnia, unspecified: Secondary | ICD-10-CM

## 2020-01-08 ENCOUNTER — Other Ambulatory Visit: Payer: Self-pay | Admitting: Primary Care

## 2020-01-08 DIAGNOSIS — F4323 Adjustment disorder with mixed anxiety and depressed mood: Secondary | ICD-10-CM

## 2020-02-05 ENCOUNTER — Other Ambulatory Visit: Payer: Self-pay

## 2020-02-05 ENCOUNTER — Encounter: Payer: Self-pay | Admitting: Family Medicine

## 2020-02-05 ENCOUNTER — Telehealth (INDEPENDENT_AMBULATORY_CARE_PROVIDER_SITE_OTHER): Payer: 59 | Admitting: Family Medicine

## 2020-02-05 DIAGNOSIS — J019 Acute sinusitis, unspecified: Secondary | ICD-10-CM | POA: Insufficient documentation

## 2020-02-05 DIAGNOSIS — J0110 Acute frontal sinusitis, unspecified: Secondary | ICD-10-CM | POA: Diagnosis not present

## 2020-02-05 HISTORY — DX: Acute sinusitis, unspecified: J01.90

## 2020-02-05 MED ORDER — AMOXICILLIN-POT CLAVULANATE 875-125 MG PO TABS: 1.0000 | ORAL_TABLET | Freq: Two times a day (BID) | ORAL | 0 refills | Status: AC

## 2020-02-05 MED ORDER — FLUTICASONE PROPIONATE 50 MCG/ACT NA SUSP: 2.0000 | Freq: Every day | NASAL | 1 refills | Status: DC

## 2020-02-05 NOTE — Progress Notes (Signed)
Virtual visit completed through MyChart, a video enabled telemedicine application. Due to national recommendations of social distancing due to COVID-19, a virtual visit is felt to be most appropriate for this patient at this time. Reviewed limitations, risks, security and privacy concerns of performing a virtual visit and the availability of in person appointments. I also reviewed that there may be a patient responsible charge related to this service. The patient agreed to proceed.   Patient location: home Provider location: [place] at [facility], office Persons participating in this virtual visit: patient, provider  If any vitals were documented, they were collected by patient at home unless specified below.    BP 132/84   Pulse 90   Temp 98 F (36.7 C)   Ht 4' 11.5" (1.511 m)   Wt 146 lb (66.2 kg)   LMP 12/02/2012   BMI 28.99 kg/m    CC: cough, congestion Subjective:    Patient ID: Krista Carpenter, female    DOB: [birth year], 54 y.o.   MRN: [id number]  HPI: Krista Carpenter is a 54 y.o. female presenting on 02/05/2020 for Cough (C/o cough with thick yellow mucous, head congestion, sore throat, nausea and body aches.  Sxs started 02/01/20.  Had COVID test at [facility] on 02/03/20, neg results 02/04/20.)   5d h/o ST, sinus congestion, muffled hearing, frontal sinus pressure HA L>R and L maxillary sinus pressure, nausea with mild diarrhea and lightheadedness. Coughing up yellow mucous. Chills and myalgias at night time. Gums feel swollen behind upper front teeth. Head > chest congestion. No throat exudates.   No fevers, tooth pain, PNDrainage, abd pain. No dyspnea or wheezing.   Treating with alka seltzer plus cold medication as well as tylenol and ibuprofen.  Symptoms plateuing.   Currently taking celebrex 200mg  bid for plantar fasciitis.   No sick contacts at home.  No smokers at home.  No h/o asthma.  Requests note for work.   COVID tested negative on 02/03/2020 (PCR)      Relevant past medical, surgical, family and social history reviewed and updated as indicated. Interim medical history since our last visit reviewed. Allergies and medications reviewed and updated. Outpatient Medications Prior to Visit  Medication Sig Dispense Refill  . celecoxib (CELEBREX) 200 MG capsule TAKE 1 CAPSULE BY MOUTH TWICE A DAY 60 capsule 3  . COMBIPATCH 0.05-0.14 MG/DAY Place 1 patch onto the skin 2 (two) times a week.    03-17-1996 FLUoxetine (PROZAC) 40 MG capsule TAKE 1 CAPSULE BY MOUTH DAILY FOR ANXIETY AND DEPRESSION 30 capsule 5  . losartan (COZAAR) 100 MG tablet Take 1 tablet (100 mg total) by mouth daily. For blood pressure. 90 tablet 3  . PREMARIN vaginal cream Insert 0.5 applicatorful every day by vaginal route for two weeks then to twice a week    . traZODone (DESYREL) 50 MG tablet TAKE 1.5 TABLETS (75 MG TOTAL) BY MOUTH AT BEDTIME AS NEEDED FOR SLEEP. 45 tablet 5  . ALPRAZolam (XANAX) 0.25 MG tablet Take 0.25 mg by mouth at bedtime as needed for anxiety.      No facility-administered medications prior to visit.     Per HPI unless specifically indicated in ROS section below Review of Systems Objective:  BP 132/84   Pulse 90   Temp 98 F (36.7 C)   Ht 4' 11.5" (1.511 m)   Wt 146 lb (66.2 kg)   LMP 12/02/2012   BMI 28.99 kg/m   Wt Readings from Last 3 Encounters:  02/05/20  146 lb (66.2 kg)  05/22/19 153 lb 8 oz (69.6 kg)  05/08/19 154 lb 6.4 oz (70 kg)       Physical exam: Gen: alert, NAD, not ill appearing Pulm: speaks in complete sentences without increased work of breathing, some hoarseness Psych: normal mood, normal thought content      Assessment & Plan:   Problem List Items Addressed This Visit    Acute sinusitis    Anticipate viral given short duration. She does have localizing symptoms to left sinuses. Reviewed conservative measures - rec start OTC flonase along with further supportive care at home. WASP for augmentin sent to pharmacy with  indi9cations when to fill - worsening symptoms or persistence of symptoms past 7-10 days. Letter for work provided via [employer].  Rx's called in due to E prescribing being down.       Relevant Medications   amoxicillin-clavulanate (AUGMENTIN) 875-125 MG tablet   fluticasone (FLONASE) 50 MCG/ACT nasal spray       Meds ordered this encounter  Medications  . amoxicillin-clavulanate (AUGMENTIN) 875-125 MG tablet    Sig: Take 1 tablet by mouth 2 (two) times daily for 10 days.    Dispense:  20 tablet    Refill:  0  . fluticasone (FLONASE) 50 MCG/ACT nasal spray    Sig: Place 2 sprays into both nostrils daily.    Dispense:  16 g    Refill:  1   No orders of the defined types were placed in this encounter.   I discussed the assessment and treatment plan with the patient. The patient was provided an opportunity to ask questions and all were answered. The patient agreed with the plan and demonstrated an understanding of the instructions. The patient was advised to call back or seek an in-person evaluation if the symptoms worsen or if the condition fails to improve as anticipated.  Follow up plan: Return if symptoms worsen or fail to improve.  Eustaquio Boyden, MD

## 2020-02-05 NOTE — Assessment & Plan Note (Addendum)
Anticipate viral given short duration. She does have localizing symptoms to left sinuses. Reviewed conservative measures - rec start OTC flonase along with further supportive care at home. WASP for augmentin sent to pharmacy with indi9cations when to fill - worsening symptoms or persistence of symptoms past 7-10 days. Letter for work provided via [employer].  Rx's called in due to E prescribing being down.

## 2020-02-19 ENCOUNTER — Other Ambulatory Visit: Payer: Self-pay | Admitting: Internal Medicine

## 2020-02-22 DIAGNOSIS — N159 Renal tubulo-interstitial disease, unspecified: Secondary | ICD-10-CM

## 2020-02-22 HISTORY — DX: Renal tubulo-interstitial disease, unspecified: N15.9

## 2020-03-01 ENCOUNTER — Ambulatory Visit: Payer: 59 | Admitting: Primary Care

## 2020-03-01 ENCOUNTER — Other Ambulatory Visit: Payer: Self-pay | Admitting: Primary Care

## 2020-03-01 ENCOUNTER — Other Ambulatory Visit: Payer: Self-pay

## 2020-03-01 ENCOUNTER — Encounter: Payer: Self-pay | Admitting: Primary Care

## 2020-03-01 VITALS — BP 122/60 | HR 100 | Temp 97.8°F | Ht 59.5 in | Wt 145.0 lb

## 2020-03-01 DIAGNOSIS — K5792 Diverticulitis of intestine, part unspecified, without perforation or abscess without bleeding: Secondary | ICD-10-CM | POA: Diagnosis not present

## 2020-03-01 DIAGNOSIS — R300 Dysuria: Secondary | ICD-10-CM

## 2020-03-01 LAB — POC URINALSYSI DIPSTICK (AUTOMATED)
Bilirubin, UA: NEGATIVE
Glucose, UA: NEGATIVE
Ketones, UA: NEGATIVE
Nitrite, UA: POSITIVE
Protein, UA: POSITIVE — AB
Spec Grav, UA: 1.01 (ref 1.010–1.025)
Urobilinogen, UA: 0.2 E.U./dL
pH, UA: 6 (ref 5.0–8.0)

## 2020-03-01 MED ORDER — CIPROFLOXACIN HCL 500 MG PO TABS: 500.0000 mg | ORAL_TABLET | Freq: Two times a day (BID) | ORAL | 0 refills | Status: DC

## 2020-03-01 MED ORDER — METRONIDAZOLE 500 MG PO TABS: 500.0000 mg | ORAL_TABLET | Freq: Three times a day (TID) | ORAL | 0 refills | Status: DC

## 2020-03-01 NOTE — Progress Notes (Signed)
Subjective:    Patient ID: Krista Carpenter, female    DOB: Aug 28, 1965, 54 y.o.   MRN: [id number]  HPI  This visit occurred during the SARS-CoV-2 public health emergency.  Safety protocols were in place, including screening questions prior to the visit, additional usage of staff PPE, and extensive cleaning of exam room while observing appropriate contact time as indicated for disinfecting solutions.   Krista Carpenter is a 54 year old female with a history of diverticulosis with diverticulitis, IBS, hypertension, acute cystitis, migraine, prediabetes who presents today with a chief complaint of abdominal pain and dysuria.  Two days ago she started feeling fatigued, not herself. The following morning she woke up with body aches, chills, dysuria, frequency, and a fever of 101.3. Yesterday she began to notice left mid quadrant abdominal pain and left flank pain without radiation. She's noticed six episodes of non bloody diarrhea over the last 24 hours, occurs only with meals.   She tested for Covid-19 and is awaiting results, she is fully vaccinated. She's been taking Tylenol for fevers, last fever was 100 today. She denies hematuria, vomiting.  BP Readings from Last 3 Encounters:  03/01/20 122/60  02/05/20 132/84  06/12/19 132/85     Review of Systems  Constitutional: Positive for chills, fatigue and fever.  Gastrointestinal: Positive for diarrhea.  Genitourinary: Positive for dysuria and frequency.  Musculoskeletal: Positive for myalgias.       Past Medical History:  Diagnosis Date  . Acute diverticulitis   . Diverticula of colon    sigmoid and descending colon  . Diverticulitis 11/04/2018  . History of abnormal cervical Pap smear   . IBS (irritable bowel syndrome)   . Migraine      Social History   Socioeconomic History  . Marital status: Married    Spouse name: Not on file  . Number of children: Not on file  . Years of education: Not on file  . Highest education level: Not  on file  Occupational History  . Not on file  Tobacco Use  . Smoking status: Never Smoker  . Smokeless tobacco: Never Used  Vaping Use  . Vaping Use: Never used  Substance and Sexual Activity  . Alcohol use: Yes    Alcohol/week: 0.0 standard drinks    Comment: occasion  . Drug use: No  . Sexual activity: Yes    Partners: Male    Birth control/protection: I.U.D.  Other Topics Concern  . Not on file  Social History Narrative   Married.   2 children.   Works at [employer].   Enjoys scrapbooking, reading.   Social Determinants of Health   Financial Resource Strain:   . Difficulty of Paying Living Expenses: Not on file  Food Insecurity:   . Worried About Programme researcher, broadcasting/film/video in the Last Year: Not on file  . Ran Out of Food in the Last Year: Not on file  Transportation Needs:   . Lack of Transportation (Medical): Not on file  . Lack of Transportation (Non-Medical): Not on file  Physical Activity:   . Days of Exercise per Week: Not on file  . Minutes of Exercise per Session: Not on file  Stress:   . Feeling of Stress : Not on file  Social Connections:   . Frequency of Communication with Friends and Family: Not on file  . Frequency of Social Gatherings with Friends and Family: Not on file  . Attends Religious Services: Not on file  . Active Member  of Clubs or Organizations: Not on file  . Attends Banker Meetings: Not on file  . Marital Status: Not on file  Intimate Partner Violence:   . Fear of Current or Ex-Partner: Not on file  . Emotionally Abused: Not on file  . Physically Abused: Not on file  . Sexually Abused: Not on file    Past Surgical History:  Procedure Laterality Date  . CHOLECYSTECTOMY    . colonoscopy    . TONSILLECTOMY    . WRIST ARTHROCENTESIS      Family History  Problem Relation Age of Onset  . Cancer Mother        skin  . Diabetes Father   . Diabetes Brother     No Known Allergies  Current Outpatient Medications on File  Prior to Visit  Medication Sig Dispense Refill  . celecoxib (CELEBREX) 200 MG capsule TAKE 1 CAPSULE BY MOUTH TWICE A DAY 60 capsule 3  . COMBIPATCH 0.05-0.14 MG/DAY Place 1 patch onto the skin 2 (two) times a week.    Marland Kitchen FLUoxetine (PROZAC) 40 MG capsule TAKE 1 CAPSULE BY MOUTH DAILY FOR ANXIETY AND DEPRESSION 30 capsule 5  . losartan (COZAAR) 100 MG tablet Take 1 tablet (100 mg total) by mouth daily. For blood pressure. 90 tablet 3  . PREMARIN vaginal cream Insert 0.5 applicatorful every day by vaginal route for two weeks then to twice a week    . traZODone (DESYREL) 50 MG tablet TAKE 1.5 TABLETS (75 MG TOTAL) BY MOUTH AT BEDTIME AS NEEDED FOR SLEEP. 45 tablet 5   No current facility-administered medications on file prior to visit.    BP 122/60   Pulse 100   Temp 97.8 F (36.6 C) (Temporal)   Ht 4' 11.5" (1.511 m)   Wt 145 lb (65.8 kg)   LMP 12/02/2012   SpO2 98%   BMI 28.80 kg/m    Objective:   Physical Exam Constitutional:      General: She is not in acute distress.    Appearance: She is ill-appearing.  Cardiovascular:     Rate and Rhythm: Normal rate and regular rhythm.  Pulmonary:     Effort: Pulmonary effort is normal.     Breath sounds: Normal breath sounds.  Abdominal:     General: Abdomen is flat.     Palpations: Abdomen is soft.     Tenderness: There is no abdominal tenderness. There is no right CVA tenderness or left CVA tenderness.  Musculoskeletal:     Cervical back: Neck supple.  Skin:    General: Skin is warm and dry.  Neurological:     Mental Status: She is alert.            Assessment & Plan:

## 2020-03-01 NOTE — Patient Instructions (Addendum)
Stop by the lab prior to leaving today. I will notify you of your results once received.   Please start Cipro 500mg  twice daily as well as Flagyl 500mg  three times daily for the next 7 days.   Ensure you are consuming 64 ounces of water daily.   Please update me with any changes, worsening of pain or fever.     Urinary Tract Infection, Adult A urinary tract infection (UTI) is an infection of any part of the urinary tract. The urinary tract includes:  The kidneys.  The ureters.  The bladder.  The urethra. These organs make, store, and get rid of pee (urine) in the body. What are the causes? This is caused by germs (bacteria) in your genital area. These germs grow and cause swelling (inflammation) of your urinary tract. What increases the risk? You are more likely to develop this condition if:  You have a small, thin tube (catheter) to drain pee.  You cannot control when you pee or poop (incontinence).  You are female, and: ? You use these methods to prevent pregnancy:  A medicine that kills sperm (spermicide).  A device that blocks sperm (diaphragm). ? You have low levels of a female hormone (estrogen). ? You are pregnant.  You have genes that add to your risk.  You are sexually active.  You take antibiotic medicines.  You have trouble peeing because of: ? A prostate that is bigger than normal, if you are female. ? A blockage in the part of your body that drains pee from the bladder (urethra). ? A kidney stone. ? A nerve condition that affects your bladder (neurogenic bladder). ? Not getting enough to drink. ? Not peeing often enough.  You have other conditions, such as: ? Diabetes. ? A weak disease-fighting system (immune system). ? Sickle cell disease. ? Gout. ? Injury of the spine. What are the signs or symptoms? Symptoms of this condition include:  Needing to pee right away (urgently).  Peeing often.  Peeing small amounts often.  Pain or burning  when peeing.  Blood in the pee.  Pee that smells bad or not like normal.  Trouble peeing.  Pee that is cloudy.  Fluid coming from the vagina, if you are female.  Pain in the belly or lower back. Other symptoms include:  Throwing up (vomiting).  No urge to eat.  Feeling mixed up (confused).  Being tired and grouchy (irritable).  A fever.  Watery poop (diarrhea). How is this treated? This condition may be treated with:  Antibiotic medicine.  Other medicines.  Drinking enough water. Follow these instructions at home:  Medicines  Take over-the-counter and prescription medicines only as told by your doctor.  If you were prescribed an antibiotic medicine, take it as told by your doctor. Do not stop taking it even if you start to feel better. General instructions  Make sure you: ? Pee until your bladder is empty. ? Do not hold pee for a long time. ? Empty your bladder after sex. ? Wipe from front to back after pooping if you are a female. Use each tissue one time when you wipe.  Drink enough fluid to keep your pee pale yellow.  Keep all follow-up visits as told by your doctor. This is important. Contact a doctor if:  You do not get better after 1-2 days.  Your symptoms go away and then come back. Get help right away if:  You have very bad back pain.  You have very bad  pain in your lower belly.  You have a fever.  You are sick to your stomach (nauseous).  You are throwing up. Summary  A urinary tract infection (UTI) is an infection of any part of the urinary tract.  This condition is caused by germs in your genital area.  There are many risk factors for a UTI. These include having a small, thin tube to drain pee and not being able to control when you pee or poop.  Treatment includes antibiotic medicines for germs.  Drink enough fluid to keep your pee pale yellow. This information is not intended to replace advice given to you by your health care  provider. Make sure you discuss any questions you have with your health care provider. Document Revised: 03/27/2018 Document Reviewed: 10/17/2017 Elsevier Patient Education  2020 [employer].

## 2020-03-01 NOTE — Progress Notes (Signed)
   Subjective:    Patient ID: Krista Carpenter, female    DOB: 14-Jun-1965, 54 y.o.   MRN: [id number]  HPI   This visit occurred during the SARS-CoV-2 public health emergency.  Safety protocols were in place, including screening questions prior to the visit, additional usage of staff PPE, and extensive cleaning of exam room while observing appropriate contact time as indicated for disinfecting solutions.   [name] is a 54 year old female with a history of migraines, hypertension, prediabetes, hyperlipidemia and diverticulitis who presents today with a chief complaint of diverticulitis flare.   She started feeling unwell on Sunday with dysuria and urinary frequency. She woke up Sunday night will chills and shaking. She took her temperature Monday morning and was 101.3 with body aches. [facility], does not have those results. She has left sided mid-lower abd pain that began yesterday and is intermittent. This pain is radiating into left flank as well. Deceased appetite and with eating will have diarrhea. She has had about 5-6 episodes of diarrhea. She is nauseous no vomiting. She thinks this feels somewhat similar to her diverticulitis  flare but the pain is higher and that radiation into her back is new.     She had a CT scan on 09/2018 which showed inflamed diverticulum in the mid descending colon, at that time had acute diverticulitis in descending colon.   BP Readings from Last 3 Encounters:  03/01/20 122/60  02/05/20 132/84  06/12/19 132/85     Review of Systems  Constitutional: Negative.   Respiratory: Negative.  Negative for chest tightness and shortness of breath.   Cardiovascular: Negative.  Negative for chest pain.  Gastrointestinal: Positive for abdominal pain, diarrhea and nausea. Negative for blood in stool, constipation and vomiting.  Endocrine: Negative.   Genitourinary: Positive for dysuria and flank pain. Negative for hematuria and pelvic pain.    Skin: Negative.   Neurological: Negative.  Negative for dizziness.  Psychiatric/Behavioral: Negative.        Objective:   Physical Exam Cardiovascular:     Rate and Rhythm: Normal rate.  Pulmonary:     Breath sounds: Normal breath sounds.  Abdominal:     General: Bowel sounds are normal. There is no distension.     Palpations: Abdomen is soft. There is no mass.     Tenderness: There is abdominal tenderness. There is no right CVA tenderness, left CVA tenderness or rebound. Negative signs include Murphy's sign.     Hernia: No hernia is present.    Musculoskeletal:        General: Normal range of motion.  Skin:    General: Skin is warm and dry.     Capillary Refill: Capillary refill takes less than 2 seconds.  Neurological:     General: No focal deficit present.     Mental Status: She is alert.           Assessment & Plan:

## 2020-03-01 NOTE — Assessment & Plan Note (Addendum)
Diagnosed in 2020, this abdominal pain feels similar to this flare. However some differences given dysuria & flank pain.   UA today with +3 Leuks, +nitrates, +3 blood.  Unclear exact cause differentials include pyelonephritis vs  Diverticulitis flare.  Will obtain CBC w diff & CMP   With history of diverticulitis and UA findings will treat with Cipro 500mg  BID for 7 days as well as Flagyl 500mg  TID for 7 days  Labs pending.   Consider CT scan if no improvement or worsening in abdominal pain.    Agree with assessment and plan. , NP

## 2020-03-01 NOTE — Assessment & Plan Note (Addendum)
Acute and associated with fever & left abdomen/flank pain UA today with +3 Leuks, +nitrates, +3 blood.   Will obtain CBC w diff & CMP to rule out pyelonephritis With history of diverticulitis will treat with Cipro 500mg  BID for 7 days as well as Flagyl 500mg  TID for 7 days to cover pyelo verus diverticulitis.   Agree with assessment and plan. , NP

## 2020-03-01 NOTE — Telephone Encounter (Signed)
Spoke with [facility] who has plenty of ciprofloxacin 500 mg.  Spoke with patient via phone, she is fine with going to [facility]. New prescription for ciprofloxacin 500 mg sent to [facility].

## 2020-03-02 ENCOUNTER — Emergency Department: Payer: 59

## 2020-03-02 ENCOUNTER — Encounter: Payer: Self-pay | Admitting: Emergency Medicine

## 2020-03-02 ENCOUNTER — Other Ambulatory Visit: Payer: Self-pay | Admitting: Primary Care

## 2020-03-02 ENCOUNTER — Telehealth: Payer: Self-pay | Admitting: *Deleted

## 2020-03-02 ENCOUNTER — Other Ambulatory Visit: Payer: Self-pay

## 2020-03-02 ENCOUNTER — Inpatient Hospital Stay
Admission: EM | Admit: 2020-03-02 | Discharge: 2020-03-07 | DRG: 871 | Disposition: A | Discharge status: Home / Self Care | Payer: 59 | Attending: Internal Medicine | Admitting: Internal Medicine

## 2020-03-02 ENCOUNTER — Other Ambulatory Visit (INDEPENDENT_AMBULATORY_CARE_PROVIDER_SITE_OTHER): Payer: 59

## 2020-03-02 DIAGNOSIS — N179 Acute kidney failure, unspecified: Secondary | ICD-10-CM | POA: Diagnosis present

## 2020-03-02 DIAGNOSIS — R7401 Elevation of levels of liver transaminase levels: Secondary | ICD-10-CM | POA: Diagnosis not present

## 2020-03-02 DIAGNOSIS — D649 Anemia, unspecified: Secondary | ICD-10-CM | POA: Diagnosis present

## 2020-03-02 DIAGNOSIS — N12 Tubulo-interstitial nephritis, not specified as acute or chronic: Secondary | ICD-10-CM | POA: Diagnosis not present

## 2020-03-02 DIAGNOSIS — R509 Fever, unspecified: Secondary | ICD-10-CM

## 2020-03-02 DIAGNOSIS — Z9049 Acquired absence of other specified parts of digestive tract: Secondary | ICD-10-CM | POA: Diagnosis not present

## 2020-03-02 DIAGNOSIS — E785 Hyperlipidemia, unspecified: Secondary | ICD-10-CM | POA: Diagnosis not present

## 2020-03-02 DIAGNOSIS — J9601 Acute respiratory failure with hypoxia: Secondary | ICD-10-CM | POA: Diagnosis present

## 2020-03-02 DIAGNOSIS — Z79899 Other long term (current) drug therapy: Secondary | ICD-10-CM | POA: Diagnosis not present

## 2020-03-02 DIAGNOSIS — G43909 Migraine, unspecified, not intractable, without status migrainosus: Secondary | ICD-10-CM | POA: Diagnosis present

## 2020-03-02 DIAGNOSIS — R531 Weakness: Secondary | ICD-10-CM | POA: Diagnosis not present

## 2020-03-02 DIAGNOSIS — I959 Hypotension, unspecified: Secondary | ICD-10-CM | POA: Diagnosis present

## 2020-03-02 DIAGNOSIS — K5730 Diverticulosis of large intestine without perforation or abscess without bleeding: Secondary | ICD-10-CM | POA: Diagnosis present

## 2020-03-02 DIAGNOSIS — A419 Sepsis, unspecified organism: Secondary | ICD-10-CM

## 2020-03-02 DIAGNOSIS — Z20822 Contact with and (suspected) exposure to covid-19: Secondary | ICD-10-CM | POA: Diagnosis present

## 2020-03-02 DIAGNOSIS — F419 Anxiety disorder, unspecified: Secondary | ICD-10-CM | POA: Diagnosis present

## 2020-03-02 DIAGNOSIS — G4700 Insomnia, unspecified: Secondary | ICD-10-CM | POA: Diagnosis present

## 2020-03-02 DIAGNOSIS — E876 Hypokalemia: Secondary | ICD-10-CM | POA: Diagnosis present

## 2020-03-02 DIAGNOSIS — R7989 Other specified abnormal findings of blood chemistry: Secondary | ICD-10-CM

## 2020-03-02 DIAGNOSIS — F4323 Adjustment disorder with mixed anxiety and depressed mood: Secondary | ICD-10-CM | POA: Diagnosis not present

## 2020-03-02 DIAGNOSIS — Z809 Family history of malignant neoplasm, unspecified: Secondary | ICD-10-CM | POA: Diagnosis not present

## 2020-03-02 DIAGNOSIS — N10 Acute tubulo-interstitial nephritis: Secondary | ICD-10-CM | POA: Diagnosis present

## 2020-03-02 DIAGNOSIS — R000 Tachycardia, unspecified: Secondary | ICD-10-CM | POA: Diagnosis present

## 2020-03-02 DIAGNOSIS — R109 Unspecified abdominal pain: Secondary | ICD-10-CM | POA: Diagnosis not present

## 2020-03-02 DIAGNOSIS — Z833 Family history of diabetes mellitus: Secondary | ICD-10-CM | POA: Diagnosis not present

## 2020-03-02 DIAGNOSIS — R6520 Severe sepsis without septic shock: Secondary | ICD-10-CM | POA: Diagnosis present

## 2020-03-02 DIAGNOSIS — J189 Pneumonia, unspecified organism: Secondary | ICD-10-CM | POA: Diagnosis not present

## 2020-03-02 DIAGNOSIS — K589 Irritable bowel syndrome without diarrhea: Secondary | ICD-10-CM | POA: Diagnosis present

## 2020-03-02 DIAGNOSIS — R7303 Prediabetes: Secondary | ICD-10-CM | POA: Diagnosis present

## 2020-03-02 DIAGNOSIS — I10 Essential (primary) hypertension: Secondary | ICD-10-CM | POA: Diagnosis present

## 2020-03-02 HISTORY — DX: Sepsis, unspecified organism: A41.9

## 2020-03-02 HISTORY — DX: Tubulo-interstitial nephritis, not specified as acute or chronic: N12

## 2020-03-02 HISTORY — DX: Acute kidney failure, unspecified: N17.9

## 2020-03-02 HISTORY — DX: Elevation of levels of liver transaminase levels: R74.01

## 2020-03-02 LAB — URINALYSIS, COMPLETE (UACMP) WITH MICROSCOPIC
Bilirubin Urine: NEGATIVE
Glucose, UA: NEGATIVE mg/dL
Ketones, ur: NEGATIVE mg/dL
Nitrite: NEGATIVE
Protein, ur: 100 mg/dL — AB
Specific Gravity, Urine: 1.014 (ref 1.005–1.030)
WBC, UA: >50 WBC/hpf — ABNORMAL HIGH (ref 0–5)
pH: 6 (ref 5.0–8.0)

## 2020-03-02 LAB — CBC WITH DIFFERENTIAL/PLATELET
Basophils Absolute: 0.1 10*3/uL (ref 0.0–0.1)
Basophils Relative: 0.4 % (ref 0.0–3.0)
Eosinophils Absolute: 0.3 10*3/uL (ref 0.0–0.7)
Eosinophils Relative: 1.6 % (ref 0.0–5.0)
HCT: 34.2 % — ABNORMAL LOW (ref 36.0–46.0)
Hemoglobin: 11.6 g/dL — ABNORMAL LOW (ref 12.0–15.0)
Lymphocytes Relative: 6.2 % — ABNORMAL LOW (ref 12.0–46.0)
Lymphs Abs: 1.1 10*3/uL (ref 0.7–4.0)
MCHC: 34 g/dL (ref 30.0–36.0)
MCV: 88.6 fl (ref 78.0–100.0)
Monocytes Absolute: 1.6 10*3/uL — ABNORMAL HIGH (ref 0.1–1.0)
Monocytes Relative: 8.4 % (ref 3.0–12.0)
Neutro Abs: 15.4 10*3/uL — ABNORMAL HIGH (ref 1.4–7.7)
Neutrophils Relative %: 83.4 % — ABNORMAL HIGH (ref 43.0–77.0)
Platelets: 225 10*3/uL (ref 150.0–400.0)
RBC: 3.86 Mil/uL — ABNORMAL LOW (ref 3.87–5.11)
RDW: 13.7 % (ref 11.5–15.5)
WBC: 18.5 10*3/uL (ref 4.0–10.5)

## 2020-03-02 LAB — URINE CULTURE
MICRO NUMBER:: 11179750
SPECIMEN QUALITY:: ADEQUATE

## 2020-03-02 LAB — HEPATIC FUNCTION PANEL
ALT: 127 U/L — ABNORMAL HIGH (ref 0–44)
AST: 52 U/L — ABNORMAL HIGH (ref 15–41)
Albumin: 3.8 g/dL (ref 3.5–5.0)
Alkaline Phosphatase: 126 U/L (ref 38–126)
Bilirubin, Direct: 0.1 mg/dL (ref 0.0–0.2)
Indirect Bilirubin: 0.5 mg/dL (ref 0.3–0.9)
Total Bilirubin: 0.6 mg/dL (ref 0.3–1.2)
Total Protein: 7.7 g/dL (ref 6.5–8.1)

## 2020-03-02 LAB — COMPREHENSIVE METABOLIC PANEL
ALT: 195 U/L — ABNORMAL HIGH (ref 0–35)
AST: 125 U/L — ABNORMAL HIGH (ref 0–37)
Albumin: 4.2 g/dL (ref 3.5–5.2)
Alkaline Phosphatase: 121 U/L — ABNORMAL HIGH (ref 39–117)
BUN: 25 mg/dL — ABNORMAL HIGH (ref 6–23)
CO2: 23 mEq/L (ref 19–32)
Calcium: 8.9 mg/dL (ref 8.4–10.5)
Chloride: 102 mEq/L (ref 96–112)
Creatinine, Ser: 1.34 mg/dL — ABNORMAL HIGH (ref 0.40–1.20)
GFR: 45.41 mL/min — ABNORMAL LOW (ref >60.00)
Glucose, Bld: 81 mg/dL (ref 70–99)
Potassium: 3.7 mEq/L (ref 3.5–5.1)
Sodium: 136 mEq/L (ref 135–145)
Total Bilirubin: 0.5 mg/dL (ref 0.2–1.2)
Total Protein: 7.2 g/dL (ref 6.0–8.3)

## 2020-03-02 LAB — GASTROINTESTINAL PANEL BY PCR, STOOL (REPLACES STOOL CULTURE)

## 2020-03-02 LAB — CBC
HCT: 36.5 % (ref 36.0–46.0)
Hemoglobin: 12.2 g/dL (ref 12.0–15.0)
MCH: 29.8 pg (ref 26.0–34.0)
MCHC: 33.4 g/dL (ref 30.0–36.0)
MCV: 89.2 fL (ref 80.0–100.0)
Platelets: 223 10*3/uL (ref 150–400)
RBC: 4.09 MIL/uL (ref 3.87–5.11)
RDW: 13.2 % (ref 11.5–15.5)
WBC: 11.9 10*3/uL — ABNORMAL HIGH (ref 4.0–10.5)
nRBC: 0 % (ref 0.0–0.2)

## 2020-03-02 LAB — BASIC METABOLIC PANEL
Anion gap: 11 (ref 5–15)
BUN: 24 mg/dL — ABNORMAL HIGH (ref 6–20)
CO2: 22 mmol/L (ref 22–32)
Calcium: 8.9 mg/dL (ref 8.9–10.3)
Chloride: 102 mmol/L (ref 98–111)
Creatinine, Ser: 1.37 mg/dL — ABNORMAL HIGH (ref 0.44–1.00)
GFR, Estimated: 46 mL/min — ABNORMAL LOW (ref >60)
Glucose, Bld: 120 mg/dL — ABNORMAL HIGH (ref 70–99)
Potassium: 3.7 mmol/L (ref 3.5–5.1)
Sodium: 135 mmol/L (ref 135–145)

## 2020-03-02 LAB — CK: Total CK: 34 U/L — ABNORMAL LOW (ref 38–234)

## 2020-03-02 LAB — RESPIRATORY PANEL BY RT PCR (FLU A&B, COVID)
Influenza A by PCR: NEGATIVE
Influenza B by PCR: NEGATIVE
SARS Coronavirus 2 by RT PCR: NEGATIVE

## 2020-03-02 LAB — C DIFFICILE QUICK SCREEN W PCR REFLEX
C Diff antigen: NEGATIVE
C Diff interpretation: NOT DETECTED
C Diff toxin: NEGATIVE

## 2020-03-02 LAB — LIPASE: Lipase: 28 U/L (ref 11.0–59.0)

## 2020-03-02 LAB — LIPASE, BLOOD: Lipase: 20 U/L (ref 11–51)

## 2020-03-02 MED ORDER — ACETAMINOPHEN 325 MG PO TABS
650.0000 mg | ORAL_TABLET | Freq: Four times a day (QID) | ORAL | Status: DC | PRN
  Administered 2020-03-02 – 2020-03-04 (×4): 650 mg via ORAL
  Filled 2020-03-02 (×4): qty 2

## 2020-03-02 MED ORDER — FLUOXETINE HCL 20 MG PO CAPS
40.0000 mg | ORAL_CAPSULE | Freq: Every day | ORAL | Status: DC
  Administered 2020-03-03 – 2020-03-07 (×5): 40 mg via ORAL
  Filled 2020-03-02 (×5): qty 2

## 2020-03-02 MED ORDER — LOPERAMIDE HCL 2 MG PO CAPS
2.0000 mg | ORAL_CAPSULE | ORAL | Status: DC | PRN
  Administered 2020-03-02 – 2020-03-03 (×2): 2 mg via ORAL
  Filled 2020-03-02 (×2): qty 1

## 2020-03-02 MED ORDER — IOHEXOL 9 MG/ML PO SOLN
1000.0000 mL | Freq: Once | ORAL | Status: DC | PRN
  Administered 2020-03-02: 1000 mL via ORAL
  Filled 2020-03-02: qty 1000

## 2020-03-02 MED ORDER — ONDANSETRON 4 MG PO TBDP: 4.0000 mg | ORAL_TABLET | Freq: Three times a day (TID) | ORAL | 0 refills | Status: DC | PRN

## 2020-03-02 MED ORDER — ONDANSETRON HCL 4 MG/2ML IJ SOLN
4.0000 mg | Freq: Four times a day (QID) | INTRAMUSCULAR | Status: DC | PRN
  Administered 2020-03-02 – 2020-03-03 (×2): 4 mg via INTRAVENOUS
  Filled 2020-03-02 (×2): qty 2

## 2020-03-02 MED ORDER — TRAZODONE HCL 50 MG PO TABS
75.0000 mg | ORAL_TABLET | Freq: Every evening | ORAL | Status: DC | PRN
  Administered 2020-03-03: 75 mg via ORAL
  Filled 2020-03-02: qty 2

## 2020-03-02 MED ORDER — SODIUM CHLORIDE 0.9 % IV BOLUS
1000.0000 mL | Freq: Once | INTRAVENOUS | Status: AC
  Administered 2020-03-02: 1000 mL via INTRAVENOUS

## 2020-03-02 MED ORDER — IBUPROFEN 600 MG PO TABS
600.0000 mg | ORAL_TABLET | Freq: Once | ORAL | Status: AC
  Administered 2020-03-02: 600 mg via ORAL
  Filled 2020-03-02 (×2): qty 1

## 2020-03-02 MED ORDER — SODIUM CHLORIDE 0.9 % IV SOLN
1.0000 g | Freq: Once | INTRAVENOUS | Status: AC
  Administered 2020-03-02: 1 g via INTRAVENOUS
  Filled 2020-03-02: qty 10

## 2020-03-02 MED ORDER — IOHEXOL 300 MG/ML  SOLN
100.0000 mL | Freq: Once | INTRAMUSCULAR | Status: AC | PRN
  Administered 2020-03-02: 100 mL via INTRAVENOUS

## 2020-03-02 MED ORDER — ACETAMINOPHEN 500 MG PO TABS
ORAL_TABLET | ORAL | Status: AC
  Administered 2020-03-02: 1000 mg
  Filled 2020-03-02: qty 2

## 2020-03-02 MED ORDER — ONDANSETRON HCL 4 MG/2ML IJ SOLN
4.0000 mg | Freq: Once | INTRAMUSCULAR | Status: AC
  Administered 2020-03-02: 4 mg via INTRAVENOUS
  Filled 2020-03-02: qty 2

## 2020-03-02 MED ORDER — SODIUM CHLORIDE 0.9 % IV SOLN: INTRAVENOUS | Status: DC

## 2020-03-02 MED ORDER — HYDRALAZINE HCL 25 MG PO TABS: 25.0000 mg | ORAL_TABLET | Freq: Four times a day (QID) | ORAL | Status: DC | PRN

## 2020-03-02 MED ORDER — CEPHALEXIN 500 MG PO CAPS: 500.0000 mg | ORAL_CAPSULE | Freq: Three times a day (TID) | ORAL | 0 refills | Status: DC

## 2020-03-02 MED ORDER — SODIUM CHLORIDE 0.9 % IV SOLN: Freq: Once | INTRAVENOUS | Status: AC

## 2020-03-02 MED ORDER — ONDANSETRON HCL 4 MG PO TABS
4.0000 mg | ORAL_TABLET | Freq: Four times a day (QID) | ORAL | Status: DC | PRN
  Administered 2020-03-03 – 2020-03-04 (×2): 4 mg via ORAL
  Filled 2020-03-02 (×2): qty 1

## 2020-03-02 MED ORDER — ENOXAPARIN SODIUM 40 MG/0.4ML ~~LOC~~ SOLN
40.0000 mg | SUBCUTANEOUS | Status: DC
  Administered 2020-03-03 – 2020-03-07 (×5): 40 mg via SUBCUTANEOUS
  Filled 2020-03-02 (×5): qty 0.4

## 2020-03-02 MED ORDER — TRAMADOL HCL 50 MG PO TABS
50.0000 mg | ORAL_TABLET | Freq: Three times a day (TID) | ORAL | Status: DC | PRN
  Administered 2020-03-04 – 2020-03-05 (×2): 50 mg via ORAL
  Filled 2020-03-02 (×2): qty 1

## 2020-03-02 MED ORDER — SODIUM CHLORIDE 0.9 % IV SOLN
2.0000 g | INTRAVENOUS | Status: DC
  Administered 2020-03-03 – 2020-03-05 (×3): 2 g via INTRAVENOUS
  Filled 2020-03-02: qty 20
  Filled 2020-03-02 (×2): qty 2

## 2020-03-02 MED ORDER — ACETAMINOPHEN 500 MG PO TABS: 1000.0000 mg | ORAL_TABLET | Freq: Once | ORAL | Status: AC

## 2020-03-02 NOTE — ED Provider Notes (Signed)
Patient received in signout from Dr. Doristine Bosworth pending CT imaging work-up.  CT imaging shows evidence of pyelonephritis.  Also having watery stools that are without evidence of infectious etiology.  She was given Rocephin as well as IV fluids.  Was mildly tachycardic.  Still having decreased p.o. intake and generalized malaise.  She is a bit puny appearing and after further observation still spiking fevers.  Repeat temperature at 100.4 orally heart rate 1 teens will give additional IV fluids.  Given her decreased p.o. intake in the setting of pyelonephritis will discuss with hospitalist for admission for IV hydration and further medical management.   Willy Eddy, MD 03/02/20 2038

## 2020-03-02 NOTE — Telephone Encounter (Signed)
Called [facility] triage and they are aware and will be looking out for pt

## 2020-03-02 NOTE — ED Provider Notes (Signed)
[facility] Emergency Department Provider Note  Time seen: 1:18 PM  I have reviewed the triage vital signs and the nursing notes.   HISTORY  Chief Complaint Abnormal lab work  HPI Krista Carpenter is a 54 y.o. female with a past medical history of hypertension hyperlipidemia, presents emergency department with abnormal lab work.  According to the patient over the past week or so she has been feeling  fatigue and weak.  Patient went to her PCP and was diagnosed with a possible urinary tract infection was started on antibiotics.  Patient states she has been having mild left lower quadrant abdominal pain she is also been experiencing nausea vomiting and diarrhea over the past day or so.  Patient denies any dysuria.  Patient states she was called back today saying her white blood cell count and liver enzymes were very elevated and she needed to go to the emergency department.  Patient denies any shortness of breath or cough.  Patient states she was recently tested for Covid and it was negative.  Describes her left lower quadrant abdominal pain is mild dull type cramping pain.  Past Medical History:  Diagnosis Date  . Acute diverticulitis   . Diverticula of colon    sigmoid and descending colon  . Diverticulitis 11/04/2018  . History of abnormal cervical Pap smear   . IBS (irritable bowel syndrome)   . Migraine     Patient Active Problem List   Diagnosis Date Noted  . Dysuria 03/01/2020  . Acute sinusitis 02/05/2020  . Insomnia 06/12/2019  . Essential hypertension 05/08/2019  . Diverticulitis 11/04/2018  . Preventative health care 05/20/2018  . Prediabetes 05/20/2018  . Hyperlipidemia 05/20/2018  . Adjustment disorder with mixed anxiety and depressed mood 05/31/2015  . Overweight(278.02) 12/18/2011  . Migraine 05/08/2011    Past Surgical History:  Procedure Laterality Date  . CHOLECYSTECTOMY    . colonoscopy    . TONSILLECTOMY    . WRIST ARTHROCENTESIS       Prior to Admission medications   Medication Sig Start Date End Date Taking? Authorizing Provider  celecoxib (CELEBREX) 200 MG capsule TAKE 1 CAPSULE BY MOUTH TWICE A DAY 11/06/19   Hyatt, Max T, DPM  ciprofloxacin (CIPRO) 500 MG tablet Take 1 tablet (500 mg total) by mouth 2 (two) times daily for 7 days. 03/01/20 03/08/20  [name], NP  COMBIPATCH 0.05-0.14 MG/DAY Place 1 patch onto the skin 2 (two) times a week. 06/19/19   [provider]  FLUoxetine (PROZAC) 40 MG capsule TAKE 1 CAPSULE BY MOUTH DAILY FOR ANXIETY AND DEPRESSION 01/11/20   [name], NP  losartan (COZAAR) 100 MG tablet Take 1 tablet (100 mg total) by mouth daily. For blood pressure. 06/12/19   [name], NP  metroNIDAZOLE (FLAGYL) 500 MG tablet Take 1 tablet (500 mg total) by mouth 3 (three) times daily for 7 days. For possible diverticulitis flare 03/01/20 03/08/20  [name], NP  PREMARIN vaginal cream Insert 0.5 applicatorful every day by vaginal route for two weeks then to twice a week 06/19/19   [provider]  traZODone (DESYREL) 50 MG tablet TAKE 1.5 TABLETS (75 MG TOTAL) BY MOUTH AT BEDTIME AS NEEDED FOR SLEEP. 12/29/19   [name], NP    No Known Allergies  Family History  Problem Relation Age of Onset  . Cancer Mother        skin  . Diabetes Father   . Diabetes Brother  Social History Social History   Tobacco Use  . Smoking status: Never Smoker  . Smokeless tobacco: Never Used  Vaping Use  . Vaping Use: Never used  Substance Use Topics  . Alcohol use: Yes    Alcohol/week: 0.0 standard drinks    Comment: occasion  . Drug use: No    Review of Systems Constitutional: Negative for fever. Cardiovascular: Negative for chest pain. Respiratory: Negative for shortness of breath. Gastrointestinal: Mild left lower quadrant abdominal pain.  Positive for nausea vomiting diarrhea. Genitourinary: Negative for urinary compaints Musculoskeletal:  Negative for musculoskeletal complaints Neurological: Negative for headache All other ROS negative  ____________________________________________   PHYSICAL EXAM:  VITAL SIGNS: ED Triage Vitals [03/02/20 1229]  Enc Vitals Group     BP (!) 159/93     Pulse Rate (!) 118     Resp 18     Temp 99.5 F (37.5 C)     Temp Source Oral     SpO2 98 %     Weight 145 lb (65.8 kg)     Height 4\' 11"  (1.499 m)     Head Circumference      Peak Flow      Pain Score 8     Pain Loc      Pain Edu?      Excl. in GC?    Constitutional: Alert and oriented. Well appearing and in no distress. Eyes: Normal exam ENT      Head: Normocephalic and atraumatic.      Mouth/Throat: Mucous membranes are moist. Cardiovascular: Normal rate, regular rhythm Respiratory: Normal respiratory effort without tachypnea nor retractions. Breath sounds are clear Gastrointestinal: Soft, mild left lower quadrant tenderness palpation, moderate left-sided CVA tenderness palpation.  No rebound guarding or distention. Musculoskeletal: Nontender with normal range of motion in all extremities.  Neurologic:  Normal speech and language. No gross focal neurologic deficits  Skin:  Skin is warm, dry and intact.  Psychiatric: Mood and affect are normal.   ____________________________________________     RADIOLOGY  CT pending  ____________________________________________   INITIAL IMPRESSION / ASSESSMENT AND PLAN / ED COURSE  Pertinent labs & imaging results that were available during my care of the patient were reviewed by me and considered in my medical decision making (see chart for details).   Patient presents emergency department for abdominal pain nausea vomiting diarrhea elevated liver function test and elevated white blood cell count from her PCP.  Overall the patient appears well, no acute distress.  I reviewed the patient's outpatient record and she did have transaminitis as well as a white blood cell count of  18,000.  Patient's white blood cell count today is 11.9, currently awaiting LFTs.  Patient has no right-sided abdominal tenderness with special attention paid to the right upper quadrant.  Patient is status post cholecystectomy.  Patient's urine does show red and white cells could be indicative of a urinary tract infection, urine culture has been sent.  We will check a Covid swab, currently awaiting LFT results for today.  Patient does have mild renal insufficiency compared to baseline we will IV hydrate and proceed with a CT scan abdomen/pelvis to further evaluate.  Patient agreeable to plan of care.  CT pending. Patient care signed out to oncoming physician.  Krista Carpenter was evaluated in Emergency Department on 03/02/2020 for the symptoms described in the history of present illness. She was evaluated in the context of the global COVID-19 pandemic, which necessitated consideration that the patient might  be at risk for infection with the SARS-CoV-2 virus that causes COVID-19. Institutional protocols and algorithms that pertain to the evaluation of patients at risk for COVID-19 are in a state of rapid change based on information released by regulatory bodies including the CDC and federal and state organizations. These policies and algorithms were followed during the patient's care in the ED.  ____________________________________________   FINAL CLINICAL IMPRESSION(S) / ED DIAGNOSES  Abdominal pain Nausea vomiting diarrhea   Minna Antis, MD 03/03/20 2155

## 2020-03-02 NOTE — ED Triage Notes (Signed)
Pt comes into the ED via POV c/o abnormal labs with increased liver enzymes and WBC.  Pt states she went to the MD thinking she had a UTI and diverticulitis.  Pt was confirmed for UTI and then they called her today and told her to come here due to the abnormal labs.  Pt is still having left flank pain.  Pt ambulatory to triage.  Pt currently having N/V/D.  Pt in NAd with even and unlabored respirations.

## 2020-03-02 NOTE — Telephone Encounter (Signed)
Clydie Braun from the lab called with a critical on pt. Pt's WBC count is 18.5, results given to [facility]

## 2020-03-02 NOTE — ED Triage Notes (Signed)
First RN Note: Pt presents to ED via POV, states was sent by provider, pt states was seen at PCP for possible UTI, was called by provider and referred to ED due to elevated WBC and liver enzymes. Pt states was sent for IV abx and CT scan.

## 2020-03-02 NOTE — Telephone Encounter (Signed)
Spoke with patient via phone to discuss lab results. She is feeling worse, continued fevers, increased flank pain with radiation to LLQ.   Given her labs, progression of symptoms, she needs stat imaging and likely IV antibiotics. She will go to [facility] ED now.  Shawna Orleans, can you call report? Moderate leukocytosis, elevated liver enzymes, fevers, left flank pain, LLQ abdominal pain. Feeling worse. Have them look at labs in Epic. Needs STAT CT scan and IV antibiotics. Rule out acute pyelonephritis vs diverticulitis vs ?

## 2020-03-02 NOTE — H&P (Signed)
[facility] History and Physical   Patient: Krista Carpenter SWF:[id number]   PCP: [name], NP DOB: February 16, 1966   DOA: 03/02/2020   DOS: 03/02/2020   DOS: the patient was seen and examined on 03/02/2020   Patient coming from: The patient is coming from Home  Chief Complaint: Left flank abdominal pain, nausea vomiting and fever  HPI: Krista Carpenter is a 54 y.o. female with Past medical history of HTN, HLD, depression, prediabetic, diverticulosis, IBS, insomnia, migraine as reviewed from EMR, presented at [facility] ED with complaining of fever, nausea vomiting diarrhea and abdominal pain.  As per patient she started feeling sick on Sunday, developed fever on Monday, patient felt she might be getting UTI, she got Covid test done which was negative.  On Tuesday patient went to primary care physician and she got antibiotics for UTI, patient was having diarrhea and left lower quadrant pain so she thought maybe she is having diverticulitis.  Patient's condition got worse today and she presently had vomiting more than 5 episodes.  Patient got blood work done and she was informed that her WBC count and LFTs are high so she should go to the ED.   ED Course: Left flank tenderness, elevated WBC count, transaminitis, AKI creatinine 1.37, tachycardic, hypoxic and hypotensive. CT scan abdomen positive for left-sided pyelonephritis Patient was given IV antibiotics ceftriaxone and IV fluids.  Admitted for further management as below   Review of Systems: as mentioned in the history of present illness.  All other systems reviewed and are negative.  Past Medical History:  Diagnosis Date  . Acute diverticulitis   . Diverticula of colon    sigmoid and descending colon  . Diverticulitis 11/04/2018  . History of abnormal cervical Pap smear   . IBS (irritable bowel syndrome)   . Migraine    Past Surgical History:  Procedure Laterality Date  . CHOLECYSTECTOMY    . colonoscopy    . TONSILLECTOMY     . WRIST ARTHROCENTESIS     Social History:  reports that she has never smoked. She has never used smokeless tobacco. She reports current alcohol use. She reports that she does not use drugs.  No Known Allergies   Family history reviewed and not pertinent Family History  Problem Relation Age of Onset  . Cancer Mother        skin  . Diabetes Father   . Diabetes Brother      Prior to Admission medications   Medication Sig Start Date End Date Taking? Authorizing Provider  celecoxib (CELEBREX) 200 MG capsule TAKE 1 CAPSULE BY MOUTH TWICE A DAY 11/06/19   Hyatt, Max T, DPM  cephALEXin (KEFLEX) 500 MG capsule Take 1 capsule (500 mg total) by mouth 3 (three) times daily for 7 days. 03/02/20 03/09/20  Willy Eddy, MD  COMBIPATCH 0.05-0.14 MG/DAY Place 1 patch onto the skin 2 (two) times a week. 06/19/19   [provider]  FLUoxetine (PROZAC) 40 MG capsule TAKE 1 CAPSULE BY MOUTH DAILY FOR ANXIETY AND DEPRESSION 01/11/20   [name], NP  losartan (COZAAR) 100 MG tablet Take 1 tablet (100 mg total) by mouth daily. For blood pressure. 06/12/19   [name], NP  ondansetron (ZOFRAN ODT) 4 MG disintegrating tablet Take 1 tablet (4 mg total) by mouth every 8 (eight) hours as needed for nausea or vomiting. 03/02/20   Willy Eddy, MD  PREMARIN vaginal cream Insert 0.5 applicatorful every day by vaginal route for two weeks  then to twice a week 06/19/19   [provider]  traZODone (DESYREL) 50 MG tablet TAKE 1.5 TABLETS (75 MG TOTAL) BY MOUTH AT BEDTIME AS NEEDED FOR SLEEP. 12/29/19   [name], NP    Physical Exam: Vitals:   03/02/20 1500 03/02/20 1715 03/02/20 1730 03/02/20 1900  BP: 129/77 137/84 118/70 120/66  Pulse: (!) 108 (!) 107 98 93  Resp: 16   18  Temp:      TempSrc:      SpO2: 98% 99% 99% 98%  Weight:      Height:        General: alert and oriented to time, place, and person. Appear in moderate distress, affect  appropriate Eyes: PERRLA, Conjunctiva normal ENT: Oral Mucosa Clear, moist  Neck: no JVD, no Abnormal Mass Or lumps Cardiovascular: S1 and S2 Present, no Murmur, peripheral pulses symmetrical Respiratory: good respiratory effort, Bilateral Air entry equal and Decreased, no signs of accessory muscle use, Clear to Auscultation, no Crackles, no wheezes Abdomen: Bowel Sound present, Soft and left flank tenderness, bilateral CVA tenderness greater on the left side. Skin: no rashes  Extremities: no Pedal edema, no calf tenderness Neurologic: without any new focal findings Gait not checked due to patient safety concerns  Data Reviewed: I have personally reviewed and interpreted labs, imaging as discussed below.  CBC: Recent Labs  Lab 03/01/20 1452 03/02/20 1231  WBC 18.5 Repeated and verified X2.* 11.9*  NEUTROABS 15.4*  --   HGB 11.6* 12.2  HCT 34.2* 36.5  MCV 88.6 89.2  PLT 225.0 223   Basic Metabolic Panel: Recent Labs  Lab 03/01/20 1452 03/02/20 1231  NA 136 135  K 3.7 3.7  CL 102 102  CO2 23 22  GLUCOSE 81 120*  BUN 25* 24*  CREATININE 1.34* 1.37*  CALCIUM 8.9 8.9   GFR: Estimated Creatinine Clearance: 39.1 mL/min (A) (by C-G formula based on SCr of 1.37 mg/dL (H)). Liver Function Tests: Recent Labs  Lab 03/01/20 1452 03/02/20 1231  AST 125* 52*  ALT 195* 127*  ALKPHOS 121* 126  BILITOT 0.5 0.6  PROT 7.2 7.7  ALBUMIN 4.2 3.8   Recent Labs  Lab 03/02/20 1131 03/02/20 1231  LIPASE 28.0 20   No results for input(s): AMMONIA in the last 168 hours. Coagulation Profile: No results for input(s): INR, PROTIME in the last 168 hours. Cardiac Enzymes: Recent Labs  Lab 03/02/20 1231  CKTOTAL 34*   BNP (last 3 results) No results for input(s): PROBNP in the last 8760 hours. HbA1C: No results for input(s): HGBA1C in the last 72 hours. CBG: No results for input(s): GLUCAP in the last 168 hours. Lipid Profile: No results for input(s): CHOL, HDL, LDLCALC,  TRIG, CHOLHDL, LDLDIRECT in the last 72 hours. Thyroid Function Tests: No results for input(s): TSH, T4TOTAL, FREET4, T3FREE, THYROIDAB in the last 72 hours. Anemia Panel: No results for input(s): VITAMINB12, FOLATE, FERRITIN, TIBC, IRON, RETICCTPCT in the last 72 hours. Urine analysis:    Component Value Date/Time   COLORURINE YELLOW (A) 03/02/2020 1231   APPEARANCEUR HAZY (A) 03/02/2020 1231   LABSPEC 1.014 03/02/2020 1231   PHURINE 6.0 03/02/2020 1231   GLUCOSEU NEGATIVE 03/02/2020 1231   HGBUR LARGE (A) 03/02/2020 1231   BILIRUBINUR NEGATIVE 03/02/2020 1231   BILIRUBINUR neg 03/01/2020 1421   KETONESUR NEGATIVE 03/02/2020 1231   PROTEINUR 100 (A) 03/02/2020 1231   UROBILINOGEN 0.2 03/01/2020 1421   NITRITE NEGATIVE 03/02/2020 1231   LEUKOCYTESUR SMALL (A) 03/02/2020 1231  Radiological Exams on Admission: CT ABDOMEN PELVIS W CONTRAST  Result Date: 03/02/2020 CLINICAL DATA:  Left sided lower abdominal and pelvic pain, elevated white blood cell count, urinary tract infection EXAM: CT ABDOMEN AND PELVIS WITH CONTRAST TECHNIQUE: Multidetector CT imaging of the abdomen and pelvis was performed using the standard protocol following bolus administration of intravenous contrast. CONTRAST:  OMNIPAQUE IOHEXOL 300 MG/ML  SOLN COMPARISON:  10/15/2018 FINDINGS: Lower chest: No acute pleural or parenchymal lung disease. Hepatobiliary: No focal liver abnormality is seen. Status post cholecystectomy. No biliary dilatation. Pancreas: Unremarkable. No pancreatic ductal dilatation or surrounding inflammatory changes. Spleen: Normal in size without focal abnormality. Adrenals/Urinary Tract: The left kidney is enlarged and edematous, with heterogeneous enhancement and a striated nephrogram appearance consistent with diffuse left-sided pyelonephritis. No evidence of renal abscess. The right kidney enhances normally. The adrenals are unremarkable. Bladder is unremarkable. Stomach/Bowel: No bowel  obstruction or ileus. Minimal diverticulosis of the distal colon without diverticulitis. Normal appendix right lower quadrant. No bowel wall thickening or inflammatory change. Vascular/Lymphatic: No significant vascular findings are present. No enlarged abdominal or pelvic lymph nodes. Reproductive: Uterus and bilateral adnexa are unremarkable. Other: No free fluid or free gas. Small bilateral fat containing inguinal hernias unchanged. No bowel herniation. Musculoskeletal: No acute or destructive bony lesions. Reconstructed images demonstrate no additional findings. IMPRESSION: 1. Diffuse left-sided pyelonephritis.  No evidence of renal abscess. 2. Minimal distal colonic diverticulosis without diverticulitis. Electronically Signed   By: Sharlet Salina M.D.   On: 03/02/2020 15:55   DG Chest Portable 1 View  Result Date: 03/02/2020 CLINICAL DATA:  Elevated white blood cell count and liver enzymes. EXAM: PORTABLE CHEST 1 VIEW COMPARISON:  PA and lateral chest 05/29/2015. FINDINGS: Lungs clear. Heart size normal. No pneumothorax or pleural fluid. No bony abnormality. IMPRESSION: Normal chest. Electronically Signed   By: Drusilla Kanner M.D.   On: 03/02/2020 14:28      I reviewed all nursing notes, pharmacy notes, vitals, pertinent old records.  Assessment/Plan  Principal Problem:   Pyelonephritis of left kidney Active Problems:   Migraine   Adjustment disorder with mixed anxiety and depressed mood   Prediabetes   Hyperlipidemia   Essential hypertension   Insomnia   Sepsis (HCC)   AKI (acute kidney injury) (HCC)   Transaminitis    # Sepsis secondary to pyelonephritis Patient presented with tachycardia, elevated WBC count, AKI and transaminitis Continue IV fluid for hydration Monitor vital signs   # Left-sided pyelonephritis CT a/p left pyelonephritis without abscess Started ceftriaxone 2 g IV daily Follow urine culture and blood culture Trend WBC count and temperature curve   #  AKI secondary to sepsis due to pyelonephritis Continue IV fluid for hydration Monitor renal function and urine output   # Transaminitis secondary to sepsis due to pyelonephritis Continue IV fluid and monitor LFTs  # HTN, HLD Continue to monitor blood pressure and use hydralazine as needed Held losartan for now due to risk of hypotension secondary to sepsis  # Depression, continue Prozac # Insomnia, continue trazodone as needed # Migraine, continue as needed Tylenol  # History of diverticulosis and IBS, patient developed diarrhea secondary to pyelonephritis C. difficile and GI pathogen negative Started Imodium as needed    Nutrition: Carb modified diet DVT Prophylaxis: Subcutaneous Lovenox  Advance goals of care discussion: Full code   Consults: none  Family Communication: family was present at bedside, at the time of interview.  Opportunity was given to ask question and all questions were  answered satisfactorily.  Disposition: Admitted as inpatient,med-surge unit. Likely to be discharged home, in 1-2 days.  I have discussed plan of care as described above with RN and patient/family.  Severity of Illness: The appropriate patient status for this patient is INPATIENT. Inpatient status is judged to be reasonable and necessary in order to provide the required intensity of service to ensure the patient's safety. The patient's presenting symptoms, physical exam findings, and initial radiographic and laboratory data in the context of their chronic comorbidities is felt to place them at high risk for further clinical deterioration. Furthermore, it is not anticipated that the patient will be medically stable for discharge from the hospital within 2 midnights of admission. The following factors support the patient status of inpatient.   " The patient's presenting symptoms include abd pain  " The worrisome physical exam findings include left flank tenderness. " The initial radiographic  and laboratory data are worrisome because of left-sided pyelonephritis.    * I certify that at the point of admission it is my clinical judgment that the patient will require inpatient hospital care spanning beyond 2 midnights from the point of admission due to high intensity of service, high risk for further deterioration and high frequency of surveillance required.*    Author: Gillis SantaILEEP Isom Kochan, MD [facility] 03/02/2020 9:05 PM   To reach On-call, see care teams to locate the attending and reach out to them via [email or web address]. If 7PM-7AM, please contact night-coverage If you still have difficulty reaching the attending provider, please page the [facility] (Director on Call) for [facility] on amion for assistance.

## 2020-03-02 NOTE — ED Notes (Signed)
Pt desat into 80's. MD aware

## 2020-03-03 DIAGNOSIS — N179 Acute kidney failure, unspecified: Secondary | ICD-10-CM

## 2020-03-03 DIAGNOSIS — R7401 Elevation of levels of liver transaminase levels: Secondary | ICD-10-CM

## 2020-03-03 DIAGNOSIS — A419 Sepsis, unspecified organism: Principal | ICD-10-CM

## 2020-03-03 DIAGNOSIS — I10 Essential (primary) hypertension: Secondary | ICD-10-CM

## 2020-03-03 LAB — COMPREHENSIVE METABOLIC PANEL
ALT: 82 U/L — ABNORMAL HIGH (ref 0–44)
AST: 30 U/L (ref 15–41)
Albumin: 3.1 g/dL — ABNORMAL LOW (ref 3.5–5.0)
Alkaline Phosphatase: 110 U/L (ref 38–126)
Anion gap: 10 (ref 5–15)
BUN: 16 mg/dL (ref 6–20)
CO2: 17 mmol/L — ABNORMAL LOW (ref 22–32)
Calcium: 7.5 mg/dL — ABNORMAL LOW (ref 8.9–10.3)
Chloride: 108 mmol/L (ref 98–111)
Creatinine, Ser: 1.04 mg/dL — ABNORMAL HIGH (ref 0.44–1.00)
GFR, Estimated: >60 mL/min (ref >60)
Glucose, Bld: 99 mg/dL (ref 70–99)
Potassium: 3.4 mmol/L — ABNORMAL LOW (ref 3.5–5.1)
Sodium: 135 mmol/L (ref 135–145)
Total Bilirubin: 0.7 mg/dL (ref 0.3–1.2)
Total Protein: 6.5 g/dL (ref 6.5–8.1)

## 2020-03-03 LAB — CBC
HCT: 28.9 % — ABNORMAL LOW (ref 36.0–46.0)
Hemoglobin: 9.5 g/dL — ABNORMAL LOW (ref 12.0–15.0)
MCH: 29.5 pg (ref 26.0–34.0)
MCHC: 32.9 g/dL (ref 30.0–36.0)
MCV: 89.8 fL (ref 80.0–100.0)
Platelets: 197 10*3/uL (ref 150–400)
RBC: 3.22 MIL/uL — ABNORMAL LOW (ref 3.87–5.11)
RDW: 13.2 % (ref 11.5–15.5)
WBC: 10 10*3/uL (ref 4.0–10.5)
nRBC: 0 % (ref 0.0–0.2)

## 2020-03-03 LAB — URINE CULTURE

## 2020-03-03 LAB — LACTIC ACID, PLASMA: Lactic Acid, Venous: 0.9 mmol/L (ref 0.5–1.9)

## 2020-03-03 MED ORDER — ALUM & MAG HYDROXIDE-SIMETH 200-200-20 MG/5ML PO SUSP
30.0000 mL | Freq: Four times a day (QID) | ORAL | Status: DC | PRN
  Administered 2020-03-03: 30 mL via ORAL
  Filled 2020-03-03: qty 30

## 2020-03-03 MED ORDER — PANTOPRAZOLE SODIUM 40 MG PO TBEC
40.0000 mg | DELAYED_RELEASE_TABLET | Freq: Every day | ORAL | Status: DC
  Administered 2020-03-03 – 2020-03-07 (×5): 40 mg via ORAL
  Filled 2020-03-03 (×5): qty 1

## 2020-03-03 MED ORDER — SIMETHICONE 80 MG PO CHEW: 80.0000 mg | CHEWABLE_TABLET | Freq: Four times a day (QID) | ORAL | Status: DC | PRN

## 2020-03-03 MED ORDER — DICYCLOMINE HCL 10 MG PO CAPS
10.0000 mg | ORAL_CAPSULE | Freq: Three times a day (TID) | ORAL | Status: DC
  Administered 2020-03-03 – 2020-03-07 (×12): 10 mg via ORAL
  Filled 2020-03-03 (×14): qty 1

## 2020-03-03 MED ORDER — LOPERAMIDE HCL 2 MG PO CAPS
4.0000 mg | ORAL_CAPSULE | ORAL | Status: DC | PRN
  Administered 2020-03-03: 4 mg via ORAL
  Filled 2020-03-03: qty 2

## 2020-03-03 NOTE — Progress Notes (Signed)
Mobility Specialist - Progress Note   03/03/20 1500  Mobility  Activity Contraindicated/medical hold  Mobility performed by Mobility specialist    Per discussion with nurse, pt has been experiencing nausea this date. Will hold off on session and attempt when pt is appropriate.    Filiberto Pinks Mobility Specialist 03/03/20, 3:57 PM

## 2020-03-03 NOTE — Progress Notes (Signed)
PROGRESS NOTE  Krista Carpenter AYT:[id number] DOB: [birth year] DOA: 03/02/2020 PCP: [name], NP  HPI/Recap of past 24 hours: HPI from Dr Lance Muss is a 54 y.o. female with past medical history of HTN, HLD, depression, prediabetic, diverticulosis, IBS, insomnia, migraine as reviewed from EMR, presented at [facility] ED with complaining of fever, nausea vomiting diarrhea and abdominal pain for the past few days. As per patient she started feeling sick, felt she might be getting UTI, she got Covid test done which was negative. Patient went to primary care physician and she got antibiotics for UTI, patient was having diarrhea and left lower quadrant pain so she thought maybe she is having diverticulitis.  Patient's condition got worse with intractable vomiting, diarrhea. Patient got blood work done and she was informed that her WBC count and LFTs are high so she should go to the ED. In the ED, noted L flank tenderness, elevated WBC count, transaminitis, AKI creatinine 1.37, tachycardic. CT abdomen positive for left-sided pyelonephritis. Patient was given IV antibiotics ceftriaxone and IV fluids.  Admitted for further management.    Today, patient continues to feel nauseous, still with multiple episodes of diarrhea, left flank pain as well as mid upper abdominal tenderness and significant bloating.  Discussed extensively with patient about overall medical condition   Assessment/Plan: Principal Problem:   Pyelonephritis of left kidney Active Problems:   Migraine   Adjustment disorder with mixed anxiety and depressed mood   Prediabetes   Hyperlipidemia   Essential hypertension   Insomnia   Sepsis (HCC)   AKI (acute kidney injury) (HCC)   Transaminitis   Severe sepsis 2/2 acute pyelonephritis On admission, tachycardic, febrile, elevated WBC, AKI, transaminitis Currently afebrile, with no leukocytosis BC x2 pending CT abdomen showed left pyelonephritis, without abscess UA  showed small leukocytes, large hemoglobin, negative nitrite, few bacteria, greater than 50 WBC (has received antibiotics prior to the sample), urine culture pending Continue IV ceftriaxone Continue IV fluids, pain management Monitor closely  AKI Improving Likely 2/2 above Continue IV fluids Daily CMP  Transaminitis Improved Likely 2/2 sepsis  History of IBS Likely ??IBS flare-continues to have significant diarrhea, bloating (could be related to antibiotics as well) C. difficile, GI panel negative Start Bentyl, PPI, Imodium as needed Advance diet as tolerated  Normal anion gap metabolic acidosis Likely 2/2 GI losses Continue IV fluids Daily CMP  Normocytic anemiaHypertension BP stable Hold home losartan due to AKI as well as hypotension risk IV hydralazine as needed  Depression Continue Prozac  Insomnia Continue trazodone       Malnutrition Type:      Malnutrition Characteristics:      Nutrition Interventions:       Estimated body mass index is 29.29 kg/m as calculated from the following:   Height as of this encounter: 4\' 11"  (1.499 m).   Weight as of this encounter: 65.8 kg.     Code Status: Full  Family Communication: Discussed extensively with patient  Disposition Plan: Status is: Inpatient  Remains inpatient appropriate because:Inpatient level of care appropriate due to severity of illness   Dispo: The patient is from: Home              Anticipated d/c is to: Home              Anticipated d/c date is: 2 days              Patient currently is not medically stable to d/c.  Consultants:  None  Procedures:  None  Antimicrobials:  Ceftriaxone  DVT prophylaxis: LovenoxLovenox   Objective: Vitals:   03/03/20 0036 03/03/20 0058 03/03/20 0452 03/03/20 0700  BP:  138/78 130/75 (!) 154/83  Pulse:  96 86 92  Resp:  16 18 18   Temp: 99.6 F (37.6 C) 97.9 F (36.6 C) 98.8 F (37.1 C) 99.8 F (37.7 C)  TempSrc: Oral Oral  Oral Oral  SpO2:  98% 97% 98%  Weight:      Height:        Intake/Output Summary (Last 24 hours) at 03/03/2020 1102 Last data filed at 03/03/2020 0900 Gross per 24 hour  Intake 120 ml  Output --  Net 120 ml   Filed Weights   03/02/20 1229  Weight: 65.8 kg    Exam:  General: NAD  Cardiovascular: S1, S2 present  Respiratory: CTAB  Abdomen: Soft, +tender, nondistended, bowel sounds present  Musculoskeletal: No bilateral pedal edema noted  Skin: Normal  Psychiatry: Normal mood   Data Reviewed: CBC: Recent Labs  Lab 03/01/20 1452 03/02/20 1231 03/03/20 0129  WBC 18.5 Repeated and verified X2.* 11.9* 10.0  NEUTROABS 15.4*  --   --   HGB 11.6* 12.2 9.5*  HCT 34.2* 36.5 28.9*  MCV 88.6 89.2 89.8  PLT 225.0 223 197   Basic Metabolic Panel: Recent Labs  Lab 03/01/20 1452 03/02/20 1231 03/03/20 0129  NA 136 135 135  K 3.7 3.7 3.4*  CL 102 102 108  CO2 23 22 17*  GLUCOSE 81 120* 99  BUN 25* 24* 16  CREATININE 1.34* 1.37* 1.04*  CALCIUM 8.9 8.9 7.5*   GFR: Estimated Creatinine Clearance: 51.6 mL/min (A) (by C-G formula based on SCr of 1.04 mg/dL (H)). Liver Function Tests: Recent Labs  Lab 03/01/20 1452 03/02/20 1231 03/03/20 0129  AST 125* 52* 30  ALT 195* 127* 82*  ALKPHOS 121* 126 110  BILITOT 0.5 0.6 0.7  PROT 7.2 7.7 6.5  ALBUMIN 4.2 3.8 3.1*   Recent Labs  Lab 03/02/20 1131 03/02/20 1231  LIPASE 28.0 20   No results for input(s): AMMONIA in the last 168 hours. Coagulation Profile: No results for input(s): INR, PROTIME in the last 168 hours. Cardiac Enzymes: Recent Labs  Lab 03/02/20 1231  CKTOTAL 34*   BNP (last 3 results) No results for input(s): PROBNP in the last 8760 hours. HbA1C: No results for input(s): HGBA1C in the last 72 hours. CBG: No results for input(s): GLUCAP in the last 168 hours. Lipid Profile: No results for input(s): CHOL, HDL, LDLCALC, TRIG, CHOLHDL, LDLDIRECT in the last 72 hours. Thyroid Function  Tests: No results for input(s): TSH, T4TOTAL, FREET4, T3FREE, THYROIDAB in the last 72 hours. Anemia Panel: No results for input(s): VITAMINB12, FOLATE, FERRITIN, TIBC, IRON, RETICCTPCT in the last 72 hours. Urine analysis:    Component Value Date/Time   COLORURINE YELLOW (A) 03/02/2020 1231   APPEARANCEUR HAZY (A) 03/02/2020 1231   LABSPEC 1.014 03/02/2020 1231   PHURINE 6.0 03/02/2020 1231   GLUCOSEU NEGATIVE 03/02/2020 1231   HGBUR LARGE (A) 03/02/2020 1231   BILIRUBINUR NEGATIVE 03/02/2020 1231   BILIRUBINUR neg 03/01/2020 1421   KETONESUR NEGATIVE 03/02/2020 1231   PROTEINUR 100 (A) 03/02/2020 1231   UROBILINOGEN 0.2 03/01/2020 1421   NITRITE NEGATIVE 03/02/2020 1231   LEUKOCYTESUR SMALL (A) 03/02/2020 1231   Sepsis Labs: @LABRCNTIP (procalcitonin:4,lacticidven:4)  ) Recent Results (from the past 240 hour(s))  Urine Culture     Status: None   Collection Time:  03/01/20  2:22 PM   Specimen: Urine  Result Value Ref Range Status   MICRO NUMBER: [id number]  Final   SPECIMEN QUALITY: Adequate  Final   Sample Source NOT GIVEN  Final   STATUS: FINAL  Final   ISOLATE 1:   Final    Growth of mixed flora was isolated, suggesting probable contamination. No further testing will be performed. If clinically indicated, recollection using a method to minimize contamination, with prompt transfer to Urine Culture Transport Tube, is  recommended.   Respiratory Panel by RT PCR (Flu A&B, Covid) - Nasopharyngeal Swab     Status: None   Collection Time: 03/02/20  1:14 PM   Specimen: Nasopharyngeal Swab  Result Value Ref Range Status   SARS Coronavirus 2 by RT PCR NEGATIVE NEGATIVE Final    Comment: (NOTE) SARS-CoV-2 target nucleic acids are NOT DETECTED.  The SARS-CoV-2 RNA is generally detectable in upper respiratoy specimens during the acute phase of infection. The lowest concentration of SARS-CoV-2 viral copies this assay can detect is 131 copies/mL. A negative result does not preclude  SARS-Cov-2 infection and should not be used as the sole basis for treatment or other patient management decisions. A negative result may occur with  improper specimen collection/handling, submission of specimen other than nasopharyngeal swab, presence of viral mutation(s) within the areas targeted by this assay, and inadequate number of viral copies (<131 copies/mL). A negative result must be combined with clinical observations, patient history, and epidemiological information. The expected result is Negative.  Fact Sheet for Patients:  https://www.moore.com/  Fact Sheet for Healthcare Providers:  https://www.young.biz/  This test is no t yet approved or cleared by the [place] FDA and  has been authorized for detection and/or diagnosis of SARS-CoV-2 by FDA under an Emergency Use Authorization (EUA). This EUA will remain  in effect (meaning this test can be used) for the duration of the COVID-19 declaration under Section 564(b)(1) of the Act, 21 U.S.C. section 360bbb-3(b)(1), unless the authorization is terminated or revoked sooner.     Influenza A by PCR NEGATIVE NEGATIVE Final   Influenza B by PCR NEGATIVE NEGATIVE Final    Comment: (NOTE) The Xpert Xpress SARS-CoV-2/FLU/RSV assay is intended as an aid in  the diagnosis of influenza from Nasopharyngeal swab specimens and  should not be used as a sole basis for treatment. Nasal washings and  aspirates are unacceptable for Xpert Xpress SARS-CoV-2/FLU/RSV  testing.  Fact Sheet for Patients: https://www.moore.com/  Fact Sheet for Healthcare Providers: https://www.young.biz/  This test is not yet approved or cleared by the [place] FDA and  has been authorized for detection and/or diagnosis of SARS-CoV-2 by  FDA under an Emergency Use Authorization (EUA). This EUA will remain  in effect (meaning this test can be used) for the duration of the    Covid-19 declaration under Section 564(b)(1) of the Act, 21  U.S.C. section 360bbb-3(b)(1), unless the authorization is  terminated or revoked. Performed at [facility], [address] [place], [place] [address]   Gastrointestinal Panel by PCR , Stool     Status: None   Collection Time: 03/02/20  5:54 PM   Specimen: Stool  Result Value Ref Range Status   Campylobacter species NOT DETECTED NOT DETECTED Final   Plesimonas shigelloides NOT DETECTED NOT DETECTED Final   Salmonella species NOT DETECTED NOT DETECTED Final   Yersinia enterocolitica NOT DETECTED NOT DETECTED Final   Vibrio species NOT DETECTED NOT DETECTED Final   Vibrio cholerae NOT  DETECTED NOT DETECTED Final   Enteroaggregative E coli (EAEC) NOT DETECTED NOT DETECTED Final   Enteropathogenic E coli (EPEC) NOT DETECTED NOT DETECTED Final   Enterotoxigenic E coli (ETEC) NOT DETECTED NOT DETECTED Final   Shiga like toxin producing E coli (STEC) NOT DETECTED NOT DETECTED Final   Shigella/Enteroinvasive E coli (EIEC) NOT DETECTED NOT DETECTED Final   Cryptosporidium NOT DETECTED NOT DETECTED Final   Cyclospora cayetanensis NOT DETECTED NOT DETECTED Final   Entamoeba histolytica NOT DETECTED NOT DETECTED Final   Giardia lamblia NOT DETECTED NOT DETECTED Final   Adenovirus F40/41 NOT DETECTED NOT DETECTED Final   Astrovirus NOT DETECTED NOT DETECTED Final   Norovirus GI/GII NOT DETECTED NOT DETECTED Final   Rotavirus A NOT DETECTED NOT DETECTED Final   Sapovirus (I, II, IV, and V) NOT DETECTED NOT DETECTED Final    Comment: Performed at [facility], [address] [place], [place] [address]  C Difficile Quick Screen w PCR reflex     Status: None   Collection Time: 03/02/20  7:12 PM   Specimen: Stool  Result Value Ref Range Status   C Diff antigen NEGATIVE NEGATIVE Final   C Diff toxin NEGATIVE NEGATIVE Final   C Diff interpretation No C. difficile detected.  Final    Comment: Performed at [facility], [address] [place], [place] [address]  Blood culture (routine x 2)     Status: None (Preliminary result)   Collection Time: 03/02/20  9:50 PM   Specimen: BLOOD  Result Value Ref Range Status   Specimen Description BLOOD RIGHT WRIST  Final   Special Requests   Final    BOTTLES DRAWN AEROBIC AND ANAEROBIC Blood Culture adequate volume   Culture   Final    NO GROWTH < 12 HOURS Performed at [facility], [address] [place], [place] [address]    Report Status PENDING  Incomplete  Blood culture (routine x 2)     Status: None (Preliminary result)   Collection Time: 03/02/20  9:50 PM   Specimen: BLOOD  Result Value Ref Range Status   Specimen Description BLOOD LEFT ASSIST CONTROL  Final   Special Requests   Final    BOTTLES DRAWN AEROBIC AND ANAEROBIC Blood Culture adequate volume   Culture   Final    NO GROWTH < 12 HOURS Performed at [facility], [address], [place], [place] [address]    Report Status PENDING  Incomplete      Studies: CT ABDOMEN PELVIS W CONTRAST  Result Date: 03/02/2020 CLINICAL DATA:  Left sided lower abdominal and pelvic pain, elevated white blood cell count, urinary tract infection EXAM: CT ABDOMEN AND PELVIS WITH CONTRAST TECHNIQUE: Multidetector CT imaging of the abdomen and pelvis was performed using the standard protocol following bolus administration of intravenous contrast. CONTRAST:  OMNIPAQUE IOHEXOL 300 MG/ML  SOLN COMPARISON:  10/15/2018 FINDINGS: Lower chest: No acute pleural or parenchymal lung disease. Hepatobiliary: No focal liver abnormality is seen. Status post cholecystectomy. No biliary dilatation. Pancreas: Unremarkable. No pancreatic ductal dilatation or surrounding inflammatory changes. Spleen: Normal in size without focal abnormality. Adrenals/Urinary Tract: The left kidney is enlarged and edematous, with heterogeneous enhancement and a striated nephrogram appearance consistent with diffuse  left-sided pyelonephritis. No evidence of renal abscess. The right kidney enhances normally. The adrenals are unremarkable. Bladder is unremarkable. Stomach/Bowel: No bowel obstruction or ileus. Minimal diverticulosis of the distal colon without diverticulitis. Normal appendix right lower quadrant. No bowel  wall thickening or inflammatory change. Vascular/Lymphatic: No significant vascular findings are present. No enlarged abdominal or pelvic lymph nodes. Reproductive: Uterus and bilateral adnexa are unremarkable. Other: No free fluid or free gas. Small bilateral fat containing inguinal hernias unchanged. No bowel herniation. Musculoskeletal: No acute or destructive bony lesions. Reconstructed images demonstrate no additional findings. IMPRESSION: 1. Diffuse left-sided pyelonephritis.  No evidence of renal abscess. 2. Minimal distal colonic diverticulosis without diverticulitis. Electronically Signed   By: Sharlet Salina M.D.   On: 03/02/2020 15:55   DG Chest Portable 1 View  Result Date: 03/02/2020 CLINICAL DATA:  Elevated white blood cell count and liver enzymes. EXAM: PORTABLE CHEST 1 VIEW COMPARISON:  PA and lateral chest 05/29/2015. FINDINGS: Lungs clear. Heart size normal. No pneumothorax or pleural fluid. No bony abnormality. IMPRESSION: Normal chest. Electronically Signed   By: Drusilla Kanner M.D.   On: 03/02/2020 14:28    Scheduled Meds: . dicyclomine  10 mg Oral TID AC  . enoxaparin (LOVENOX) injection  40 mg Subcutaneous Q24H  . FLUoxetine  40 mg Oral Daily  . pantoprazole  40 mg Oral Daily    Continuous Infusions: . sodium chloride 100 mL/hr at 03/03/20 0736  . cefTRIAXone (ROCEPHIN)  IV 2 g (03/03/20 0956)     LOS: 1 day     Briant Cedar, MD [facility]  If 7PM-7AM, please contact night-coverage [email or web address] 03/03/2020, 11:02 AM

## 2020-03-04 LAB — CBC WITH DIFFERENTIAL/PLATELET
Abs Immature Granulocytes: 0.07 10*3/uL (ref 0.00–0.07)
Basophils Absolute: 0 10*3/uL (ref 0.0–0.1)
Basophils Relative: 1 %
Eosinophils Absolute: 0.1 10*3/uL (ref 0.0–0.5)
Eosinophils Relative: 1 %
HCT: 28 % — ABNORMAL LOW (ref 36.0–46.0)
Hemoglobin: 9.3 g/dL — ABNORMAL LOW (ref 12.0–15.0)
Immature Granulocytes: 1 %
Lymphocytes Relative: 19 %
Lymphs Abs: 1.6 10*3/uL (ref 0.7–4.0)
MCH: 29.4 pg (ref 26.0–34.0)
MCHC: 33.2 g/dL (ref 30.0–36.0)
MCV: 88.6 fL (ref 80.0–100.0)
Monocytes Absolute: 1.3 10*3/uL — ABNORMAL HIGH (ref 0.1–1.0)
Monocytes Relative: 16 %
Neutro Abs: 5.2 10*3/uL (ref 1.7–7.7)
Neutrophils Relative %: 62 %
Platelets: 238 10*3/uL (ref 150–400)
RBC: 3.16 MIL/uL — ABNORMAL LOW (ref 3.87–5.11)
RDW: 13.9 % (ref 11.5–15.5)
WBC: 8.2 10*3/uL (ref 4.0–10.5)
nRBC: 0 % (ref 0.0–0.2)

## 2020-03-04 LAB — COMPREHENSIVE METABOLIC PANEL
ALT: 246 U/L — ABNORMAL HIGH (ref 0–44)
AST: 190 U/L — ABNORMAL HIGH (ref 15–41)
Albumin: 2.8 g/dL — ABNORMAL LOW (ref 3.5–5.0)
Alkaline Phosphatase: 259 U/L — ABNORMAL HIGH (ref 38–126)
Anion gap: 6 (ref 5–15)
BUN: 15 mg/dL (ref 6–20)
CO2: 21 mmol/L — ABNORMAL LOW (ref 22–32)
Calcium: 7.6 mg/dL — ABNORMAL LOW (ref 8.9–10.3)
Chloride: 108 mmol/L (ref 98–111)
Creatinine, Ser: 1.26 mg/dL — ABNORMAL HIGH (ref 0.44–1.00)
GFR, Estimated: 51 mL/min — ABNORMAL LOW (ref >60)
Glucose, Bld: 100 mg/dL — ABNORMAL HIGH (ref 70–99)
Potassium: 3.4 mmol/L — ABNORMAL LOW (ref 3.5–5.1)
Sodium: 135 mmol/L (ref 135–145)
Total Bilirubin: 0.7 mg/dL (ref 0.3–1.2)
Total Protein: 6.2 g/dL — ABNORMAL LOW (ref 6.5–8.1)

## 2020-03-04 LAB — PROTIME-INR
INR: 1.2 (ref 0.8–1.2)
Prothrombin Time: 14.4 seconds (ref 11.4–15.2)

## 2020-03-04 LAB — HEPATITIS PANEL, ACUTE
HCV Ab: NONREACTIVE
Hep A IgM: NONREACTIVE
Hep B C IgM: NONREACTIVE
Hepatitis B Surface Ag: NONREACTIVE

## 2020-03-04 MED ORDER — BUTALBITAL-APAP-CAFFEINE 50-325-40 MG PO TABS
1.0000 | ORAL_TABLET | Freq: Three times a day (TID) | ORAL | Status: DC | PRN
  Administered 2020-03-04 – 2020-03-07 (×5): 1 via ORAL
  Filled 2020-03-04 (×5): qty 1

## 2020-03-04 MED ORDER — TRAZODONE HCL 50 MG PO TABS
50.0000 mg | ORAL_TABLET | Freq: Every evening | ORAL | Status: DC | PRN
  Administered 2020-03-05: 50 mg via ORAL
  Filled 2020-03-04: qty 1

## 2020-03-04 MED ORDER — POTASSIUM CHLORIDE CRYS ER 20 MEQ PO TBCR
20.0000 meq | EXTENDED_RELEASE_TABLET | Freq: Once | ORAL | Status: AC
  Administered 2020-03-04: 20 meq via ORAL
  Filled 2020-03-04: qty 1

## 2020-03-04 MED ORDER — IBUPROFEN 400 MG PO TABS
200.0000 mg | ORAL_TABLET | Freq: Once | ORAL | Status: AC
  Administered 2020-03-04: 200 mg via ORAL
  Filled 2020-03-04: qty 1

## 2020-03-04 MED ORDER — SUMATRIPTAN SUCCINATE 50 MG PO TABS
25.0000 mg | ORAL_TABLET | Freq: Once | ORAL | Status: AC
  Administered 2020-03-04: 25 mg via ORAL
  Filled 2020-03-04: qty 1

## 2020-03-04 NOTE — Progress Notes (Signed)
PROGRESS NOTE  Krista Carpenter JOI:[id number] DOB: 09-11-1965 DOA: 03/02/2020 PCP: [name], NP  HPI/Recap of past 24 hours: HPI from Dr Lance Muss is a 54 y.o. female with past medical history of HTN, HLD, depression, prediabetic, diverticulosis, IBS, insomnia, migraine as reviewed from EMR, presented at [facility] ED with complaining of fever, nausea vomiting diarrhea and abdominal pain for the past few days. As per patient she started feeling sick, felt she might be getting UTI, she got Covid test done which was negative. Patient went to primary care physician and she got antibiotics for UTI, patient was having diarrhea and left lower quadrant pain so she thought maybe she is having diverticulitis.  Patient's condition got worse with intractable vomiting, diarrhea. Patient got blood work done and she was informed that her WBC count and LFTs are high so she should go to the ED. In the ED, noted L flank tenderness, elevated WBC count, transaminitis, AKI creatinine 1.37, tachycardic. CT abdomen positive for left-sided pyelonephritis. Patient was given IV antibiotics ceftriaxone and IV fluids.  Admitted for further management.    Today, patient reports abdominal cramping, nausea, diarrhea significantly improved.  Now complains of significant headache, has a history of migraines.  Still feels wiped out.   Assessment/Plan: Principal Problem:   Pyelonephritis of left kidney Active Problems:   Migraine   Adjustment disorder with mixed anxiety and depressed mood   Prediabetes   Hyperlipidemia   Essential hypertension   Insomnia   Sepsis (HCC)   AKI (acute kidney injury) (HCC)   Transaminitis   Severe sepsis 2/2 acute pyelonephritis On admission, tachycardic, febrile, elevated WBC, AKI, transaminitis Currently afebrile, last temp was on 03/03/2020, 100.20F, with no leukocytosis BC x2 NGTD CT abdomen showed left pyelonephritis, without abscess UA showed small leukocytes,  large hemoglobin, negative nitrite, few bacteria, greater than 50 WBC (has received antibiotics prior to the sample), urine culture showed multiple species Continue IV ceftriaxone Continue IV fluids, pain management Monitor closely  AKI Ongoing Likely 2/2 above Continue IV fluids Daily CMP  Transaminitis Ongoing Likely 2/2 sepsis INR WNL, acute hepatitis panel non-reactive If persistent, will consult GI  History of IBS Likely ??IBS flare-continues to have significant diarrhea, bloating (could be related to antibiotics as well) C. difficile, GI panel negative Start Bentyl, PPI, Imodium as needed Advance diet as tolerated  Normal anion gap metabolic acidosis Likely 2/2 GI losses Continue IV fluids Daily CMP  Hypertension BP stable Hold home losartan due to AKI as well as hypotension risk IV hydralazine as needed  Normocytic anemia May be worsened by hemodilution Anemia panel in a.m. No signs of obvious significant bleeding Daily CBC  History of migraine Patient continues to report significant headaches Fioricet as needed, 1 dose of Imitrex given  Depression Continue Prozac  Insomnia Continue trazodone       Malnutrition Type:      Malnutrition Characteristics:      Nutrition Interventions:       Estimated body mass index is 29.29 kg/m as calculated from the following:   Height as of this encounter: 4\' 11"  (1.499 m).   Weight as of this encounter: 65.8 kg.     Code Status: Full  Family Communication: Discussed extensively with patient and husband at bedside on 03/04/2020  Disposition Plan: Status is: Inpatient  Remains inpatient appropriate because:Inpatient level of care appropriate due to severity of illness   Dispo: The patient is from: Home  Anticipated d/c is to: Home              Anticipated d/c date is: 2 days              Patient currently is not medically stable to  d/c.    Consultants:  None  Procedures:  None  Antimicrobials:  Ceftriaxone  DVT prophylaxis: Lovenox   Objective: Vitals:   03/03/20 2302 03/04/20 0529 03/04/20 0548 03/04/20 0926  BP: (!) 154/91 127/79  (!) 158/87  Pulse: 94 96  78  Resp: 19 18  16   Temp: 99.3 F (37.4 C) 99.6 F (37.6 C)  98.2 F (36.8 C)  TempSrc: Oral Oral  Oral  SpO2: 93% (!) 83% 94% 100%  Weight:      Height:        Intake/Output Summary (Last 24 hours) at 03/04/2020 1656 Last data filed at 03/04/2020 1400 Gross per 24 hour  Intake 2006.61 ml  Output --  Net 2006.61 ml   Filed Weights   03/02/20 1229  Weight: 65.8 kg    Exam:  General: NAD  Cardiovascular: S1, S2 present  Respiratory: CTAB  Abdomen: Soft, nontender, nondistended, bowel sounds present  Musculoskeletal: No bilateral pedal edema noted  Skin: Normal  Psychiatry: Normal mood   Data Reviewed: CBC: Recent Labs  Lab 03/01/20 1452 03/02/20 1231 03/03/20 0129 03/04/20 0521  WBC 18.5 Repeated and verified X2.* 11.9* 10.0 8.2  NEUTROABS 15.4*  --   --  5.2  HGB 11.6* 12.2 9.5* 9.3*  HCT 34.2* 36.5 28.9* 28.0*  MCV 88.6 89.2 89.8 88.6  PLT 225.0 223 197 238   Basic Metabolic Panel: Recent Labs  Lab 03/01/20 1452 03/02/20 1231 03/03/20 0129 03/04/20 0521  NA 136 135 135 135  K 3.7 3.7 3.4* 3.4*  CL 102 102 108 108  CO2 23 22 17* 21*  GLUCOSE 81 120* 99 100*  BUN 25* 24* 16 15  CREATININE 1.34* 1.37* 1.04* 1.26*  CALCIUM 8.9 8.9 7.5* 7.6*   GFR: Estimated Creatinine Clearance: 42.6 mL/min (A) (by C-G formula based on SCr of 1.26 mg/dL (H)). Liver Function Tests: Recent Labs  Lab 03/01/20 1452 03/02/20 1231 03/03/20 0129 03/04/20 0521  AST 125* 52* 30 190*  ALT 195* 127* 82* 246*  ALKPHOS 121* 126 110 259*  BILITOT 0.5 0.6 0.7 0.7  PROT 7.2 7.7 6.5 6.2*  ALBUMIN 4.2 3.8 3.1* 2.8*   Recent Labs  Lab 03/02/20 1131 03/02/20 1231  LIPASE 28.0 20   No results for input(s): AMMONIA  in the last 168 hours. Coagulation Profile: Recent Labs  Lab 03/04/20 0833  INR 1.2   Cardiac Enzymes: Recent Labs  Lab 03/02/20 1231  CKTOTAL 34*   BNP (last 3 results) No results for input(s): PROBNP in the last 8760 hours. HbA1C: No results for input(s): HGBA1C in the last 72 hours. CBG: No results for input(s): GLUCAP in the last 168 hours. Lipid Profile: No results for input(s): CHOL, HDL, LDLCALC, TRIG, CHOLHDL, LDLDIRECT in the last 72 hours. Thyroid Function Tests: No results for input(s): TSH, T4TOTAL, FREET4, T3FREE, THYROIDAB in the last 72 hours. Anemia Panel: No results for input(s): VITAMINB12, FOLATE, FERRITIN, TIBC, IRON, RETICCTPCT in the last 72 hours. Urine analysis:    Component Value Date/Time   COLORURINE YELLOW (A) 03/02/2020 1231   APPEARANCEUR HAZY (A) 03/02/2020 1231   LABSPEC 1.014 03/02/2020 1231   PHURINE 6.0 03/02/2020 1231   GLUCOSEU NEGATIVE 03/02/2020 1231   HGBUR LARGE (A)  03/02/2020 1231   BILIRUBINUR NEGATIVE 03/02/2020 1231   BILIRUBINUR neg 03/01/2020 1421   KETONESUR NEGATIVE 03/02/2020 1231   PROTEINUR 100 (A) 03/02/2020 1231   UROBILINOGEN 0.2 03/01/2020 1421   NITRITE NEGATIVE 03/02/2020 1231   LEUKOCYTESUR SMALL (A) 03/02/2020 1231   Sepsis Labs: (procalcitonin:4,lacticidven:4)  ) Recent Results (from the past 240 hour(s))  Urine Culture     Status: None   Collection Time: 03/01/20  2:22 PM   Specimen: Urine  Result Value Ref Range Status   MICRO NUMBER: [id number]  Final   SPECIMEN QUALITY: Adequate  Final   Sample Source NOT GIVEN  Final   STATUS: FINAL  Final   ISOLATE 1:   Final    Growth of mixed flora was isolated, suggesting probable contamination. No further testing will be performed. If clinically indicated, recollection using a method to minimize contamination, with prompt transfer to Urine Culture Transport Tube, is  recommended.   Urine Culture     Status: Abnormal   Collection Time: 03/02/20  12:31 PM   Specimen: Urine, Random  Result Value Ref Range Status   Specimen Description   Final    URINE, RANDOM Performed at [facility], [address] [place], [place] [address]    Special Requests   Final    NONE Performed at [facility], [address] [place], [place] [address]    Culture MULTIPLE SPECIES PRESENT, SUGGEST RECOLLECTION (A)  Final   Report Status 03/03/2020 FINAL  Final  Respiratory Panel by RT PCR (Flu A&B, Covid) - Nasopharyngeal Swab     Status: None   Collection Time: 03/02/20  1:14 PM   Specimen: Nasopharyngeal Swab  Result Value Ref Range Status   SARS Coronavirus 2 by RT PCR NEGATIVE NEGATIVE Final    Comment: (NOTE) SARS-CoV-2 target nucleic acids are NOT DETECTED.  The SARS-CoV-2 RNA is generally detectable in upper respiratoy specimens during the acute phase of infection. The lowest concentration of SARS-CoV-2 viral copies this assay can detect is 131 copies/mL. A negative result does not preclude SARS-Cov-2 infection and should not be used as the sole basis for treatment or other patient management decisions. A negative result may occur with  improper specimen collection/handling, submission of specimen other than nasopharyngeal swab, presence of viral mutation(s) within the areas targeted by this assay, and inadequate number of viral copies (<131 copies/mL). A negative result must be combined with clinical observations, patient history, and epidemiological information. The expected result is Negative.  Fact Sheet for Patients:  https://www.moore.com/  Fact Sheet for Healthcare Providers:  https://www.young.biz/  This test is no t yet approved or cleared by the [place] FDA and  has been authorized for detection and/or diagnosis of SARS-CoV-2 by FDA under an Emergency Use Authorization (EUA). This EUA will remain  in effect (meaning this test can be used) for the duration of  the COVID-19 declaration under Section 564(b)(1) of the Act, 21 U.S.C. section 360bbb-3(b)(1), unless the authorization is terminated or revoked sooner.     Influenza A by PCR NEGATIVE NEGATIVE Final   Influenza B by PCR NEGATIVE NEGATIVE Final    Comment: (NOTE) The Xpert Xpress SARS-CoV-2/FLU/RSV assay is intended as an aid in  the diagnosis of influenza from Nasopharyngeal swab specimens and  should not be used as a sole basis for treatment. Nasal washings and  aspirates are unacceptable for Xpert Xpress SARS-CoV-2/FLU/RSV  testing.  Fact Sheet for Patients: https://www.moore.com/  Fact Sheet for Healthcare Providers: https://www.young.biz/  This test is not yet approved or cleared by the [place] and  has been authorized for detection and/or diagnosis of SARS-CoV-2 by  FDA under an Emergency Use Authorization (EUA). This EUA will remain  in effect (meaning this test can be used) for the duration of the  Covid-19 declaration under Section 564(b)(1) of the Act, 21  U.S.C. section 360bbb-3(b)(1), unless the authorization is  terminated or revoked. Performed at [facility], [address] [place], [place] [address]   Gastrointestinal Panel by PCR , Stool     Status: None   Collection Time: 03/02/20  5:54 PM   Specimen: Stool  Result Value Ref Range Status   Campylobacter species NOT DETECTED NOT DETECTED Final   Plesimonas shigelloides NOT DETECTED NOT DETECTED Final   Salmonella species NOT DETECTED NOT DETECTED Final   Yersinia enterocolitica NOT DETECTED NOT DETECTED Final   Vibrio species NOT DETECTED NOT DETECTED Final   Vibrio cholerae NOT DETECTED NOT DETECTED Final   Enteroaggregative E coli (EAEC) NOT DETECTED NOT DETECTED Final   Enteropathogenic E coli (EPEC) NOT DETECTED NOT DETECTED Final   Enterotoxigenic E coli (ETEC) NOT DETECTED NOT DETECTED Final   Shiga like toxin producing E coli (STEC) NOT  DETECTED NOT DETECTED Final   Shigella/Enteroinvasive E coli (EIEC) NOT DETECTED NOT DETECTED Final   Cryptosporidium NOT DETECTED NOT DETECTED Final   Cyclospora cayetanensis NOT DETECTED NOT DETECTED Final   Entamoeba histolytica NOT DETECTED NOT DETECTED Final   Giardia lamblia NOT DETECTED NOT DETECTED Final   Adenovirus F40/41 NOT DETECTED NOT DETECTED Final   Astrovirus NOT DETECTED NOT DETECTED Final   Norovirus GI/GII NOT DETECTED NOT DETECTED Final   Rotavirus A NOT DETECTED NOT DETECTED Final   Sapovirus (I, II, IV, and V) NOT DETECTED NOT DETECTED Final    Comment: Performed at [facility], [address] [place], [place] [address]  C Difficile Quick Screen w PCR reflex     Status: None   Collection Time: 03/02/20  7:12 PM   Specimen: Stool  Result Value Ref Range Status   C Diff antigen NEGATIVE NEGATIVE Final   C Diff toxin NEGATIVE NEGATIVE Final   C Diff interpretation No C. difficile detected.  Final    Comment: Performed at [facility], [address] [place], [place] [address]  Blood culture (routine x 2)     Status: None (Preliminary result)   Collection Time: 03/02/20  9:50 PM   Specimen: BLOOD  Result Value Ref Range Status   Specimen Description BLOOD RIGHT WRIST  Final   Special Requests   Final    BOTTLES DRAWN AEROBIC AND ANAEROBIC Blood Culture adequate volume   Culture   Final    NO GROWTH 2 DAYS Performed at [facility], [address], [place], [place] [address]    Report Status PENDING  Incomplete  Blood culture (routine x 2)     Status: None (Preliminary result)   Collection Time: 03/02/20  9:50 PM   Specimen: BLOOD  Result Value Ref Range Status   Specimen Description BLOOD LEFT ASSIST CONTROL  Final   Special Requests   Final    BOTTLES DRAWN AEROBIC AND ANAEROBIC Blood Culture adequate volume   Culture   Final    NO GROWTH 2 DAYS Performed at [facility], [address], [place], [place]  [address]    Report Status PENDING  Incomplete      Studies: No results  found.  Scheduled Meds:  dicyclomine  10 mg Oral TID AC   enoxaparin (LOVENOX) injection  40 mg Subcutaneous Q24H   FLUoxetine  40 mg Oral Daily   pantoprazole  40 mg Oral Daily   potassium chloride  20 mEq Oral Once   SUMAtriptan  25 mg Oral Once    Continuous Infusions:  sodium chloride 100 mL/hr at 03/04/20 1438   cefTRIAXone (ROCEPHIN)  IV Stopped (03/04/20 1005)     LOS: 2 days     Briant CedarNkeiruka J Icess Bertoni, MD [facility]  If 7PM-7AM, please contact night-coverage [email or web address] 03/04/2020, 4:56 PM

## 2020-03-04 NOTE — Progress Notes (Signed)
   03/04/20 1923  Assess: MEWS Score  Temp (!) 103.1 F (39.5 C)  Assess: MEWS Score  MEWS Temp 2  MEWS Systolic 0  MEWS Pulse 0  MEWS RR 0  MEWS LOC 0  MEWS Score 2  MEWS Score Color Yellow  Assess: if the MEWS score is Yellow or Red  Were vital signs taken at a resting state? Yes  Focused Assessment No change from prior assessment  Early Detection of Sepsis Score *See Row Information* Low  Treat  MEWS Interventions Administered scheduled meds/treatments  Pain Scale 0-10  Pain Score 3  Pain Type Acute pain  Pain Location Head  Notify: Charge Nurse/RN  Name of Charge Nurse/RN Notified TC RN  Date Charge Nurse/RN Notified 03/04/20  Time Charge Nurse/RN Notified 1930  Notify: Provider  Provider Name/Title Reyes Ivan NP  Date Provider Notified 03/04/20  Time Provider Notified 1925  Notification Type Page  Notification Reason Change in status  Response See new orders  Date of Provider Response 03/04/20  Time of Provider Response 1925

## 2020-03-05 ENCOUNTER — Inpatient Hospital Stay: Payer: 59

## 2020-03-05 LAB — CBC WITH DIFFERENTIAL/PLATELET
Abs Immature Granulocytes: 0.15 10*3/uL — ABNORMAL HIGH (ref 0.00–0.07)
Basophils Absolute: 0.1 10*3/uL (ref 0.0–0.1)
Basophils Relative: 1 %
Eosinophils Absolute: 0.2 10*3/uL (ref 0.0–0.5)
Eosinophils Relative: 2 %
HCT: 30.9 % — ABNORMAL LOW (ref 36.0–46.0)
Hemoglobin: 10.4 g/dL — ABNORMAL LOW (ref 12.0–15.0)
Immature Granulocytes: 1 %
Lymphocytes Relative: 16 %
Lymphs Abs: 1.7 10*3/uL (ref 0.7–4.0)
MCH: 29.5 pg (ref 26.0–34.0)
MCHC: 33.7 g/dL (ref 30.0–36.0)
MCV: 87.8 fL (ref 80.0–100.0)
Monocytes Absolute: 1.3 10*3/uL — ABNORMAL HIGH (ref 0.1–1.0)
Monocytes Relative: 13 %
Neutro Abs: 7 10*3/uL (ref 1.7–7.7)
Neutrophils Relative %: 67 %
Platelets: 279 10*3/uL (ref 150–400)
RBC: 3.52 MIL/uL — ABNORMAL LOW (ref 3.87–5.11)
RDW: 14.3 % (ref 11.5–15.5)
WBC: 10.4 10*3/uL (ref 4.0–10.5)
nRBC: 0 % (ref 0.0–0.2)

## 2020-03-05 LAB — COMPREHENSIVE METABOLIC PANEL
ALT: 169 U/L — ABNORMAL HIGH (ref 0–44)
AST: 62 U/L — ABNORMAL HIGH (ref 15–41)
Albumin: 3.1 g/dL — ABNORMAL LOW (ref 3.5–5.0)
Alkaline Phosphatase: 283 U/L — ABNORMAL HIGH (ref 38–126)
Anion gap: 10 (ref 5–15)
BUN: 12 mg/dL (ref 6–20)
CO2: 21 mmol/L — ABNORMAL LOW (ref 22–32)
Calcium: 8.3 mg/dL — ABNORMAL LOW (ref 8.9–10.3)
Chloride: 111 mmol/L (ref 98–111)
Creatinine, Ser: 1.18 mg/dL — ABNORMAL HIGH (ref 0.44–1.00)
GFR, Estimated: 55 mL/min — ABNORMAL LOW (ref >60)
Glucose, Bld: 99 mg/dL (ref 70–99)
Potassium: 3.5 mmol/L (ref 3.5–5.1)
Sodium: 142 mmol/L (ref 135–145)
Total Bilirubin: 0.6 mg/dL (ref 0.3–1.2)
Total Protein: 7.1 g/dL (ref 6.5–8.1)

## 2020-03-05 LAB — URINALYSIS, ROUTINE W REFLEX MICROSCOPIC
Bacteria, UA: NONE SEEN
Bilirubin Urine: NEGATIVE
Glucose, UA: NEGATIVE mg/dL
Ketones, ur: NEGATIVE mg/dL
Leukocytes,Ua: NEGATIVE
Nitrite: NEGATIVE
Protein, ur: NEGATIVE mg/dL
Specific Gravity, Urine: 1.003 — ABNORMAL LOW (ref 1.005–1.030)
pH: 6 (ref 5.0–8.0)

## 2020-03-05 LAB — IRON AND TIBC
Iron: 31 ug/dL (ref 28–170)
Saturation Ratios: 13 % (ref 10.4–31.8)
TIBC: 248 ug/dL — ABNORMAL LOW (ref 250–450)
UIBC: 217 ug/dL

## 2020-03-05 LAB — FERRITIN: Ferritin: 499 ng/mL — ABNORMAL HIGH (ref 11–307)

## 2020-03-05 LAB — FOLATE: Folate: 18.9 ng/mL (ref >5.9)

## 2020-03-05 LAB — VITAMIN B12: Vitamin B-12: 760 pg/mL (ref 180–914)

## 2020-03-05 MED ORDER — SALINE SPRAY 0.65 % NA SOLN
1.0000 | Freq: Two times a day (BID) | NASAL | Status: DC
  Administered 2020-03-05 – 2020-03-06 (×3): 1 via NASAL
  Filled 2020-03-05: qty 44

## 2020-03-05 MED ORDER — SODIUM CHLORIDE 0.9 % IV SOLN
INTRAVENOUS | Status: DC | PRN
  Administered 2020-03-05 – 2020-03-06 (×2): 250 mL via INTRAVENOUS

## 2020-03-05 MED ORDER — SODIUM CHLORIDE 0.9 % IV SOLN
500.0000 mg | INTRAVENOUS | Status: DC
  Administered 2020-03-05: 500 mg via INTRAVENOUS
  Filled 2020-03-05 (×2): qty 500

## 2020-03-05 MED ORDER — SODIUM CHLORIDE 0.9 % IV SOLN
2.0000 g | Freq: Two times a day (BID) | INTRAVENOUS | Status: DC
  Administered 2020-03-05 – 2020-03-07 (×4): 2 g via INTRAVENOUS
  Filled 2020-03-05 (×5): qty 2

## 2020-03-05 MED ORDER — OXYCODONE HCL 5 MG PO TABS: 5.0000 mg | ORAL_TABLET | Freq: Three times a day (TID) | ORAL | Status: DC | PRN

## 2020-03-05 NOTE — Progress Notes (Addendum)
PROGRESS NOTE  Krista Carpenter ESP:[id number] DOB: 1966/02/22 DOA: 03/02/2020 PCP: [name], NP  HPI/Recap of past 24 hours: HPI from Dr Lance Muss is a 54 y.o. female with past medical history of HTN, HLD, depression, prediabetic, diverticulosis, IBS, insomnia, migraine as reviewed from EMR, presented at [facility] ED with complaining of fever, nausea vomiting diarrhea and abdominal pain for the past few days. As per patient she started feeling sick, felt she might be getting UTI, she got Covid test done which was negative. Patient went to primary care physician and she got antibiotics for UTI, patient was having diarrhea and left lower quadrant pain so she thought maybe she is having diverticulitis.  Patient's condition got worse with intractable vomiting, diarrhea. Patient got blood work done and she was informed that her WBC count and LFTs are high so she should go to the ED. In the ED, noted L flank tenderness, elevated WBC count, transaminitis, AKI creatinine 1.37, tachycardic. CT abdomen positive for left-sided pyelonephritis. Patient was given IV antibiotics ceftriaxone and IV fluids.  Admitted for further management.    Overnight, patient noted to spike temp, feels SOB on ambulation, with nonproductive cough which seems to be new, headache improving, left flank pain improving.  Denies any further abdominal pain, nausea/vomiting, diarrhea.  Poor appetite   Assessment/Plan: Principal Problem:   Pyelonephritis of left kidney Active Problems:   Migraine   Adjustment disorder with mixed anxiety and depressed mood   Prediabetes   Hyperlipidemia   Essential hypertension   Insomnia   Sepsis (HCC)   AKI (acute kidney injury) (HCC)   Transaminitis   Severe sepsis 2/2 acute pyelonephritis On admission, tachycardic, febrile, elevated WBC, AKI, transaminitis Currently afebrile, last temp was on 03/04/2020, 103.99F, with no leukocytosis BC x2 NGTD, repeat BC X 2  pending CT abdomen showed left pyelonephritis, without abscess UA showed small leukocytes, large hemoglobin, negative nitrite, few bacteria, greater than 50 WBC (has received antibiotics prior to the sample), urine culture showed multiple species, repeat pending Due to persistent high fever, will switch to broader spectrum AB, cefepime, and addon ceftriaxone, d/c IV ceftriaxone D/C IV fluids, continue pain management Monitor closely  Acute hypoxic respiratory failure CAP Noted to desaturate on room air upon ambulation, new nonproductive cough Noted with persistent fever Chest x-ray with right lower lobe pneumonia Switch to cefepime, add azithromycin Incentive spirometry Supplemental oxygen as needed, encouraged to ambulate  AKI Resolving Likely 2/2 above D/C IV fluids, encourage oral hydration Daily CMP  Transaminitis Ongoing Likely 2/2 sepsis INR WNL, acute hepatitis panel non-reactive If worsening, will consult GI  History of IBS Likely ??IBS flare, with now resolved diarrhea C. difficile, GI panel negative Continue Bentyl, PPI, Imodium as needed Advance diet as tolerated  Normal anion gap metabolic acidosis Likely 2/2 GI losses Encourage oral intake Daily CMP  Hypertension BP stable Hold home losartan due to AKI as well as hypotension risk IV hydralazine as needed  Normocytic anemia May be worsened by hemodilution Anemia panel showed iron 31, sats 13 No signs of obvious significant bleeding Daily CBC  History of migraine Fioricet as needed, 1 dose of Imitrex given  Depression Continue Prozac  Insomnia Continue trazodone       Malnutrition Type:      Malnutrition Characteristics:      Nutrition Interventions:       Estimated body mass index is 29.29 kg/m as calculated from the following:   Height as of this encounter: 4'  11" (1.499 m).   Weight as of this encounter: 65.8 kg.     Code Status: Full  Family Communication: Discussed  extensively with patient and husband at bedside on 03/05/2020  Disposition Plan: Status is: Inpatient  Remains inpatient appropriate because:Inpatient level of care appropriate due to severity of illness   Dispo: The patient is from: Home              Anticipated d/c is to: Home              Anticipated d/c date is: 2 days              Patient currently is not medically stable to d/c.    Consultants:  None  Procedures:  None  Antimicrobials:  Cefepime  Azithromycin  DVT prophylaxis: Lovenox   Objective: Vitals:   03/04/20 2139 03/05/20 0400 03/05/20 0835 03/05/20 1230  BP: 137/74 (!) 168/97 (!) 159/84 (!) 156/77  Pulse: 94  80 79  Resp: 20 20 18 18   Temp: 99.1 F (37.3 C) 98.7 F (37.1 C) 98.4 F (36.9 C) 98.6 F (37 C)  TempSrc: Oral  Oral Oral  SpO2: 92% 97% 97% 93%  Weight:      Height:        Intake/Output Summary (Last 24 hours) at 03/05/2020 1508 Last data filed at 03/05/2020 1025 Gross per 24 hour  Intake 0 ml  Output 400 ml  Net -400 ml   Filed Weights   03/02/20 1229  Weight: 65.8 kg    Exam:  General: NAD  Cardiovascular: S1, S2 present  Respiratory:  Diminished breath sounds bilaterally  Abdomen: Soft, nontender, nondistended, bowel sounds present  Musculoskeletal: No bilateral pedal edema noted  Skin: Normal  Psychiatry: Normal mood   Data Reviewed: CBC: Recent Labs  Lab 03/01/20 1452 03/02/20 1231 03/03/20 0129 03/04/20 0521 03/05/20 0436  WBC 18.5 Repeated and verified X2.* 11.9* 10.0 8.2 10.4  NEUTROABS 15.4*  --   --  5.2 7.0  HGB 11.6* 12.2 9.5* 9.3* 10.4*  HCT 34.2* 36.5 28.9* 28.0* 30.9*  MCV 88.6 89.2 89.8 88.6 87.8  PLT 225.0 223 197 238 279   Basic Metabolic Panel: Recent Labs  Lab 03/01/20 1452 03/02/20 1231 03/03/20 0129 03/04/20 0521 03/05/20 0436  NA 136 135 135 135 142  K 3.7 3.7 3.4* 3.4* 3.5  CL 102 102 108 108 111  CO2 23 22 17* 21* 21*  GLUCOSE 81 120* 99 100* 99  BUN 25* 24* 16  15 12   CREATININE 1.34* 1.37* 1.04* 1.26* 1.18*  CALCIUM 8.9 8.9 7.5* 7.6* 8.3*   GFR: Estimated Creatinine Clearance: 45.4 mL/min (A) (by C-G formula based on SCr of 1.18 mg/dL (H)). Liver Function Tests: Recent Labs  Lab 03/01/20 1452 03/02/20 1231 03/03/20 0129 03/04/20 0521 03/05/20 0436  AST 125* 52* 30 190* 62*  ALT 195* 127* 82* 246* 169*  ALKPHOS 121* 126 110 259* 283*  BILITOT 0.5 0.6 0.7 0.7 0.6  PROT 7.2 7.7 6.5 6.2* 7.1  ALBUMIN 4.2 3.8 3.1* 2.8* 3.1*   Recent Labs  Lab 03/02/20 1131 03/02/20 1231  LIPASE 28.0 20   No results for input(s): AMMONIA in the last 168 hours. Coagulation Profile: Recent Labs  Lab 03/04/20 0833  INR 1.2   Cardiac Enzymes: Recent Labs  Lab 03/02/20 1231  CKTOTAL 34*   BNP (last 3 results) No results for input(s): PROBNP in the last 8760 hours. HbA1C: No results for input(s): HGBA1C in the last  72 hours. CBG: No results for input(s): GLUCAP in the last 168 hours. Lipid Profile: No results for input(s): CHOL, HDL, LDLCALC, TRIG, CHOLHDL, LDLDIRECT in the last 72 hours. Thyroid Function Tests: No results for input(s): TSH, T4TOTAL, FREET4, T3FREE, THYROIDAB in the last 72 hours. Anemia Panel: Recent Labs    03/05/20 0436  VITAMINB12 760  FOLATE 18.9  FERRITIN 499*  TIBC 248*  IRON 31   Urine analysis:    Component Value Date/Time   COLORURINE STRAW (A) 03/05/2020 1038   APPEARANCEUR CLEAR (A) 03/05/2020 1038   LABSPEC 1.003 (L) 03/05/2020 1038   PHURINE 6.0 03/05/2020 1038   GLUCOSEU NEGATIVE 03/05/2020 1038   HGBUR MODERATE (A) 03/05/2020 1038   BILIRUBINUR NEGATIVE 03/05/2020 1038   BILIRUBINUR neg 03/01/2020 1421   KETONESUR NEGATIVE 03/05/2020 1038   PROTEINUR NEGATIVE 03/05/2020 1038   UROBILINOGEN 0.2 03/01/2020 1421   NITRITE NEGATIVE 03/05/2020 1038   LEUKOCYTESUR NEGATIVE 03/05/2020 1038   Sepsis Labs: @LABRCNTIP (procalcitonin:4,lacticidven:4)  ) Recent Results (from the past 240 hour(s))    Urine Culture     Status: None   Collection Time: 03/01/20  2:22 PM   Specimen: Urine  Result Value Ref Range Status   MICRO NUMBER: [id number]  Final   SPECIMEN QUALITY: Adequate  Final   Sample Source NOT GIVEN  Final   STATUS: FINAL  Final   ISOLATE 1:   Final    Growth of mixed flora was isolated, suggesting probable contamination. No further testing will be performed. If clinically indicated, recollection using a method to minimize contamination, with prompt transfer to Urine Culture Transport Tube, is  recommended.   Urine Culture     Status: Abnormal   Collection Time: 03/02/20 12:31 PM   Specimen: Urine, Random  Result Value Ref Range Status   Specimen Description   Final    URINE, RANDOM Performed at [facility], [address] [place], [place] [address]    Special Requests   Final    NONE Performed at [facility], [address] [place], [place] [address]    Culture MULTIPLE SPECIES PRESENT, SUGGEST RECOLLECTION (A)  Final   Report Status 03/03/2020 FINAL  Final  Respiratory Panel by RT PCR (Flu A&B, Covid) - Nasopharyngeal Swab     Status: None   Collection Time: 03/02/20  1:14 PM   Specimen: Nasopharyngeal Swab  Result Value Ref Range Status   SARS Coronavirus 2 by RT PCR NEGATIVE NEGATIVE Final    Comment: (NOTE) SARS-CoV-2 target nucleic acids are NOT DETECTED.  The SARS-CoV-2 RNA is generally detectable in upper respiratoy specimens during the acute phase of infection. The lowest concentration of SARS-CoV-2 viral copies this assay can detect is 131 copies/mL. A negative result does not preclude SARS-Cov-2 infection and should not be used as the sole basis for treatment or other patient management decisions. A negative result may occur with  improper specimen collection/handling, submission of specimen other than nasopharyngeal swab, presence of viral mutation(s) within the areas targeted by this assay, and inadequate number of viral  copies (<131 copies/mL). A negative result must be combined with clinical observations, patient history, and epidemiological information. The expected result is Negative.  Fact Sheet for Patients:  https://www.moore.com/https://www.fda.gov/media/142436/download  Fact Sheet for Healthcare Providers:  https://www.young.biz/https://www.fda.gov/media/142435/download  This test is no t yet approved or cleared by the [place] FDA and  has been authorized for detection and/or diagnosis of SARS-CoV-2 by FDA under an Emergency Use Authorization (EUA). This EUA will remain  in effect (meaning this test can be used) for the duration of the COVID-19 declaration under Section 564(b)(1) of the Act, 21 U.S.C. section 360bbb-3(b)(1), unless the authorization is terminated or revoked sooner.     Influenza A by PCR NEGATIVE NEGATIVE Final   Influenza B by PCR NEGATIVE NEGATIVE Final    Comment: (NOTE) The Xpert Xpress SARS-CoV-2/FLU/RSV assay is intended as an aid in  the diagnosis of influenza from Nasopharyngeal swab specimens and  should not be used as a sole basis for treatment. Nasal washings and  aspirates are unacceptable for Xpert Xpress SARS-CoV-2/FLU/RSV  testing.  Fact Sheet for Patients: https://www.moore.com/  Fact Sheet for Healthcare Providers: https://www.young.biz/  This test is not yet approved or cleared by the [place] FDA and  has been authorized for detection and/or diagnosis of SARS-CoV-2 by  FDA under an Emergency Use Authorization (EUA). This EUA will remain  in effect (meaning this test can be used) for the duration of the  Covid-19 declaration under Section 564(b)(1) of the Act, 21  U.S.C. section 360bbb-3(b)(1), unless the authorization is  terminated or revoked. Performed at [facility], [address] [place], [place] [address]   Gastrointestinal Panel by PCR , Stool     Status: None   Collection Time: 03/02/20  5:54 PM   Specimen: Stool    Result Value Ref Range Status   Campylobacter species NOT DETECTED NOT DETECTED Final   Plesimonas shigelloides NOT DETECTED NOT DETECTED Final   Salmonella species NOT DETECTED NOT DETECTED Final   Yersinia enterocolitica NOT DETECTED NOT DETECTED Final   Vibrio species NOT DETECTED NOT DETECTED Final   Vibrio cholerae NOT DETECTED NOT DETECTED Final   Enteroaggregative E coli (EAEC) NOT DETECTED NOT DETECTED Final   Enteropathogenic E coli (EPEC) NOT DETECTED NOT DETECTED Final   Enterotoxigenic E coli (ETEC) NOT DETECTED NOT DETECTED Final   Shiga like toxin producing E coli (STEC) NOT DETECTED NOT DETECTED Final   Shigella/Enteroinvasive E coli (EIEC) NOT DETECTED NOT DETECTED Final   Cryptosporidium NOT DETECTED NOT DETECTED Final   Cyclospora cayetanensis NOT DETECTED NOT DETECTED Final   Entamoeba histolytica NOT DETECTED NOT DETECTED Final   Giardia lamblia NOT DETECTED NOT DETECTED Final   Adenovirus F40/41 NOT DETECTED NOT DETECTED Final   Astrovirus NOT DETECTED NOT DETECTED Final   Norovirus GI/GII NOT DETECTED NOT DETECTED Final   Rotavirus A NOT DETECTED NOT DETECTED Final   Sapovirus (I, II, IV, and V) NOT DETECTED NOT DETECTED Final    Comment: Performed at [facility], [address] [place], [place] [address]  C Difficile Quick Screen w PCR reflex     Status: None   Collection Time: 03/02/20  7:12 PM   Specimen: Stool  Result Value Ref Range Status   C Diff antigen NEGATIVE NEGATIVE Final   C Diff toxin NEGATIVE NEGATIVE Final   C Diff interpretation No C. difficile detected.  Final    Comment: Performed at [facility], [address] [place], [place] [address]  Blood culture (routine x 2)     Status: None (Preliminary result)   Collection Time: 03/02/20  9:50 PM   Specimen: BLOOD  Result Value Ref Range Status   Specimen Description BLOOD RIGHT WRIST  Final   Special Requests   Final    BOTTLES DRAWN AEROBIC AND ANAEROBIC Blood Culture  adequate volume   Culture   Final    NO GROWTH 3 DAYS Performed at [facility], [address], [place], [place] [address]    Report Status PENDING  Incomplete  Blood culture (routine x 2)     Status: None (Preliminary result)   Collection Time: 03/02/20  9:50 PM   Specimen: BLOOD  Result Value Ref Range Status   Specimen Description BLOOD LEFT ASSIST CONTROL  Final   Special Requests   Final    BOTTLES DRAWN AEROBIC AND ANAEROBIC Blood Culture adequate volume   Culture   Final    NO GROWTH 3 DAYS Performed at [facility], [address], [place], [place] [address]    Report Status PENDING  Incomplete      Studies: DG Chest Port 1 View  Result Date: 03/05/2020 CLINICAL DATA:  Fever and shortness of breath EXAM: PORTABLE CHEST 1 VIEW COMPARISON:  March 02, 2020 FINDINGS: There is airspace opacity in the right lower lung region. There is mild left base atelectasis. Lungs elsewhere are clear. The heart size and pulmonary vascularity are normal. No adenopathy. No bone lesions. IMPRESSION: Airspace opacity consistent with pneumonia right lower lung region. Slight left base atelectasis. Lungs elsewhere clear. Heart size normal. Electronically Signed   By: Bretta Bang III M.D.   On: 03/05/2020 10:33    Scheduled Meds: . dicyclomine  10 mg Oral TID AC  . enoxaparin (LOVENOX) injection  40 mg Subcutaneous Q24H  . FLUoxetine  40 mg Oral Daily  . pantoprazole  40 mg Oral Daily  . sodium chloride  1 spray Each Nare Q12H    Continuous Infusions: . azithromycin       LOS: 3 days     Briant Cedar, MD [facility]  If 7PM-7AM, please contact night-coverage [email or web address] 03/05/2020, 3:08 PM

## 2020-03-05 NOTE — Consult Note (Signed)
Pharmacy Antibiotic Note  Krista Carpenter is a 54 y.o. female admitted on 03/02/2020 with "Possible HCAP Vs CAP, pyelonephritis".  Pharmacy has been consulted for Cefepime dosing.  Plan: Cefepime 2g Q12H   Height: 4\' 11"  (149.9 cm) Weight: 65.8 kg (145 lb) IBW/kg (Calculated) : 43.2  Temp (24hrs), Avg:100.1 F (37.8 C), Min:98.4 F (36.9 C), Max:103.1 F (39.5 C)  Recent Labs  Lab 03/01/20 1452 03/02/20 1231 03/03/20 0129 03/04/20 0521 03/05/20 0436  WBC 18.5 Repeated and verified X2.* 11.9* 10.0 8.2 10.4  CREATININE 1.34* 1.37* 1.04* 1.26* 1.18*  LATICACIDVEN  --   --  0.9  --   --     Estimated Creatinine Clearance: 45.4 mL/min (A) (by C-G formula based on SCr of 1.18 mg/dL (H)).    No Known Allergies  Antimicrobials this admission: 11/10 Ceftriaxone >> 11/13 11/13  Cefepime >>>   Dose adjustments this admission:   Microbiology results: BCx:  UCx:  Sputum:   MRSA PCR:   Thank you for allowing pharmacy to be a part of this patient's care.  12/13 03/05/2020 3:12 PM

## 2020-03-05 NOTE — Progress Notes (Signed)
Mobility Specialist - Progress Note   03/05/20 1100  Mobility  Activity Ambulated in hall  Level of Assistance Independent  Assistive Device None  Distance Ambulated (ft) 220 ft  Mobility Response Tolerated well  Mobility performed by Mobility specialist  $Mobility charge 1 Mobility    Pre-mobility: 102 HR, 91% SpO2 During mobility: 111 HR, 88% SpO2 Post-mobility: 108 HR, 92% SpO2   Pt was sitting in bed upon arrival utilizing room air. Pt denied any pain or nausea at this time, but stated she was a little tired. Pt is independent in all transfers this date, including ambulation. Pt ambulated to bathroom prior to activity, then progressed to ambulation in the hall. Pt ambulated 220' at steady pace with no AD. No LOB noted. Pt denied weakness or dizziness. Pt ambulated ~150' before c/o slight SOB. O2 desat to 88%, however, no heavy breathing noted as pt is able to carry out conversation throughout activity. Mobility tech went over PLB exercises with pt, which was utilized throughout remainder of session. Upon return to room, pt c/o feeling a little winded and rated her RPE this session a 2/10. Overall, pt tolerated session well. Pt was left in bed with all needs in reach.    Filiberto Pinks Mobility Specialist 03/05/20, 12:06 PM

## 2020-03-06 LAB — COMPREHENSIVE METABOLIC PANEL
ALT: 117 U/L — ABNORMAL HIGH (ref 0–44)
AST: 41 U/L (ref 15–41)
Albumin: 3.1 g/dL — ABNORMAL LOW (ref 3.5–5.0)
Alkaline Phosphatase: 234 U/L — ABNORMAL HIGH (ref 38–126)
Anion gap: 13 (ref 5–15)
BUN: 13 mg/dL (ref 6–20)
CO2: 24 mmol/L (ref 22–32)
Calcium: 8.5 mg/dL — ABNORMAL LOW (ref 8.9–10.3)
Chloride: 104 mmol/L (ref 98–111)
Creatinine, Ser: 1.09 mg/dL — ABNORMAL HIGH (ref 0.44–1.00)
GFR, Estimated: >60 mL/min (ref >60)
Glucose, Bld: 96 mg/dL (ref 70–99)
Potassium: 3.1 mmol/L — ABNORMAL LOW (ref 3.5–5.1)
Sodium: 141 mmol/L (ref 135–145)
Total Bilirubin: 0.6 mg/dL (ref 0.3–1.2)
Total Protein: 7.1 g/dL (ref 6.5–8.1)

## 2020-03-06 LAB — CBC WITH DIFFERENTIAL/PLATELET
Abs Immature Granulocytes: 0.19 10*3/uL — ABNORMAL HIGH (ref 0.00–0.07)
Basophils Absolute: 0.1 10*3/uL (ref 0.0–0.1)
Basophils Relative: 1 %
Eosinophils Absolute: 0.3 10*3/uL (ref 0.0–0.5)
Eosinophils Relative: 3 %
HCT: 28.6 % — ABNORMAL LOW (ref 36.0–46.0)
Hemoglobin: 9.3 g/dL — ABNORMAL LOW (ref 12.0–15.0)
Immature Granulocytes: 2 %
Lymphocytes Relative: 20 %
Lymphs Abs: 1.9 10*3/uL (ref 0.7–4.0)
MCH: 29 pg (ref 26.0–34.0)
MCHC: 32.5 g/dL (ref 30.0–36.0)
MCV: 89.1 fL (ref 80.0–100.0)
Monocytes Absolute: 1.1 10*3/uL — ABNORMAL HIGH (ref 0.1–1.0)
Monocytes Relative: 11 %
Neutro Abs: 6 10*3/uL (ref 1.7–7.7)
Neutrophils Relative %: 63 %
Platelets: 320 10*3/uL (ref 150–400)
RBC: 3.21 MIL/uL — ABNORMAL LOW (ref 3.87–5.11)
RDW: 14.3 % (ref 11.5–15.5)
WBC: 9.5 10*3/uL (ref 4.0–10.5)
nRBC: 0 % (ref 0.0–0.2)

## 2020-03-06 LAB — URINE CULTURE: Culture: NO GROWTH

## 2020-03-06 LAB — MAGNESIUM: Magnesium: 2.5 mg/dL — ABNORMAL HIGH (ref 1.7–2.4)

## 2020-03-06 MED ORDER — PANTOPRAZOLE SODIUM 40 MG PO TBEC: 40.0000 mg | DELAYED_RELEASE_TABLET | Freq: Every day | ORAL | 0 refills | Status: DC

## 2020-03-06 MED ORDER — TRAZODONE HCL 50 MG PO TABS
75.0000 mg | ORAL_TABLET | Freq: Every evening | ORAL | Status: DC | PRN
  Administered 2020-03-06: 50 mg via ORAL
  Filled 2020-03-06: qty 2

## 2020-03-06 MED ORDER — CEFDINIR 300 MG PO CAPS: 300.0000 mg | ORAL_CAPSULE | Freq: Two times a day (BID) | ORAL | 0 refills | Status: AC

## 2020-03-06 MED ORDER — LOSARTAN POTASSIUM 50 MG PO TABS
50.0000 mg | ORAL_TABLET | Freq: Every day | ORAL | Status: DC
  Administered 2020-03-06 – 2020-03-07 (×2): 50 mg via ORAL
  Filled 2020-03-06 (×2): qty 1

## 2020-03-06 MED ORDER — TRAZODONE HCL 50 MG PO TABS
50.0000 mg | ORAL_TABLET | Freq: Every day | ORAL | Status: DC
  Administered 2020-03-06: 50 mg via ORAL
  Filled 2020-03-06: qty 1

## 2020-03-06 MED ORDER — SALINE SPRAY 0.65 % NA SOLN
1.0000 | NASAL | Status: DC | PRN
  Administered 2020-03-07: 1 via NASAL
  Filled 2020-03-06: qty 44

## 2020-03-06 MED ORDER — SUMATRIPTAN SUCCINATE 50 MG PO TABS
25.0000 mg | ORAL_TABLET | ORAL | Status: DC | PRN
  Administered 2020-03-07: 25 mg via ORAL
  Filled 2020-03-06 (×2): qty 1

## 2020-03-06 MED ORDER — POTASSIUM CHLORIDE CRYS ER 20 MEQ PO TBCR
40.0000 meq | EXTENDED_RELEASE_TABLET | Freq: Once | ORAL | Status: AC
  Administered 2020-03-06: 40 meq via ORAL
  Filled 2020-03-06: qty 2

## 2020-03-06 MED ORDER — SUMATRIPTAN SUCCINATE 25 MG PO TABS: 25.0000 mg | ORAL_TABLET | ORAL | 0 refills | Status: DC | PRN

## 2020-03-06 MED ORDER — DICYCLOMINE HCL 10 MG PO CAPS: 10.0000 mg | ORAL_CAPSULE | Freq: Three times a day (TID) | ORAL | 0 refills | Status: DC | PRN

## 2020-03-06 MED ORDER — AZITHROMYCIN 250 MG PO TABS
500.0000 mg | ORAL_TABLET | Freq: Every day | ORAL | Status: DC
  Administered 2020-03-06 – 2020-03-07 (×2): 500 mg via ORAL
  Filled 2020-03-06 (×2): qty 2

## 2020-03-06 MED ORDER — AZITHROMYCIN 500 MG PO TABS: 500.0000 mg | ORAL_TABLET | Freq: Every day | ORAL | 0 refills | Status: AC

## 2020-03-06 NOTE — Discharge Summary (Signed)
Discharge Summary  CASIA CORTI FGH:[id number] DOB: October 22, 1965  PCP: [name], NP   Admit date: 03/02/2020 Discharge date: 03/07/2020  Time spent: 40 mins  Recommendations for Outpatient Follow-up:  PCP in 1 week to follow-up    Discharge Diagnoses:  Active Hospital Problems   Diagnosis Date Noted  . Pyelonephritis of left kidney 03/02/2020  . Sepsis (HCC) 03/02/2020  . AKI (acute kidney injury) (HCC) 03/02/2020  . Transaminitis 03/02/2020  . Insomnia 06/12/2019  . Essential hypertension 05/08/2019  . Prediabetes 05/20/2018  . Hyperlipidemia 05/20/2018  . Adjustment disorder with mixed anxiety and depressed mood 05/31/2015  . Migraine 05/08/2011    Resolved Hospital Problems  No resolved problems to display.    Discharge Condition: Stable  Diet recommendation: As tolerated  Vitals:   03/06/20 2024 03/07/20 0751  BP: (!) 142/75 (!) 152/83  Pulse: 82 84  Resp: 16 20  Temp: 98.2 F (36.8 C)   SpO2: 97% 98%    History of present illness:  PAISYN GUERCIO a 54 y.o.femalewith past medical history ofHTN, HLD, depression, prediabetic, diverticulosis, IBS, insomnia, migraine as reviewed from EMR, presented at [facility] ED with complaining of fever, nausea vomiting diarrhea and abdominal pain for the past few days. As per patient she started feeling sick, felt she might be getting UTI, she got Covid test done which was negative. Patient went to primary care physician and she got antibiotics for UTI, patient was having diarrhea and left lower quadrant pain so she thought maybe she is having diverticulitis. Patient's condition got worse with intractable vomiting, diarrhea. Patient got blood work done and she was informed that her WBC count and LFTs are high so she should go to the ED. In the ED, noted L flank tenderness, elevated WBC count, transaminitis, AKI creatinine 1.37, tachycardic. CT abdomen positive for left-sided pyelonephritis. Patient was given IV  antibiotics ceftriaxone and IV fluids. Admitted for further management.    Today, patient denies any new complaints, stable for discharge to follow-up with PCP, OB/GYN as scheduled.   Hospital Course:  Principal Problem:   Pyelonephritis of left kidney Active Problems:   Migraine   Adjustment disorder with mixed anxiety and depressed mood   Prediabetes   Hyperlipidemia   Essential hypertension   Insomnia   Sepsis (HCC)   AKI (acute kidney injury) (HCC)   Transaminitis   Severe sepsis 2/2 acute pyelonephritis On admission, tachycardic, febrile, elevated WBC, AKI, transaminitis Currently afebrile, last temp was on 03/04/2020, 103.20F, with no leukocytosis BC x2 NGTD, repeat NGTD CT abdomen showed left pyelonephritis, without abscess UA showed small leukocytes, large hemoglobin, negative nitrite, few bacteria, greater than 50 WBC (has received antibiotics prior to the sample), urine culture showed multiple species, repeat no growth as expected S/P ceftriaxone and cefepime---> switch to p.o. cefdinir to complete 7 days of antibiotics Follow-up with PCP  Acute hypoxic respiratory failure ?CAP Noted to desaturate on room air upon ambulation, new nonproductive cough, with fever Currently saturating well on room air Chest x-ray with right lower lobe pneumonia D/C on cefdinir and azithromycin-to complete 7 days of antibiotics Incentive spirometry Follow-up with PCP  AKI Improved Likely 2/2 above D/C IV fluids, encourage oral hydration Follow-up with PCP  Transaminitis Improving Likely 2/2 sepsis INR WNL, acute hepatitis panel non-reactive Follow-up with PCP  Hypokalemia Replaced as needed  History of IBS Likely ??IBS flare, with now resolved diarrhea C. difficile, GI panel negative Discharge on Bentyl prn , PPI Follow-up with PCP  Normal  anion gap metabolic acidosis Resolved Likely 2/2 GI losses  Hypertension Resume home losartan  Normocytic  anemia May be worsened by hemodilution Anemia panel showed iron 31, sats 13 No signs of obvious significant bleeding Follow-up with PCP  History of migraine Imitrex prn  Depression Continue Prozac  Insomnia Continue trazodone         Malnutrition Type:      Malnutrition Characteristics:      Nutrition Interventions:      Estimated body mass index is 29.29 kg/m as calculated from the following:   Height as of this encounter:  (1.499 m).   Weight as of this encounter: 65.8 kg.    Procedures:  None  Consultations:  None  Discharge Exam: BP (!) 152/83 (BP Location: Left Arm)   Pulse 84   Temp 98.2 F (36.8 C) (Oral)   Resp 20   Ht  (1.499 m)   Wt 65.8 kg   LMP 12/02/2012   SpO2 98%   BMI 29.29 kg/m   General: NAD Cardiovascular: S1, S2 present Respiratory: CTA B    Discharge Instructions You were cared for by a hospitalist during your hospital stay. If you have any questions about your discharge medications or the care you received while you were in the hospital after you are discharged, you can call the unit and asked to speak with the hospitalist on call if the hospitalist that took care of you is not available. Once you are discharged, your primary care physician will handle any further medical issues. Please note that NO REFILLS for any discharge medications will be authorized once you are discharged, as it is imperative that you return to your primary care physician (or establish a relationship with a primary care physician if you do not have one) for your aftercare needs so that they can reassess your need for medications and monitor your lab values.  Discharge Instructions    Diet - low sodium heart healthy   Complete by: As directed    Increase activity slowly   Complete by: As directed      Allergies as of 03/07/2020   No Known Allergies     Medication List    STOP taking these medications   ciprofloxacin 500 MG  tablet Commonly known as: Cipro   metroNIDAZOLE 500 MG tablet Commonly known as: FLAGYL     TAKE these medications   azithromycin 500 MG tablet Commonly known as: Zithromax Take 1 tablet (500 mg total) by mouth daily for 3 days. Take 1 tablet daily for 3 days.   cefdinir 300 MG capsule Commonly known as: OMNICEF Take 1 capsule (300 mg total) by mouth 2 (two) times daily for 3 days.   celecoxib 200 MG capsule Commonly known as: CELEBREX TAKE 1 CAPSULE BY MOUTH TWICE A DAY   CombiPatch 0.05-0.14 MG/DAY Generic drug: estradiol-norethindrone Place 1 patch onto the skin 2 (two) times a week.   dicyclomine 10 MG capsule Commonly known as: BENTYL Take 1 capsule (10 mg total) by mouth 3 (three) times daily between meals as needed for up to 10 days for spasms.   FLUoxetine 40 MG capsule Commonly known as: PROZAC TAKE 1 CAPSULE BY MOUTH DAILY FOR ANXIETY AND DEPRESSION   losartan 100 MG tablet Commonly known as: COZAAR Take 1 tablet (100 mg total) by mouth daily. For blood pressure.   pantoprazole 40 MG tablet Commonly known as: PROTONIX Take 1 tablet (40 mg total) by mouth daily.   Premarin vaginal  cream Generic drug: conjugated estrogens Insert 0.5 applicatorful every day by vaginal route for two weeks then to twice a week   SUMAtriptan 25 MG tablet Commonly known as: IMITREX Take 1 tablet (25 mg total) by mouth every 4 (four) hours as needed for migraine or headache. May repeat in 2 hours if headache persists or recurs.   traZODone 50 MG tablet Commonly known as: DESYREL TAKE 1.5 TABLETS (75 MG TOTAL) BY MOUTH AT BEDTIME AS NEEDED FOR SLEEP.      No Known Allergies  Follow-up Information    [name], NP.   Specialty: Internal Medicine Contact information: [address] [place] [address] [phone]                The results of significant diagnostics from this hospitalization (including imaging, microbiology, ancillary and laboratory)  are listed below for reference.    Significant Diagnostic Studies: CT ABDOMEN PELVIS W CONTRAST  Result Date: 03/02/2020 CLINICAL DATA:  Left sided lower abdominal and pelvic pain, elevated white blood cell count, urinary tract infection EXAM: CT ABDOMEN AND PELVIS WITH CONTRAST TECHNIQUE: Multidetector CT imaging of the abdomen and pelvis was performed using the standard protocol following bolus administration of intravenous contrast. CONTRAST:  OMNIPAQUE IOHEXOL 300 MG/ML  SOLN COMPARISON:  10/15/2018 FINDINGS: Lower chest: No acute pleural or parenchymal lung disease. Hepatobiliary: No focal liver abnormality is seen. Status post cholecystectomy. No biliary dilatation. Pancreas: Unremarkable. No pancreatic ductal dilatation or surrounding inflammatory changes. Spleen: Normal in size without focal abnormality. Adrenals/Urinary Tract: The left kidney is enlarged and edematous, with heterogeneous enhancement and a striated nephrogram appearance consistent with diffuse left-sided pyelonephritis. No evidence of renal abscess. The right kidney enhances normally. The adrenals are unremarkable. Bladder is unremarkable. Stomach/Bowel: No bowel obstruction or ileus. Minimal diverticulosis of the distal colon without diverticulitis. Normal appendix right lower quadrant. No bowel wall thickening or inflammatory change. Vascular/Lymphatic: No significant vascular findings are present. No enlarged abdominal or pelvic lymph nodes. Reproductive: Uterus and bilateral adnexa are unremarkable. Other: No free fluid or free gas. Small bilateral fat containing inguinal hernias unchanged. No bowel herniation. Musculoskeletal: No acute or destructive bony lesions. Reconstructed images demonstrate no additional findings. IMPRESSION: 1. Diffuse left-sided pyelonephritis.  No evidence of renal abscess. 2. Minimal distal colonic diverticulosis without diverticulitis. Electronically Signed   By: Sharlet Salina M.D.   On:  03/02/2020 15:55   DG Chest Port 1 View  Result Date: 03/05/2020 CLINICAL DATA:  Fever and shortness of breath EXAM: PORTABLE CHEST 1 VIEW COMPARISON:  March 02, 2020 FINDINGS: There is airspace opacity in the right lower lung region. There is mild left base atelectasis. Lungs elsewhere are clear. The heart size and pulmonary vascularity are normal. No adenopathy. No bone lesions. IMPRESSION: Airspace opacity consistent with pneumonia right lower lung region. Slight left base atelectasis. Lungs elsewhere clear. Heart size normal. Electronically Signed   By: Bretta Bang III M.D.   On: 03/05/2020 10:33   DG Chest Portable 1 View  Result Date: 03/02/2020 CLINICAL DATA:  Elevated white blood cell count and liver enzymes. EXAM: PORTABLE CHEST 1 VIEW COMPARISON:  PA and lateral chest 05/29/2015. FINDINGS: Lungs clear. Heart size normal. No pneumothorax or pleural fluid. No bony abnormality. IMPRESSION: Normal chest. Electronically Signed   By: Drusilla Kanner M.D.   On: 03/02/2020 14:28    Microbiology: Recent Results (from the past 240 hour(s))  Urine Culture     Status: None   Collection  Time: 03/01/20  2:22 PM   Specimen: Urine  Result Value Ref Range Status   MICRO NUMBER: [id number]  Final   SPECIMEN QUALITY: Adequate  Final   Sample Source NOT GIVEN  Final   STATUS: FINAL  Final   ISOLATE 1:   Final    Growth of mixed flora was isolated, suggesting probable contamination. No further testing will be performed. If clinically indicated, recollection using a method to minimize contamination, with prompt transfer to Urine Culture Transport Tube, is  recommended.   Urine Culture     Status: Abnormal   Collection Time: 03/02/20 12:31 PM   Specimen: Urine, Random  Result Value Ref Range Status   Specimen Description   Final    URINE, RANDOM Performed at [facility], [address] [place], [place] [address]    Special Requests   Final    NONE Performed at [facility], [address] [place], [place] [address]    Culture MULTIPLE SPECIES PRESENT, SUGGEST RECOLLECTION (A)  Final   Report Status 03/03/2020 FINAL  Final  Respiratory Panel by RT PCR (Flu A&B, Covid) - Nasopharyngeal Swab     Status: None   Collection Time: 03/02/20  1:14 PM   Specimen: Nasopharyngeal Swab  Result Value Ref Range Status   SARS Coronavirus 2 by RT PCR NEGATIVE NEGATIVE Final    Comment: (NOTE) SARS-CoV-2 target nucleic acids are NOT DETECTED.  The SARS-CoV-2 RNA is generally detectable in upper respiratoy specimens during the acute phase of infection. The lowest concentration of SARS-CoV-2 viral copies this assay can detect is 131 copies/mL. A negative result does not preclude SARS-Cov-2 infection and should not be used as the sole basis for treatment or other patient management decisions. A negative result may occur with  improper specimen collection/handling, submission of specimen other than nasopharyngeal swab, presence of viral mutation(s) within the areas targeted by this assay, and inadequate number of viral copies (<131 copies/mL). A negative result must be combined with clinical observations, patient history, and epidemiological information. The expected result is Negative.  Fact Sheet for Patients:  https://www.moore.com/  Fact Sheet for Healthcare Providers:  https://www.young.biz/  This test is no t yet approved or cleared by the [place] FDA and  has been authorized for detection and/or diagnosis of SARS-CoV-2 by FDA under an Emergency Use Authorization (EUA). This EUA will remain  in effect (meaning this test can be used) for the duration of the COVID-19 declaration under Section 564(b)(1) of the Act, 21 U.S.C. section 360bbb-3(b)(1), unless the authorization is terminated or revoked sooner.     Influenza A by PCR NEGATIVE NEGATIVE Final   Influenza B by PCR NEGATIVE NEGATIVE Final     Comment: (NOTE) The Xpert Xpress SARS-CoV-2/FLU/RSV assay is intended as an aid in  the diagnosis of influenza from Nasopharyngeal swab specimens and  should not be used as a sole basis for treatment. Nasal washings and  aspirates are unacceptable for Xpert Xpress SARS-CoV-2/FLU/RSV  testing.  Fact Sheet for Patients: https://www.moore.com/  Fact Sheet for Healthcare Providers: https://www.young.biz/  This test is not yet approved or cleared by the [place] FDA and  has been authorized for detection and/or diagnosis of SARS-CoV-2 by  FDA under an Emergency Use Authorization (EUA). This EUA will remain  in effect (meaning this test can be used) for the duration of the  Covid-19 declaration under Section 564(b)(1) of the Act, 21  U.S.C. section 360bbb-3(b)(1), unless the authorization is  terminated or  revoked. Performed at [facility], [address] [place], [place] [address]   Gastrointestinal Panel by PCR , Stool     Status: None   Collection Time: 03/02/20  5:54 PM   Specimen: Stool  Result Value Ref Range Status   Campylobacter species NOT DETECTED NOT DETECTED Final   Plesimonas shigelloides NOT DETECTED NOT DETECTED Final   Salmonella species NOT DETECTED NOT DETECTED Final   Yersinia enterocolitica NOT DETECTED NOT DETECTED Final   Vibrio species NOT DETECTED NOT DETECTED Final   Vibrio cholerae NOT DETECTED NOT DETECTED Final   Enteroaggregative E coli (EAEC) NOT DETECTED NOT DETECTED Final   Enteropathogenic E coli (EPEC) NOT DETECTED NOT DETECTED Final   Enterotoxigenic E coli (ETEC) NOT DETECTED NOT DETECTED Final   Shiga like toxin producing E coli (STEC) NOT DETECTED NOT DETECTED Final   Shigella/Enteroinvasive E coli (EIEC) NOT DETECTED NOT DETECTED Final   Cryptosporidium NOT DETECTED NOT DETECTED Final   Cyclospora cayetanensis NOT DETECTED NOT DETECTED Final   Entamoeba histolytica NOT DETECTED NOT  DETECTED Final   Giardia lamblia NOT DETECTED NOT DETECTED Final   Adenovirus F40/41 NOT DETECTED NOT DETECTED Final   Astrovirus NOT DETECTED NOT DETECTED Final   Norovirus GI/GII NOT DETECTED NOT DETECTED Final   Rotavirus A NOT DETECTED NOT DETECTED Final   Sapovirus (I, II, IV, and V) NOT DETECTED NOT DETECTED Final    Comment: Performed at [facility], [address] [place], [place] [address]  C Difficile Quick Screen w PCR reflex     Status: None   Collection Time: 03/02/20  7:12 PM   Specimen: Stool  Result Value Ref Range Status   C Diff antigen NEGATIVE NEGATIVE Final   C Diff toxin NEGATIVE NEGATIVE Final   C Diff interpretation No C. difficile detected.  Final    Comment: Performed at [facility], [address] [place], [place] [address]  Blood culture (routine x 2)     Status: None   Collection Time: 03/02/20  9:50 PM   Specimen: BLOOD  Result Value Ref Range Status   Specimen Description BLOOD RIGHT WRIST  Final   Special Requests   Final    BOTTLES DRAWN AEROBIC AND ANAEROBIC Blood Culture adequate volume   Culture   Final    NO GROWTH 5 DAYS Performed at [facility], [address] [place], [place] [address]    Report Status 03/07/2020 FINAL  Final  Blood culture (routine x 2)     Status: None   Collection Time: 03/02/20  9:50 PM   Specimen: BLOOD  Result Value Ref Range Status   Specimen Description BLOOD LEFT ASSIST CONTROL  Final   Special Requests   Final    BOTTLES DRAWN AEROBIC AND ANAEROBIC Blood Culture adequate volume   Culture   Final    NO GROWTH 5 DAYS Performed at [facility], [address] [place], [place] [address]    Report Status 03/07/2020 FINAL  Final  CULTURE, BLOOD (ROUTINE X 2) w Reflex to ID Panel     Status: None (Preliminary result)   Collection Time: 03/05/20  9:05 AM   Specimen: BLOOD  Result Value Ref Range Status   Specimen Description BLOOD L HAND  Final   Special Requests    Final    BOTTLES DRAWN AEROBIC AND ANAEROBIC Blood Culture adequate volume   Culture   Final    NO GROWTH 2 DAYS Performed at [facility], [address] [place], [place] [address]    Report Status PENDING  Incomplete  CULTURE, BLOOD (ROUTINE X 2) w Reflex to ID Panel     Status: None (Preliminary result)   Collection Time: 03/05/20 10:19 AM   Specimen: BLOOD  Result Value Ref Range Status   Specimen Description BLOOD L HAND  Final   Special Requests   Final    BOTTLES DRAWN AEROBIC AND ANAEROBIC Blood Culture adequate volume   Culture   Final    NO GROWTH 2 DAYS Performed at [facility], [address], [place], [place] [address]    Report Status PENDING  Incomplete  Urine Culture     Status: None   Collection Time: 03/05/20 10:38 AM   Specimen: Urine, Random  Result Value Ref Range Status   Specimen Description   Final    URINE, RANDOM Performed at [facility], [address], [place], [place] [address]    Special Requests   Final    NONE Performed at [facility], [address], [place], [place] [address]    Culture   Final    NO GROWTH Performed at [facility], [address] [place], [place] [address]    Report Status 03/06/2020 FINAL  Final     Labs: Basic Metabolic Panel: Recent Labs  Lab 03/03/20 0129 03/04/20 0521 03/05/20 0436 03/06/20 0645 03/06/20 1808 03/07/20 0524  NA 135 135 142 141  --  142  K 3.4* 3.4* 3.5 3.1*  --  3.6  CL 108 108 111 104  --  107  CO2 17* 21* 21* 24  --  25  GLUCOSE 99 100* 99 96  --  94  BUN 16 15 12 13   --  15  CREATININE 1.04* 1.26* 1.18* 1.09*  --  0.99  CALCIUM 7.5* 7.6* 8.3* 8.5*  --  8.6*  MG  --   --   --   --  2.5*  --    Liver Function Tests: Recent Labs  Lab 03/03/20 0129 03/04/20 0521 03/05/20 0436 03/06/20 0645 03/07/20 0524  AST 30 190* 62* 41 31  ALT 82* 246* 169* 117* 92*  ALKPHOS 110 259* 283* 234* 197*  BILITOT 0.7 0.7 0.6 0.6 0.4  PROT 6.5  6.2* 7.1 7.1 6.7  ALBUMIN 3.1* 2.8* 3.1* 3.1* 2.8*   Recent Labs  Lab 03/02/20 1131 03/02/20 1231  LIPASE 28.0 20   No results for input(s): AMMONIA in the last 168 hours. CBC: Recent Labs  Lab 03/01/20 1452 03/02/20 1231 03/03/20 0129 03/04/20 0521 03/05/20 0436 03/06/20 0645 03/07/20 0524  WBC 18.5 Repeated and verified X2.*   < > 10.0 8.2 10.4 9.5 7.9  NEUTROABS 15.4*  --   --  5.2 7.0 6.0 4.4  HGB 11.6*   < > 9.5* 9.3* 10.4* 9.3* 10.2*  HCT 34.2*   < > 28.9* 28.0* 30.9* 28.6* 30.5*  MCV 88.6   < > 89.8 88.6 87.8 89.1 87.9  PLT 225.0   < > 197 238 279 320 369   < > = values in this interval not displayed.   Cardiac Enzymes: Recent Labs  Lab 03/02/20 1231  CKTOTAL 34*   BNP: BNP (last 3 results) No results for input(s): BNP in the last 8760 hours.  ProBNP (last 3 results) No results for input(s): PROBNP in the last 8760 hours.  CBG: No results for input(s): GLUCAP in the last 168 hours.  Signed:  Briant CedarNkeiruka J Ruthmary Occhipinti, MD [facility] 03/07/2020, 10:50 AM

## 2020-03-06 NOTE — Progress Notes (Addendum)
PROGRESS NOTE  Krista AschoffDiane S Carpenter ZOX:[id number] DOB: Mar 14, 1966 DOA: 03/02/2020 PCP: Krista Carpenter, Krista Carpenter  HPI/Recap of past 24 hours: HPI from Dr Krista Carpenter is a 54 y.o. female with past medical history of HTN, HLD, depression, prediabetic, diverticulosis, IBS, insomnia, migraine as reviewed from EMR, presented at [facility] ED with complaining of fever, nausea vomiting diarrhea and abdominal pain for the past few days. As per patient she started feeling sick, felt she might be getting UTI, she got Covid test done which was negative. Patient went to primary care physician and she got antibiotics for UTI, patient was having diarrhea and left lower quadrant pain so she thought maybe she is having diverticulitis.  Patient's condition got worse with intractable vomiting, diarrhea. Patient got blood work done and she was informed that her WBC count and LFTs are high so she should go to the ED. In the ED, noted L flank tenderness, elevated WBC count, transaminitis, AKI creatinine 1.37, tachycardic. CT abdomen positive for left-sided pyelonephritis. Patient was given IV antibiotics ceftriaxone and IV fluids.  Admitted for further management.    Today, patient denies any new complaints, denies any worsening shortness of breath, denies any abdominal pain, nausea/vomiting, diarrhea, fever/chills.  Reports some mild migraine   Assessment/Plan: Principal Problem:   Pyelonephritis of left kidney Active Problems:   Migraine   Adjustment disorder with mixed anxiety and depressed mood   Prediabetes   Hyperlipidemia   Essential hypertension   Insomnia   Sepsis (HCC)   AKI (acute kidney injury) (HCC)   Transaminitis   Severe sepsis 2/2 acute pyelonephritis On admission, tachycardic, febrile, elevated WBC, AKI, transaminitis Currently afebrile, last temp was on 03/04/2020, 103.79F, with no leukocytosis BC x2 NGTD, repeat NGTD CT abdomen showed left pyelonephritis, without abscess UA showed  small leukocytes, large hemoglobin, negative nitrite, few bacteria, greater than 50 WBC (has received antibiotics prior to the sample), urine culture showed multiple species, repeat no growth as expected Continue cefepime D/C IV fluids, continue pain management Monitor closely  Acute hypoxic respiratory failure ?CAP Noted to desaturate on room air upon ambulation, new nonproductive cough, with fever Chest x-ray with right lower lobe pneumonia Continue cefepime, azithromycin Incentive spirometry Supplemental oxygen as needed, encouraged to ambulate  AKI Improved Likely 2/2 above D/C IV fluids, encourage oral hydration Daily CMP  Transaminitis Improved Likely 2/2 sepsis INR WNL, acute hepatitis panel non-reactive  Hypokalemia Replace as needed  History of IBS Likely ??IBS flare, with now resolved diarrhea C. difficile, GI panel negative Continue Bentyl, PPI, Imodium as needed Advance diet as tolerated  Normal anion gap metabolic acidosis Likely 2/2 GI losses Encourage oral intake Daily CMP  Hypertension BP now uncontrolled Resume home losartan at a lower dose for now IV hydralazine as needed  Normocytic anemia May be worsened by hemodilution Anemia panel showed iron 31, sats 13 No signs of obvious significant bleeding Daily CBC  History of migraine Fioricet as needed, Imitrex prn  Depression Continue Prozac  Insomnia Continue trazodone       Malnutrition Type:      Malnutrition Characteristics:      Nutrition Interventions:       Estimated body mass index is 29.29 kg/m as calculated from the following:   Height as of this encounter: 4\' 11"  (1.499 m).   Weight as of this encounter: 65.8 kg.     Code Status: Full  Family Communication: Discussed extensively with patient and husband at bedside on 03/06/2020  Disposition Plan:  Status is: Inpatient  Remains inpatient appropriate because:Inpatient level of care appropriate due to  severity of illness   Dispo: The patient is from: Home              Anticipated d/c is to: Home              Anticipated d/c date is: 1 day              Patient currently is not medically stable to d/c.    Consultants:  None  Procedures:  None  Antimicrobials:  Cefepime  Azithromycin  DVT prophylaxis: Lovenox   Objective: Vitals:   03/05/20 2009 03/06/20 0002 03/06/20 0606 03/06/20 1100  BP: (!) 155/83 (!) 145/83 (!) 145/85 (!) 166/75  Pulse: 71 83 83 64  Resp: 18 19 17 16   Temp: 99.6 F (37.6 C) 98.7 F (37.1 C) 98.6 F (37 C) 98.1 F (36.7 C)  TempSrc: Oral Oral Oral Oral  SpO2: 95% 97% 97% 99%  Weight:      Height:        Intake/Output Summary (Last 24 hours) at 03/06/2020 1523 Last data filed at 03/06/2020 03/08/2020 Gross per 24 hour  Intake 0 ml  Output 950 ml  Net -950 ml   Filed Weights   03/02/20 1229  Weight: 65.8 kg    Exam:  General: NAD  Cardiovascular: S1, S2 present  Respiratory:  Diminished breath sounds bilaterally  Abdomen: Soft, nontender, nondistended, bowel sounds present  Musculoskeletal: No bilateral pedal edema noted  Skin: Normal  Psychiatry: Normal mood   Data Reviewed: CBC: Recent Labs  Lab 03/01/20 1452 03/01/20 1452 03/02/20 1231 03/03/20 0129 03/04/20 0521 03/05/20 0436 03/06/20 0645  WBC 18.5 Repeated and verified X2.*   < > 11.9* 10.0 8.2 10.4 9.5  NEUTROABS 15.4*  --   --   --  5.2 7.0 6.0  HGB 11.6*   < > 12.2 9.5* 9.3* 10.4* 9.3*  HCT 34.2*   < > 36.5 28.9* 28.0* 30.9* 28.6*  MCV 88.6   < > 89.2 89.8 88.6 87.8 89.1  PLT 225.0   < > 223 197 238 279 320   < > = values in this interval not displayed.   Basic Metabolic Panel: Recent Labs  Lab 03/02/20 1231 03/03/20 0129 03/04/20 0521 03/05/20 0436 03/06/20 0645  NA 135 135 135 142 141  K 3.7 3.4* 3.4* 3.5 3.1*  CL 102 108 108 111 104  CO2 22 17* 21* 21* 24  GLUCOSE 120* 99 100* 99 96  BUN 24* 16 15 12 13   CREATININE 1.37* 1.04* 1.26*  1.18* 1.09*  CALCIUM 8.9 7.5* 7.6* 8.3* 8.5*   GFR: Estimated Creatinine Clearance: 49.2 mL/min (A) (by C-G formula based on SCr of 1.09 mg/dL (H)). Liver Function Tests: Recent Labs  Lab 03/02/20 1231 03/03/20 0129 03/04/20 0521 03/05/20 0436 03/06/20 0645  AST 52* 30 190* 62* 41  ALT 127* 82* 246* 169* 117*  ALKPHOS 126 110 259* 283* 234*  BILITOT 0.6 0.7 0.7 0.6 0.6  PROT 7.7 6.5 6.2* 7.1 7.1  ALBUMIN 3.8 3.1* 2.8* 3.1* 3.1*   Recent Labs  Lab 03/02/20 1131 03/02/20 1231  LIPASE 28.0 20   No results for input(s): AMMONIA in the last 168 hours. Coagulation Profile: Recent Labs  Lab 03/04/20 0833  INR 1.2   Cardiac Enzymes: Recent Labs  Lab 03/02/20 1231  CKTOTAL 34*   BNP (last 3 results) No results for input(s): PROBNP in the last 8760  hours. HbA1C: No results for input(s): HGBA1C in the last 72 hours. CBG: No results for input(s): GLUCAP in the last 168 hours. Lipid Profile: No results for input(s): CHOL, HDL, LDLCALC, TRIG, CHOLHDL, LDLDIRECT in the last 72 hours. Thyroid Function Tests: No results for input(s): TSH, T4TOTAL, FREET4, T3FREE, THYROIDAB in the last 72 hours. Anemia Panel: Recent Labs    03/05/20 0436  VITAMINB12 760  FOLATE 18.9  FERRITIN 499*  TIBC 248*  IRON 31   Urine analysis:    Component Value Date/Time   COLORURINE STRAW (A) 03/05/2020 1038   APPEARANCEUR CLEAR (A) 03/05/2020 1038   LABSPEC 1.003 (L) 03/05/2020 1038   PHURINE 6.0 03/05/2020 1038   GLUCOSEU NEGATIVE 03/05/2020 1038   HGBUR MODERATE (A) 03/05/2020 1038   BILIRUBINUR NEGATIVE 03/05/2020 1038   BILIRUBINUR neg 03/01/2020 1421   KETONESUR NEGATIVE 03/05/2020 1038   PROTEINUR NEGATIVE 03/05/2020 1038   UROBILINOGEN 0.2 03/01/2020 1421   NITRITE NEGATIVE 03/05/2020 1038   LEUKOCYTESUR NEGATIVE 03/05/2020 1038   Sepsis Labs: @LABRCNTIP (procalcitonin:4,lacticidven:4)  ) Recent Results (from the past 240 hour(s))  Urine Culture     Status: None    Collection Time: 03/01/20  2:22 PM   Specimen: Urine  Result Value Ref Range Status   MICRO NUMBER: 13/09/21  Final   SPECIMEN QUALITY: Adequate  Final   Sample Source NOT GIVEN  Final   STATUS: FINAL  Final   ISOLATE 1:   Final    Growth of mixed flora was isolated, suggesting probable contamination. No further testing will be performed. If clinically indicated, recollection using a method to minimize contamination, with prompt transfer to Urine Culture Transport Tube, is  recommended.   Urine Culture     Status: Abnormal   Collection Time: 03/02/20 12:31 PM   Specimen: Urine, Random  Result Value Ref Range Status   Specimen Description   Final    URINE, RANDOM Performed at [facility], [address], [place], [place]    Special Requests   Final    NONE Performed at [facility], [address] [place], [place]    Culture MULTIPLE SPECIES PRESENT, SUGGEST RECOLLECTION (A)  Final   Report Status 03/03/2020 FINAL  Final  Respiratory Panel by RT PCR (Flu A&B, Covid) - Nasopharyngeal Swab     Status: None   Collection Time: 03/02/20  1:14 PM   Specimen: Nasopharyngeal Swab  Result Value Ref Range Status   SARS Coronavirus 2 by RT PCR NEGATIVE NEGATIVE Final    Comment: (NOTE) SARS-CoV-2 target nucleic acids are NOT DETECTED.  The SARS-CoV-2 RNA is generally detectable in upper respiratoy specimens during the acute phase of infection. The lowest concentration of SARS-CoV-2 viral copies this assay can detect is 131 copies/mL. A negative result does not preclude SARS-Cov-2 infection and should not be used as the sole basis for treatment or other patient management decisions. A negative result may occur with  improper specimen collection/handling, submission of specimen other than nasopharyngeal swab, presence of viral mutation(s) within the areas targeted by this assay, and inadequate number of viral copies (<131 copies/mL). A negative  result must be combined with clinical observations, patient history, and epidemiological information. The expected result is Negative.  Fact Sheet for Patients:  13/10/21  Fact Sheet for Healthcare Providers:  https://www.moore.com/  This test is no t yet approved or cleared by the https://www.young.biz/ FDA and  has been authorized for detection and/or diagnosis of SARS-CoV-2 by FDA under  an Emergency Use Authorization (EUA). This EUA will remain  in effect (meaning this test can be used) for the duration of the COVID-19 declaration under Section 564(b)(1) of the Act, 21 U.S.C. section 360bbb-3(b)(1), unless the authorization is terminated or revoked sooner.     Influenza A by PCR NEGATIVE NEGATIVE Final   Influenza B by PCR NEGATIVE NEGATIVE Final    Comment: (NOTE) The Xpert Xpress SARS-CoV-2/FLU/RSV assay is intended as an aid in  the diagnosis of influenza from Nasopharyngeal swab specimens and  should not be used as a sole basis for treatment. Nasal washings and  aspirates are unacceptable for Xpert Xpress SARS-CoV-2/FLU/RSV  testing.  Fact Sheet for Patients: https://www.moore.com/  Fact Sheet for Healthcare Providers: https://www.young.biz/  This test is not yet approved or cleared by the [place] FDA and  has been authorized for detection and/or diagnosis of SARS-CoV-2 by  FDA under an Emergency Use Authorization (EUA). This EUA will remain  in effect (meaning this test can be used) for the duration of the  Covid-19 declaration under Section 564(b)(1) of the Act, 21  U.S.C. section 360bbb-3(b)(1), unless the authorization is  terminated or revoked. Performed at [facility], [address] [place], [place] [address]   Gastrointestinal Panel by PCR , Stool     Status: None   Collection Time: 03/02/20  5:54 PM   Specimen: Stool  Result Value Ref Range Status    Campylobacter species NOT DETECTED NOT DETECTED Final   Plesimonas shigelloides NOT DETECTED NOT DETECTED Final   Salmonella species NOT DETECTED NOT DETECTED Final   Yersinia enterocolitica NOT DETECTED NOT DETECTED Final   Vibrio species NOT DETECTED NOT DETECTED Final   Vibrio cholerae NOT DETECTED NOT DETECTED Final   Enteroaggregative E coli (EAEC) NOT DETECTED NOT DETECTED Final   Enteropathogenic E coli (EPEC) NOT DETECTED NOT DETECTED Final   Enterotoxigenic E coli (ETEC) NOT DETECTED NOT DETECTED Final   Shiga like toxin producing E coli (STEC) NOT DETECTED NOT DETECTED Final   Shigella/Enteroinvasive E coli (EIEC) NOT DETECTED NOT DETECTED Final   Cryptosporidium NOT DETECTED NOT DETECTED Final   Cyclospora cayetanensis NOT DETECTED NOT DETECTED Final   Entamoeba histolytica NOT DETECTED NOT DETECTED Final   Giardia lamblia NOT DETECTED NOT DETECTED Final   Adenovirus F40/41 NOT DETECTED NOT DETECTED Final   Astrovirus NOT DETECTED NOT DETECTED Final   Norovirus GI/GII NOT DETECTED NOT DETECTED Final   Rotavirus A NOT DETECTED NOT DETECTED Final   Sapovirus (I, II, IV, and V) NOT DETECTED NOT DETECTED Final    Comment: Performed at [facility], [address] [place], [place] [address]  C Difficile Quick Screen w PCR reflex     Status: None   Collection Time: 03/02/20  7:12 PM   Specimen: Stool  Result Value Ref Range Status   C Diff antigen NEGATIVE NEGATIVE Final   C Diff toxin NEGATIVE NEGATIVE Final   C Diff interpretation No C. difficile detected.  Final    Comment: Performed at [facility], [address] [place], [place] [address]  Blood culture (routine x 2)     Status: None (Preliminary result)   Collection Time: 03/02/20  9:50 PM   Specimen: BLOOD  Result Value Ref Range Status   Specimen Description BLOOD RIGHT WRIST  Final   Special Requests   Final    BOTTLES DRAWN AEROBIC AND ANAEROBIC Blood Culture adequate volume   Culture   Final  NO GROWTH 4 DAYS Performed at [facility], [address] [place], [place] [address]    Report Status PENDING  Incomplete  Blood culture (routine x 2)     Status: None (Preliminary result)   Collection Time: 03/02/20  9:50 PM   Specimen: BLOOD  Result Value Ref Range Status   Specimen Description BLOOD LEFT ASSIST CONTROL  Final   Special Requests   Final    BOTTLES DRAWN AEROBIC AND ANAEROBIC Blood Culture adequate volume   Culture   Final    NO GROWTH 4 DAYS Performed at [facility], [address], [place], [place] [address]    Report Status PENDING  Incomplete  CULTURE, BLOOD (ROUTINE X 2) w Reflex to ID Panel     Status: None (Preliminary result)   Collection Time: 03/05/20  9:05 AM   Specimen: BLOOD  Result Value Ref Range Status   Specimen Description BLOOD L HAND  Final   Special Requests   Final    BOTTLES DRAWN AEROBIC AND ANAEROBIC Blood Culture adequate volume   Culture   Final    NO GROWTH < 24 HOURS Performed at [facility], [address], [place], [place] [address]    Report Status PENDING  Incomplete  CULTURE, BLOOD (ROUTINE X 2) w Reflex to ID Panel     Status: None (Preliminary result)   Collection Time: 03/05/20 10:19 AM   Specimen: BLOOD  Result Value Ref Range Status   Specimen Description BLOOD L HAND  Final   Special Requests   Final    BOTTLES DRAWN AEROBIC AND ANAEROBIC Blood Culture adequate volume   Culture   Final    NO GROWTH < 24 HOURS Performed at [facility], [address], [place], [place] [address]    Report Status PENDING  Incomplete  Urine Culture     Status: None   Collection Time: 03/05/20 10:38 AM   Specimen: Urine, Random  Result Value Ref Range Status   Specimen Description   Final    URINE, RANDOM Performed at [facility], [address], [place], [place] [address]    Special Requests   Final    NONE Performed at [facility], [address],  [place], [place] [address]    Culture   Final    NO GROWTH Performed at [facility], [address] [place], [place] [address]    Report Status 03/06/2020 FINAL  Final      Studies: No results found.  Scheduled Meds: . azithromycin  500 mg Oral Daily  . dicyclomine  10 mg Oral TID AC  . enoxaparin (LOVENOX) injection  40 mg Subcutaneous Q24H  . FLUoxetine  40 mg Oral Daily  . losartan  50 mg Oral Daily  . pantoprazole  40 mg Oral Daily  . traZODone  50 mg Oral QHS    Continuous Infusions: . sodium chloride 250 mL (03/05/20 1547)  . ceFEPime (MAXIPIME) IV 2 g (03/06/20 0612)     LOS: 4 days     Briant Cedar, MD [facility]  If 7PM-7AM, please contact night-coverage [email or web address] 03/06/2020, 3:23 PM

## 2020-03-07 ENCOUNTER — Telehealth: Payer: Self-pay

## 2020-03-07 DIAGNOSIS — J189 Pneumonia, unspecified organism: Secondary | ICD-10-CM

## 2020-03-07 DIAGNOSIS — E785 Hyperlipidemia, unspecified: Secondary | ICD-10-CM

## 2020-03-07 LAB — CBC WITH DIFFERENTIAL/PLATELET
Abs Immature Granulocytes: 0.18 10*3/uL — ABNORMAL HIGH (ref 0.00–0.07)
Basophils Absolute: 0.1 10*3/uL (ref 0.0–0.1)
Basophils Relative: 1 %
Eosinophils Absolute: 0.4 10*3/uL (ref 0.0–0.5)
Eosinophils Relative: 6 %
HCT: 30.5 % — ABNORMAL LOW (ref 36.0–46.0)
Hemoglobin: 10.2 g/dL — ABNORMAL LOW (ref 12.0–15.0)
Immature Granulocytes: 2 %
Lymphocytes Relative: 26 %
Lymphs Abs: 2.1 10*3/uL (ref 0.7–4.0)
MCH: 29.4 pg (ref 26.0–34.0)
MCHC: 33.4 g/dL (ref 30.0–36.0)
MCV: 87.9 fL (ref 80.0–100.0)
Monocytes Absolute: 0.7 10*3/uL (ref 0.1–1.0)
Monocytes Relative: 9 %
Neutro Abs: 4.4 10*3/uL (ref 1.7–7.7)
Neutrophils Relative %: 56 %
Platelets: 369 10*3/uL (ref 150–400)
RBC: 3.47 MIL/uL — ABNORMAL LOW (ref 3.87–5.11)
RDW: 14.3 % (ref 11.5–15.5)
WBC: 7.9 10*3/uL (ref 4.0–10.5)
nRBC: 0 % (ref 0.0–0.2)

## 2020-03-07 LAB — CULTURE, BLOOD (ROUTINE X 2)
Culture: NO GROWTH
Culture: NO GROWTH
Special Requests: ADEQUATE
Special Requests: ADEQUATE

## 2020-03-07 LAB — COMPREHENSIVE METABOLIC PANEL
ALT: 92 U/L — ABNORMAL HIGH (ref 0–44)
AST: 31 U/L (ref 15–41)
Albumin: 2.8 g/dL — ABNORMAL LOW (ref 3.5–5.0)
Alkaline Phosphatase: 197 U/L — ABNORMAL HIGH (ref 38–126)
Anion gap: 10 (ref 5–15)
BUN: 15 mg/dL (ref 6–20)
CO2: 25 mmol/L (ref 22–32)
Calcium: 8.6 mg/dL — ABNORMAL LOW (ref 8.9–10.3)
Chloride: 107 mmol/L (ref 98–111)
Creatinine, Ser: 0.99 mg/dL (ref 0.44–1.00)
GFR, Estimated: >60 mL/min (ref >60)
Glucose, Bld: 94 mg/dL (ref 70–99)
Potassium: 3.6 mmol/L (ref 3.5–5.1)
Sodium: 142 mmol/L (ref 135–145)
Total Bilirubin: 0.4 mg/dL (ref 0.3–1.2)
Total Protein: 6.7 g/dL (ref 6.5–8.1)

## 2020-03-07 NOTE — Progress Notes (Signed)
Discharge order received. Patient mental status is at baseline. Vital signs stable . No signs of acute distress. Discharge instructions given. Patient verbalized understanding. No other issues noted at this time.   

## 2020-03-07 NOTE — Telephone Encounter (Signed)
Transition Care Management Follow-up Telephone Call  Date of discharge and from where: 03/07/2020, [facility]  How have you been since you were released from the hospital? Spoke with Onalee Hua (husband), HIPAA verified. Patient is doing better just still very tired.   Any questions or concerns? No  Items Reviewed:  Did the pt receive and understand the discharge instructions provided? Yes   Medications obtained and verified? Yes   Other? No   Any new allergies since your discharge? No   Dietary orders reviewed? Yes  Do you have support at home? Yes   Home Care and Equipment/Supplies: Were home health services ordered? not applicable If so, what is the name of the agency? N/A  Has the agency set up a time to come to the patient's home? not applicable Were any new equipment or medical supplies ordered?  No What is the name of the medical supply agency? N/A Were you able to get the supplies/equipment? not applicable Do you have any questions related to the use of the equipment or supplies? No  Functional Questionnaire: (I = Independent and D = Dependent) ADLs: I  Bathing/Dressing- I  Meal Prep- I  Eating- I  Maintaining continence- I  Transferring/Ambulation- I  Managing Meds- I  Follow up appointments reviewed:   PCP Hospital f/u appt confirmed? Yes  Scheduled to see Mayra Reel, NP 03/11/2020 on  @ 10:20 am.  [facility] f/u appt confirmed? N/a   Are transportation arrangements needed? No   If their condition worsens, is the pt aware to call PCP or go to the Emergency Dept.? Yes  Was the patient provided with contact information for the PCP's office or ED? Yes  Was to pt encouraged to call back with questions or concerns? Yes

## 2020-03-10 LAB — CULTURE, BLOOD (ROUTINE X 2)
Culture: NO GROWTH
Culture: NO GROWTH
Special Requests: ADEQUATE
Special Requests: ADEQUATE

## 2020-03-11 ENCOUNTER — Ambulatory Visit (INDEPENDENT_AMBULATORY_CARE_PROVIDER_SITE_OTHER): Payer: 59 | Admitting: Primary Care

## 2020-03-11 ENCOUNTER — Encounter: Payer: Self-pay | Admitting: Primary Care

## 2020-03-11 ENCOUNTER — Other Ambulatory Visit: Payer: Self-pay | Admitting: Primary Care

## 2020-03-11 ENCOUNTER — Other Ambulatory Visit: Payer: Self-pay

## 2020-03-11 VITALS — BP 130/78 | HR 79 | Temp 97.8°F | Ht 60.0 in | Wt 142.0 lb

## 2020-03-11 DIAGNOSIS — R7401 Elevation of levels of liver transaminase levels: Secondary | ICD-10-CM

## 2020-03-11 DIAGNOSIS — G43901 Migraine, unspecified, not intractable, with status migrainosus: Secondary | ICD-10-CM | POA: Diagnosis not present

## 2020-03-11 DIAGNOSIS — N179 Acute kidney failure, unspecified: Secondary | ICD-10-CM

## 2020-03-11 DIAGNOSIS — J189 Pneumonia, unspecified organism: Secondary | ICD-10-CM

## 2020-03-11 DIAGNOSIS — N12 Tubulo-interstitial nephritis, not specified as acute or chronic: Secondary | ICD-10-CM

## 2020-03-11 DIAGNOSIS — D509 Iron deficiency anemia, unspecified: Secondary | ICD-10-CM

## 2020-03-11 HISTORY — DX: Pneumonia, unspecified organism: J18.9

## 2020-03-11 LAB — CBC
HCT: 33.8 % — ABNORMAL LOW (ref 36.0–46.0)
Hemoglobin: 11 g/dL — ABNORMAL LOW (ref 12.0–15.0)
MCHC: 32.6 g/dL (ref 30.0–36.0)
MCV: 88 fl (ref 78.0–100.0)
Platelets: 612 10*3/uL — ABNORMAL HIGH (ref 150.0–400.0)
RBC: 3.84 Mil/uL — ABNORMAL LOW (ref 3.87–5.11)
RDW: 14.4 % (ref 11.5–15.5)
WBC: 8.8 10*3/uL (ref 4.0–10.5)

## 2020-03-11 LAB — COMPREHENSIVE METABOLIC PANEL
ALT: 51 U/L — ABNORMAL HIGH (ref 0–35)
AST: 27 U/L (ref 0–37)
Albumin: 4 g/dL (ref 3.5–5.2)
Alkaline Phosphatase: 166 U/L — ABNORMAL HIGH (ref 39–117)
BUN: 19 mg/dL (ref 6–23)
CO2: 27 mEq/L (ref 19–32)
Calcium: 9.3 mg/dL (ref 8.4–10.5)
Chloride: 104 mEq/L (ref 96–112)
Creatinine, Ser: 0.95 mg/dL (ref 0.40–1.20)
GFR: 68.61 mL/min (ref >60.00)
Glucose, Bld: 93 mg/dL (ref 70–99)
Potassium: 5 mEq/L (ref 3.5–5.1)
Sodium: 139 mEq/L (ref 135–145)
Total Bilirubin: 0.3 mg/dL (ref 0.2–1.2)
Total Protein: 7 g/dL (ref 6.0–8.3)

## 2020-03-11 LAB — IBC + FERRITIN
Ferritin: 184 ng/mL (ref 10.0–291.0)
Iron: 54 ug/dL (ref 42–145)
Saturation Ratios: 14.2 % — ABNORMAL LOW (ref 20.0–50.0)
Transferrin: 271 mg/dL (ref 212.0–360.0)

## 2020-03-11 MED ORDER — RIZATRIPTAN BENZOATE 10 MG PO TABS: 10.0000 mg | ORAL_TABLET | ORAL | 0 refills | Status: DC | PRN

## 2020-03-11 NOTE — Assessment & Plan Note (Signed)
Recent hospital admission. Urinary symptoms resolved, she does feel fatigued which could be secondary to her 1-2 week long migraine.  Repeat renal function and CBC pending.  Long discussion today about her pyelonephritis and potential causes.  It seems like she may be getting minor UTIs after intercourse.  She will speak to her GYN about antibiotic prophylaxis after intercourse.  Hospital notes, labs, imaging reviewed.

## 2020-03-11 NOTE — Assessment & Plan Note (Signed)
Treated during recent hospital admission.  Lungs clear on examination today.  Repeat chest x-ray 4 weeks post original chest x-ray.

## 2020-03-11 NOTE — Patient Instructions (Signed)
Stop sumatriptan for the migraine.  Start rizatriptan (Maxalt). Take 1 tablet now. May repeat in 2 hours if needed. Please update me if no improvement as discussed.   Stop by the lab prior to leaving today. I will notify you of your results once received.   Ensure you are consuming 64 ounces of water daily.  Come in around 4 weeks to repeat your chest xray.  It was a pleasure to see you today!

## 2020-03-11 NOTE — Assessment & Plan Note (Signed)
Chronic for years, no recent migraines until just recently.  Discussed options, she will start with increasing her sumatriptan to 50 mg and if no improvement then will start the rizatriptan 10 mg that I sent today.  If no improvement with the abortive treatment, we will proceed with daily preventative treatment until headaches resolve.  She will update.

## 2020-03-11 NOTE — Progress Notes (Signed)
Subjective:    Patient ID: Krista Carpenter, female    DOB: 09-17-1965, 54 y.o.   MRN: [id number]  HPI  This visit occurred during the SARS-CoV-2 public health emergency.  Safety protocols were in place, including screening questions prior to the visit, additional usage of staff PPE, and extensive cleaning of exam room while observing appropriate contact time as indicated for disinfecting solutions.   Krista Carpenter is a 54 year old female with a history of hypertension, AKI, diverticulitis, migaraines, urosepsis who presents today for [facility] hospital follow up.  She was initially seen in our office on 03/01/20 with a two day history of fatigue. The following day she felt chills, dysuria, urinary frequency, fevers of 101. Also with LLQ abdominal pain and left flank pain. UA concerning for cystitis, exam was questionable for diverticulitis and/or pyelonephritis so labs were obtained and Ciprofloxacin was prescribed. We called her the following day and symptoms progressed so she was encouraged to seek treatment in the ED.  She presented to [facility] ED on 03/02/20 as recommended. Work up consistent for pyelonephritis (CT abdomen/pelvis) and urosepsis. She did not have acute diverticulitis. She was admitted for further treatment. During her hospital stay she was treated with IV fluids and antibiotics. She spiked high fevers so IV antibiotics had to be changed. She was also noted to desaturate on room air, supplemental oxygen was supplied PRN, chest x-ray revealed right lower lobe pneumonia. Iron deficiency anemia was noted on labs, she denied gross bleeding.  She struggled with frontal headache prior to and throughout her admission so she was provided with as needed Fioricet with minimal improvement.  She improved gradually over the days. Discharged home on 03/07/20 with recommendation for PCP follow up. She was provided with a prescription for sumatriptan 25 mg for migraines.  Since her hospital stay she's  fatigued and really tired, constant frontal lobe headache (symptoms began over one week ago). Along with her headache she has sensitivity to smells, light, and neck pain. She's taken several doses of the sumatriptan prescribed with little improvement. She was once managed on Topamax for prevention of headaches, stopped Topamax as it was ineffective. She was once following with a headache specialist.   She denies urinary symptoms, fevers, chills. She has a history of UTI but infrequent diagnosis.  She does notice pelvic discomfort and fatigue a few days after intercourse, this has been chronic for years.  Typically, when she starts to notice these symptoms she will take Azo with resolve.  She has not discussed this with her GYN, but has an appointment soon.  Review of Systems  Constitutional: Positive for fatigue. Negative for fever.  Eyes: Positive for photophobia.  Respiratory: Negative for cough.   Genitourinary: Negative for dysuria, frequency and urgency.  Neurological: Positive for headaches.       Past Medical History:  Diagnosis Date   Acute diverticulitis    Diverticula of colon    sigmoid and descending colon   Diverticulitis 11/04/2018   History of abnormal cervical Pap smear    IBS (irritable bowel syndrome)    Kidney infection 02/2020   Migraine      Social History   Socioeconomic History   Marital status: Married    Spouse name: Not on file   Number of children: Not on file   Years of education: Not on file   Highest education level: Not on file  Occupational History   Not on file  Tobacco Use   Smoking status: Never  Smoker   Smokeless tobacco: Never Used  Vaping Use   Vaping Use: Never used  Substance and Sexual Activity   Alcohol use: Yes    Alcohol/week: 0.0 standard drinks    Comment: occasion   Drug use: No   Sexual activity: Yes    Partners: Male    Birth control/protection: I.U.D.  Other Topics Concern   Not on file  Social  History Narrative   Married.   2 children.   Works at [employer].   Enjoys scrapbooking, reading.   Social Determinants of Health   Financial Resource Strain:    Difficulty of Paying Living Expenses: Not on file  Food Insecurity:    Worried About Programme researcher, broadcasting/film/video in the Last Year: Not on file   [employer] of Food in the Last Year: Not on file  Transportation Needs:    Lack of Transportation (Medical): Not on file   Lack of Transportation (Non-Medical): Not on file  Physical Activity:    Days of Exercise per Week: Not on file   Minutes of Exercise per Session: Not on file  Stress:    Feeling of Stress : Not on file  Social Connections:    Frequency of Communication with Friends and Family: Not on file   Frequency of Social Gatherings with Friends and Family: Not on file   Attends Religious Services: Not on file   Active Member of Clubs or Organizations: Not on file   Attends Banker Meetings: Not on file   Marital Status: Not on file  Intimate Partner Violence:    Fear of Current or Ex-Partner: Not on file   Emotionally Abused: Not on file   Physically Abused: Not on file   Sexually Abused: Not on file    Past Surgical History:  Procedure Laterality Date   CHOLECYSTECTOMY     colonoscopy     TONSILLECTOMY     WRIST ARTHROCENTESIS      Family History  Problem Relation Age of Onset   Cancer Mother        skin   Diabetes Father    Diabetes Brother     No Known Allergies  Current Outpatient Medications on File Prior to Visit  Medication Sig Dispense Refill   celecoxib (CELEBREX) 200 MG capsule TAKE 1 CAPSULE BY MOUTH TWICE A DAY 60 capsule 3   COMBIPATCH 0.05-0.14 MG/DAY Place 1 patch onto the skin 2 (two) times a week.     dicyclomine (BENTYL) 10 MG capsule Take 1 capsule (10 mg total) by mouth 3 (three) times daily between meals as needed for up to 10 days for spasms. 30 capsule 0   FLUoxetine (PROZAC) 40 MG capsule  TAKE 1 CAPSULE BY MOUTH DAILY FOR ANXIETY AND DEPRESSION 30 capsule 5   losartan (COZAAR) 100 MG tablet Take 1 tablet (100 mg total) by mouth daily. For blood pressure. 90 tablet 3   pantoprazole (PROTONIX) 40 MG tablet Take 1 tablet (40 mg total) by mouth daily. 30 tablet 0   PREMARIN vaginal cream Insert 0.5 applicatorful every day by vaginal route for two weeks then to twice a week     traZODone (DESYREL) 50 MG tablet TAKE 1.5 TABLETS (75 MG TOTAL) BY MOUTH AT BEDTIME AS NEEDED FOR SLEEP. 45 tablet 5   No current facility-administered medications on file prior to visit.    BP 130/78    Pulse 79    Temp 97.8 F (36.6 C) (Temporal)    Ht  5' (1.524 m)    Wt 142 lb (64.4 kg)    LMP 12/02/2012    SpO2 97%    BMI 27.73 kg/m    Objective:   Physical Exam Constitutional:      Comments: Appears tired  Cardiovascular:     Rate and Rhythm: Normal rate and regular rhythm.  Pulmonary:     Effort: Pulmonary effort is normal.     Breath sounds: Normal breath sounds.  Musculoskeletal:     Cervical back: Neck supple.  Skin:    General: Skin is warm and dry.            Assessment & Plan:

## 2020-03-11 NOTE — Assessment & Plan Note (Signed)
Secondary to Sepsis? Repeat pending.

## 2020-03-11 NOTE — Assessment & Plan Note (Signed)
No prior known diagnosis. Repeat iron levels and CBC pending today. She denies gross vaginal or rectal bleeding.

## 2020-03-11 NOTE — Assessment & Plan Note (Signed)
Secondary to acute pyelonephritis with recent admission to hospital.  Repeat renal function pending today.  Encouraged plenty of oral intake with water and electrolytes.

## 2020-03-14 ENCOUNTER — Other Ambulatory Visit: Payer: Self-pay

## 2020-03-14 DIAGNOSIS — G4700 Insomnia, unspecified: Secondary | ICD-10-CM

## 2020-03-14 DIAGNOSIS — I10 Essential (primary) hypertension: Secondary | ICD-10-CM

## 2020-03-14 DIAGNOSIS — F4323 Adjustment disorder with mixed anxiety and depressed mood: Secondary | ICD-10-CM

## 2020-03-14 MED ORDER — FLUOXETINE HCL 40 MG PO CAPS: ORAL_CAPSULE | ORAL | 3 refills | Status: DC

## 2020-03-14 MED ORDER — LOSARTAN POTASSIUM 100 MG PO TABS: 100.0000 mg | ORAL_TABLET | Freq: Every day | ORAL | 3 refills | Status: DC

## 2020-03-14 MED ORDER — TRAZODONE HCL 50 MG PO TABS: 75.0000 mg | ORAL_TABLET | Freq: Every evening | ORAL | 1 refills | Status: DC | PRN

## 2020-03-21 ENCOUNTER — Encounter: Payer: Self-pay | Admitting: Primary Care

## 2020-04-01 ENCOUNTER — Other Ambulatory Visit: Payer: 59

## 2020-04-08 ENCOUNTER — Other Ambulatory Visit (INDEPENDENT_AMBULATORY_CARE_PROVIDER_SITE_OTHER): Payer: 59

## 2020-04-08 ENCOUNTER — Other Ambulatory Visit: Payer: Self-pay

## 2020-04-08 ENCOUNTER — Ambulatory Visit (INDEPENDENT_AMBULATORY_CARE_PROVIDER_SITE_OTHER)
Admission: RE | Admit: 2020-04-08 | Discharge: 2020-04-08 | Disposition: A | Discharge status: Home / Self Care | Payer: 59 | Source: Ambulatory Visit | Attending: Primary Care | Admitting: Primary Care

## 2020-04-08 DIAGNOSIS — R7989 Other specified abnormal findings of blood chemistry: Secondary | ICD-10-CM

## 2020-04-08 DIAGNOSIS — R7401 Elevation of levels of liver transaminase levels: Secondary | ICD-10-CM

## 2020-04-08 DIAGNOSIS — J189 Pneumonia, unspecified organism: Secondary | ICD-10-CM | POA: Diagnosis not present

## 2020-04-08 DIAGNOSIS — D509 Iron deficiency anemia, unspecified: Secondary | ICD-10-CM

## 2020-04-11 LAB — CBC
HCT: 36 % (ref 35.0–45.0)
Hemoglobin: 12.1 g/dL (ref 11.7–15.5)
MCH: 29.4 pg (ref 27.0–33.0)
MCHC: 33.6 g/dL (ref 32.0–36.0)
MCV: 87.6 fL (ref 80.0–100.0)
MPV: 9.6 fL (ref 7.5–12.5)
Platelets: 295 10*3/uL (ref 140–400)
RBC: 4.11 10*6/uL (ref 3.80–5.10)
RDW: 13.2 % (ref 11.0–15.0)
WBC: 6.9 10*3/uL (ref 3.8–10.8)

## 2020-04-11 LAB — IRON, TOTAL/TOTAL IRON BINDING CAP
%SAT: 20 % (calc) (ref 16–45)
Iron: 80 ug/dL (ref 45–160)
TIBC: 396 mcg/dL (calc) (ref 250–450)

## 2020-04-11 LAB — HEPATIC FUNCTION PANEL
AG Ratio: 1.9 (calc) (ref 1.0–2.5)
ALT: 11 U/L (ref 6–29)
AST: 13 U/L (ref 10–35)
Albumin: 4.3 g/dL (ref 3.6–5.1)
Alkaline phosphatase (APISO): 48 U/L (ref 37–153)
Bilirubin, Direct: 0.1 mg/dL (ref 0.0–0.2)
Globulin: 2.3 g/dL (calc) (ref 1.9–3.7)
Indirect Bilirubin: 0.2 mg/dL (calc) (ref 0.2–1.2)
Total Bilirubin: 0.3 mg/dL (ref 0.2–1.2)
Total Protein: 6.6 g/dL (ref 6.1–8.1)

## 2020-04-11 LAB — HEPATITIS PANEL, ACUTE
Hep A IgM: NONREACTIVE
Hep B C IgM: NONREACTIVE
Hepatitis B Surface Ag: NONREACTIVE
Hepatitis C Ab: NONREACTIVE
SIGNAL TO CUT-OFF: 0.01 (ref <1.00)

## 2020-04-11 LAB — TRANSFERRIN: Transferrin: 311 mg/dL (ref 188–341)

## 2020-04-11 LAB — FERRITIN: Ferritin: 21 ng/mL (ref 16–232)

## 2020-04-18 ENCOUNTER — Other Ambulatory Visit: Payer: Self-pay

## 2020-04-18 DIAGNOSIS — M545 Low back pain, unspecified: Secondary | ICD-10-CM | POA: Insufficient documentation

## 2020-05-24 ENCOUNTER — Other Ambulatory Visit: Payer: Self-pay | Admitting: Podiatry

## 2020-05-24 NOTE — Telephone Encounter (Signed)
Please advise °

## 2020-07-07 ENCOUNTER — Other Ambulatory Visit: Payer: Self-pay | Admitting: *Deleted

## 2020-07-07 MED ORDER — CELECOXIB 200 MG PO CAPS
200.0000 mg | ORAL_CAPSULE | Freq: Two times a day (BID) | ORAL | 1 refills | Status: DC
Start: 2020-07-07 — End: 2021-03-10

## 2020-12-02 ENCOUNTER — Other Ambulatory Visit: Payer: Self-pay | Admitting: Primary Care

## 2020-12-02 DIAGNOSIS — G4700 Insomnia, unspecified: Secondary | ICD-10-CM

## 2021-02-19 IMAGING — DX DG CHEST 2V
2 series · 2 of 2 positions shown · non-contrast
Comparison: 03/05/2020

CLINICAL DATA: Follow-up pneumonia

EXAM:
CHEST - 2 VIEW

[chest pa]
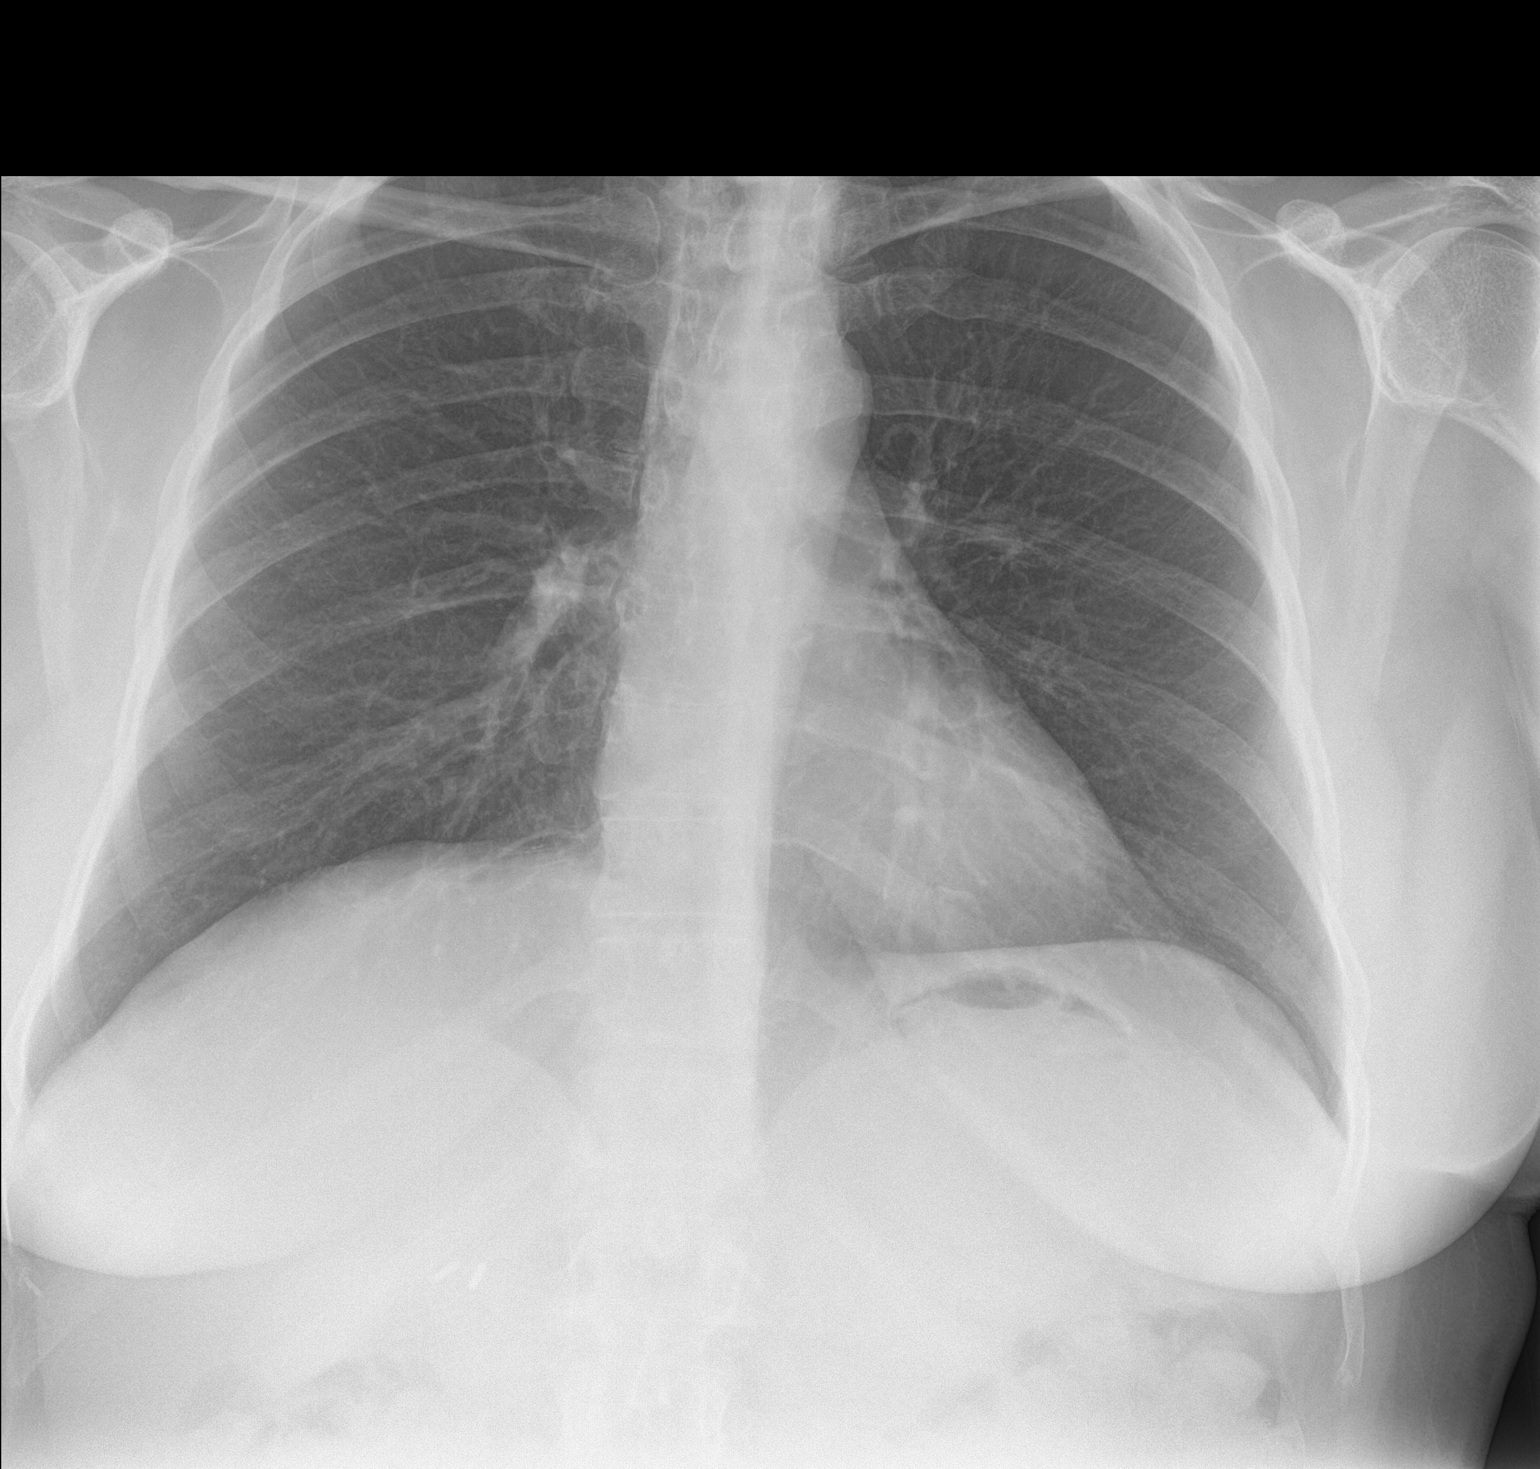

[chest lat]
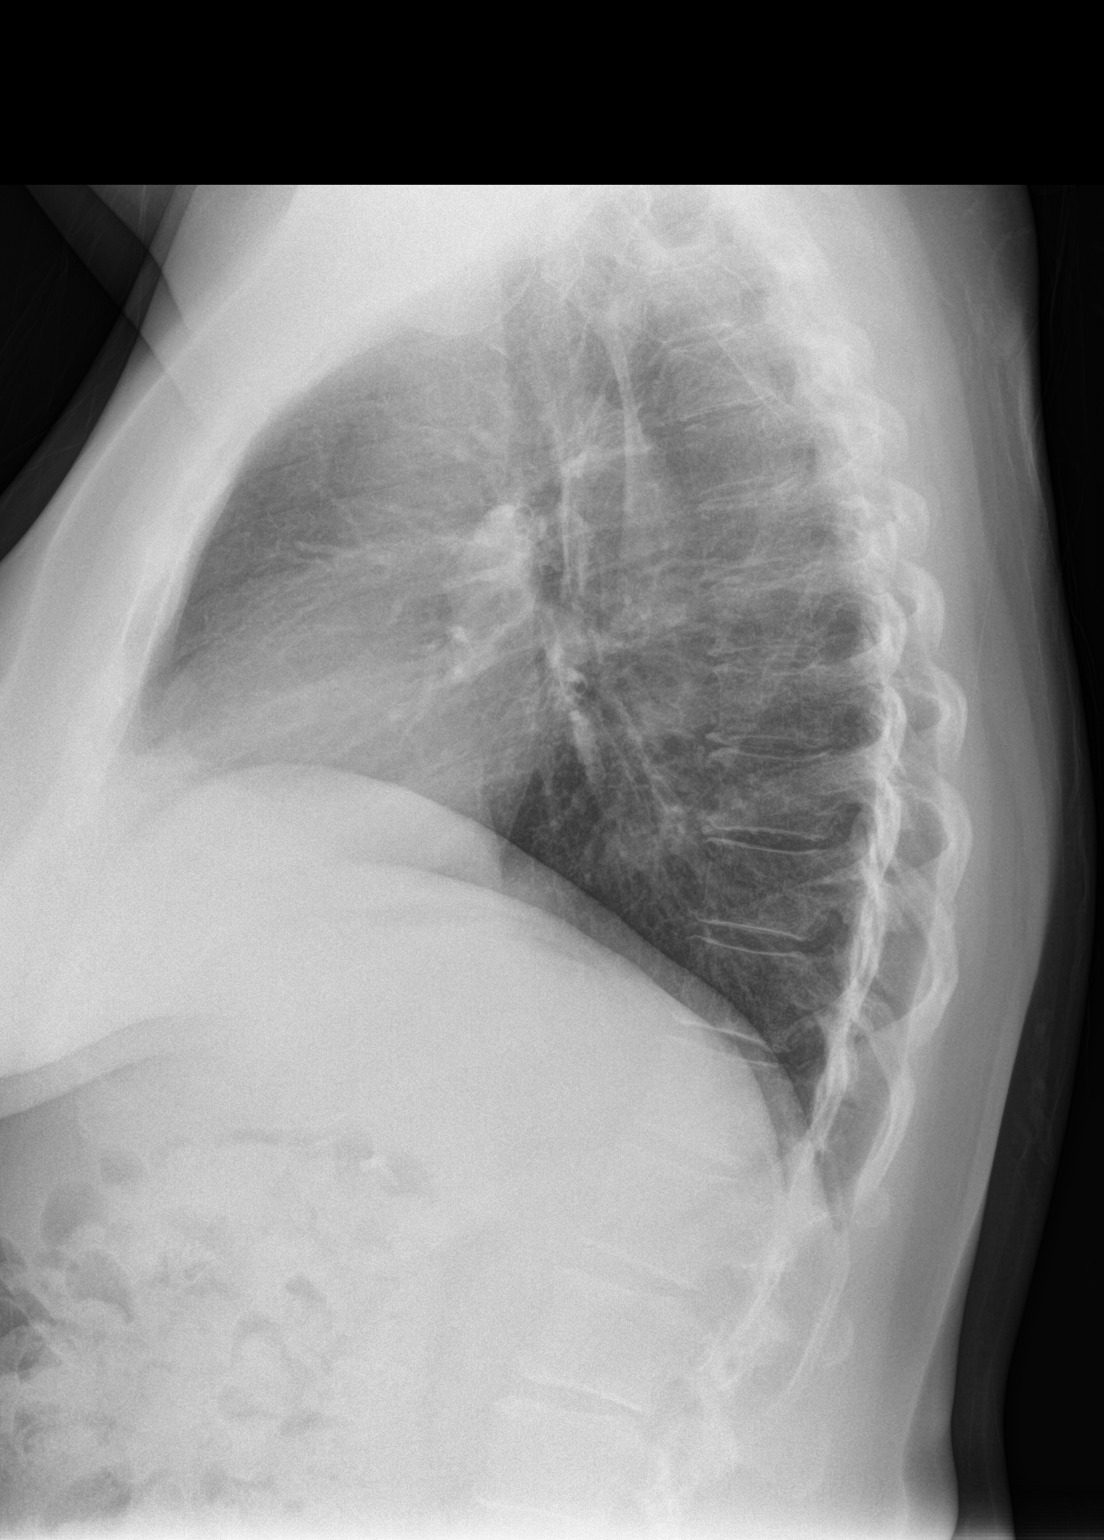

[2 of 2 positions shown; findings below may reference images not displayed]

FINDINGS: Clearing of previously noted right lower lobe pneumonia. No focal
opacity, pleural effusion or pneumothorax. Normal heart size. No
pneumothorax
IMPRESSION: Resolution of previously noted right lower lobe pneumonia.

## 2021-03-01 ENCOUNTER — Other Ambulatory Visit: Payer: Self-pay | Admitting: Primary Care

## 2021-03-01 DIAGNOSIS — F4323 Adjustment disorder with mixed anxiety and depressed mood: Secondary | ICD-10-CM

## 2021-03-10 ENCOUNTER — Other Ambulatory Visit: Payer: Self-pay | Admitting: Primary Care

## 2021-03-10 ENCOUNTER — Other Ambulatory Visit: Payer: Self-pay | Admitting: Podiatry

## 2021-03-10 DIAGNOSIS — G4700 Insomnia, unspecified: Secondary | ICD-10-CM

## 2021-04-09 ENCOUNTER — Other Ambulatory Visit: Payer: Self-pay | Admitting: Primary Care

## 2021-04-09 DIAGNOSIS — I10 Essential (primary) hypertension: Secondary | ICD-10-CM

## 2021-04-11 ENCOUNTER — Encounter: Payer: Self-pay | Admitting: Nurse Practitioner

## 2021-04-11 ENCOUNTER — Other Ambulatory Visit: Payer: Self-pay

## 2021-04-11 ENCOUNTER — Ambulatory Visit: Payer: 59 | Admitting: Nurse Practitioner

## 2021-04-11 VITALS — BP 130/62 | HR 73 | Temp 98.0°F | Ht 60.0 in | Wt 152.0 lb

## 2021-04-11 DIAGNOSIS — R059 Cough, unspecified: Secondary | ICD-10-CM | POA: Diagnosis not present

## 2021-04-11 DIAGNOSIS — R051 Acute cough: Secondary | ICD-10-CM | POA: Insufficient documentation

## 2021-04-11 DIAGNOSIS — J0100 Acute maxillary sinusitis, unspecified: Secondary | ICD-10-CM

## 2021-04-11 LAB — POC COVID19 BINAXNOW: SARS Coronavirus 2 Ag: NEGATIVE

## 2021-04-11 MED ORDER — AMOXICILLIN 875 MG PO TABS: 875.0000 mg | ORAL_TABLET | Freq: Two times a day (BID) | ORAL | 0 refills | Status: AC

## 2021-04-11 NOTE — Progress Notes (Signed)
Acute Office Visit  Subjective:    Patient ID: Krista Carpenter, female    DOB: 10/14/1965, 55 y.o.   MRN: [id number]  Chief Complaint  Patient presents with   Nasal Congestion    X 1 week    Facial Pain    Lots of pressure behind nose   Fatigue   Cough    Dry and productive with clear mucous    Neck Pain    All around the front and sides are tender and sore     Patient is in today for URI symptoms Started approx 8 days ago States symptoms have gotten worse No sick contacts Pfizer x2 and one booster No covid test at home  Has been taking alka seltzer plus cold. Helps temporarily  Past Medical History:  Diagnosis Date   Acute diverticulitis    Diverticula of colon    sigmoid and descending colon   Diverticulitis 11/04/2018   History of abnormal cervical Pap smear    IBS (irritable bowel syndrome)    Kidney infection 02/2020   Migraine     Past Surgical History:  Procedure Laterality Date   CHOLECYSTECTOMY     colonoscopy     TONSILLECTOMY     WRIST ARTHROCENTESIS      Family History  Problem Relation Age of Onset   Cancer Mother        skin   Diabetes Father    Diabetes Brother     Social History   Socioeconomic History   Marital status: Married    Spouse name: Not on file   Number of children: Not on file   Years of education: Not on file   Highest education level: Not on file  Occupational History   Not on file  Tobacco Use   Smoking status: Never   Smokeless tobacco: Never  Vaping Use   Vaping Use: Never used  Substance and Sexual Activity   Alcohol use: Yes    Alcohol/week: 0.0 standard drinks    Comment: occasion   Drug use: No   Sexual activity: Yes    Partners: Male    Birth control/protection: I.U.D.  Other Topics Concern   Not on file  Social History Narrative   Married.   2 children.   Works at [employer].   Enjoys scrapbooking, reading.   Social Determinants of Health   Financial Resource Strain: Not on file  Food  Insecurity: Not on file  Transportation Needs: Not on file  Physical Activity: Not on file  Stress: Not on file  Social Connections: Not on file  Intimate Partner Violence: Not on file    Outpatient Medications Prior to Visit  Medication Sig Dispense Refill   celecoxib (CELEBREX) 200 MG capsule TAKE 1 CAPSULE BY MOUTH  TWICE DAILY 180 capsule 3   COMBIPATCH 0.05-0.14 MG/DAY Place 1 patch onto the skin 2 (two) times a week.     FLUoxetine (PROZAC) 40 MG capsule TAKE 1 CAPSULE BY MOUTH DAILY FOR ANXIETY AND DEPRESSION. Office visit required for further refills. 30 capsule 0   losartan (COZAAR) 100 MG tablet Take 1 tablet (100 mg total) by mouth daily. For blood pressure. 90 tablet 3   Phenyleph-CPM-DM-APAP (ALKA-SELTZER PLUS COLD & COUGH) 08-22-08-325 MG CAPS Take 2 capsules by mouth as needed.     PREMARIN vaginal cream Insert 0.5 applicatorful every day by vaginal route for two weeks then to twice a week     rizatriptan (MAXALT) 10 MG tablet Take 1  tablet (10 mg total) by mouth as needed for migraine. Take 1 tablet by mouth at migraine onset. May repeat in 2 hours if needed. Do not exceed 2 tablets in 24 hours. 10 tablet 0   traZODone (DESYREL) 50 MG tablet Take 1.5 tablets (75 mg total) by mouth at bedtime. For sleep. Office visit required for further refills. 45 tablet 0   dicyclomine (BENTYL) 10 MG capsule Take 1 capsule (10 mg total) by mouth 3 (three) times daily between meals as needed for up to 10 days for spasms. 30 capsule 0   pantoprazole (PROTONIX) 40 MG tablet Take 1 tablet (40 mg total) by mouth daily. 30 tablet 0   No facility-administered medications prior to visit.    No Known Allergies  Review of Systems  Constitutional:  Positive for chills and fatigue. Negative for fever.  HENT:  Positive for congestion, ear pain, sinus pressure, sinus pain and sore throat.   Respiratory:  Positive for cough (clear). Negative for shortness of breath.   Cardiovascular:  Positive for  chest pain (with cough).  Gastrointestinal:  Negative for diarrhea, nausea and vomiting.  Musculoskeletal:  Positive for arthralgias and neck pain.  Neurological:  Positive for dizziness and headaches. Negative for weakness and numbness.      Objective:    Physical Exam HENT:     Right Ear: Tympanic membrane, ear canal and external ear normal. There is no impacted cerumen.     Left Ear: Tympanic membrane, ear canal and external ear normal. There is no impacted cerumen.     Nose:     Right Sinus: Maxillary sinus tenderness present. No frontal sinus tenderness.     Left Sinus: Maxillary sinus tenderness present. No frontal sinus tenderness.     Mouth/Throat:     Mouth: Mucous membranes are moist.     Pharynx: Posterior oropharyngeal erythema present. No oropharyngeal exudate.  Eyes:     Extraocular Movements: Extraocular movements intact.     Pupils: Pupils are equal, round, and reactive to light.  Cardiovascular:     Rate and Rhythm: Normal rate and regular rhythm.  Pulmonary:     Effort: Pulmonary effort is normal.     Breath sounds: Normal breath sounds.  Abdominal:     General: Bowel sounds are normal.  Lymphadenopathy:     Cervical: Cervical adenopathy present.  Skin:    General: Skin is warm.  Psychiatric:        Mood and Affect: Mood normal.        Behavior: Behavior normal.        Thought Content: Thought content normal.        Judgment: Judgment normal.    BP 130/62    Pulse 73    Temp 98 F (36.7 C) (Temporal)    Ht 5' (1.524 m)    Wt 152 lb (68.9 kg)    LMP 12/02/2012    SpO2 98%    BMI 29.69 kg/m  Wt Readings from Last 3 Encounters:  04/11/21 152 lb (68.9 kg)  03/11/20 142 lb (64.4 kg)  03/02/20 145 lb (65.8 kg)    Health Maintenance Due  Topic Date Due   Pneumococcal Vaccine 48-87 Years old (1 - PCV) Never done   HIV Screening  Never done   Zoster Vaccines- Shingrix (1 of 2) Never done   COVID-19 Vaccine (4 - Booster for Pfizer series) 03/16/2020   PAP  SMEAR-Modifier  02/21/2021    There are no preventive care reminders to  display for this patient.   Lab Results  Component Value Date   TSH 1.04 05/22/2019   Lab Results  Component Value Date   WBC 6.9 04/08/2020   HGB 12.1 04/08/2020   HCT 36.0 04/08/2020   MCV 87.6 04/08/2020   PLT 295 04/08/2020   Lab Results  Component Value Date   NA 139 03/11/2020   K 5.0 03/11/2020   CO2 27 03/11/2020   GLUCOSE 93 03/11/2020   BUN 19 03/11/2020   CREATININE 0.95 03/11/2020   BILITOT 0.3 04/08/2020   ALKPHOS 166 (H) 03/11/2020   AST 13 04/08/2020   ALT 11 04/08/2020   PROT 6.6 04/08/2020   ALBUMIN 4.0 03/11/2020   CALCIUM 9.3 03/11/2020   ANIONGAP 10 03/07/2020   GFR 68.61 03/11/2020   Lab Results  Component Value Date   CHOL 246 (H) 05/22/2019   Lab Results  Component Value Date   HDL 46.30 05/22/2019   No results found for: [facility] Lab Results  Component Value Date   TRIG 223.0 (H) 05/22/2019   Lab Results  Component Value Date   CHOLHDL 5 05/22/2019   Lab Results  Component Value Date   HGBA1C 5.7 05/22/2019       Assessment & Plan:   Problem List Items Addressed This Visit       Respiratory   Acute sinusitis    Given exam and patient's symptoms we will go ahead and treat.  Patient also had a sore throat that was erythematous did offer strep test patient but told her medication as prescribed would cover both did defer strep testing.  COVID-19 test was negative in office.  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  Return to clinic information discussed along with when to seek urgent or emergent health care.  Start amoxicillin 875 twice daily for 7 days.      Relevant Medications   Phenyleph-CPM-DM-APAP (ALKA-SELTZER PLUS COLD & COUGH) 08-22-08-325 MG CAPS   amoxicillin (AMOXIL) 875 MG tablet     Other   Cough in adult - Primary    Continue doing over-the-counter treatments for cough       Relevant Orders   POC COVID-19 (Completed)    No orders of the defined types were placed in this encounter.  This visit occurred during the SARS-CoV-2 public health emergency.  Safety protocols were in place, including screening questions prior to the visit, additional usage of staff PPE, and extensive cleaning of exam room while observing appropriate contact time as indicated for disinfecting solutions.   Audria Nine, NP

## 2021-04-11 NOTE — Assessment & Plan Note (Signed)
Given exam and patient's symptoms we will go ahead and treat.  Patient also had a sore throat that was erythematous did offer strep test patient but told her medication as prescribed would cover both did defer strep testing.  COVID-19 test was negative in office.  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  Return to clinic information discussed along with when to seek urgent or emergent health care.  Start amoxicillin 875 twice daily for 7 days.

## 2021-04-11 NOTE — Patient Instructions (Signed)
Nice to see you today Sent in antibiotic to pharmacy Will write you out for 48 hours from work If you do not start getting better let me know

## 2021-04-11 NOTE — Assessment & Plan Note (Signed)
Continue doing over-the-counter treatments for cough

## 2021-04-14 ENCOUNTER — Other Ambulatory Visit: Payer: Self-pay | Admitting: Primary Care

## 2021-04-14 DIAGNOSIS — F4323 Adjustment disorder with mixed anxiety and depressed mood: Secondary | ICD-10-CM

## 2021-04-21 ENCOUNTER — Encounter: Payer: Self-pay | Admitting: Nurse Practitioner

## 2021-04-26 ENCOUNTER — Other Ambulatory Visit: Payer: Self-pay | Admitting: Primary Care

## 2021-04-26 DIAGNOSIS — I10 Essential (primary) hypertension: Secondary | ICD-10-CM

## 2021-04-27 NOTE — Telephone Encounter (Signed)
Patient overdue for CPE, needs to be scheduled ASAP.

## 2021-05-02 ENCOUNTER — Other Ambulatory Visit: Payer: Self-pay | Admitting: Primary Care

## 2021-05-02 DIAGNOSIS — G4700 Insomnia, unspecified: Secondary | ICD-10-CM

## 2021-05-03 NOTE — Telephone Encounter (Signed)
Left message to return call to our office.  

## 2021-05-08 NOTE — Telephone Encounter (Signed)
Looks like patient called and made appointment for 3/16 is that ok?

## 2021-05-08 NOTE — Telephone Encounter (Signed)
I prefer to see her sooner but she was tough to get a hold of.  You can try, if not then we will see her in march.

## 2021-05-17 ENCOUNTER — Ambulatory Visit: Payer: 59 | Admitting: Podiatry

## 2021-05-17 ENCOUNTER — Other Ambulatory Visit: Payer: Self-pay

## 2021-05-17 ENCOUNTER — Ambulatory Visit (INDEPENDENT_AMBULATORY_CARE_PROVIDER_SITE_OTHER): Payer: 59

## 2021-05-17 ENCOUNTER — Encounter: Payer: Self-pay | Admitting: Podiatry

## 2021-05-17 DIAGNOSIS — M899 Disorder of bone, unspecified: Secondary | ICD-10-CM

## 2021-05-18 NOTE — Progress Notes (Signed)
Subjective:  Patient ID: Krista Carpenter, female    DOB: 10-09-1965,  MRN: [id number] HPI Chief Complaint  Patient presents with   Toe Pain    Hallux right - medial border, tender around nail x 1 year, shoes aggravate it   New Patient (Initial Visit)    Est pt 02/2019    56 y.o. female presents with the above complaint.   ROS: Denies fever chills nausea vomiting muscle aches pains calf pain back pain chest pain shortness of breath.  Past Medical History:  Diagnosis Date   Acute diverticulitis    Diverticula of colon    sigmoid and descending colon   Diverticulitis 11/04/2018   History of abnormal cervical Pap smear    IBS (irritable bowel syndrome)    Kidney infection 02/2020   Migraine    Past Surgical History:  Procedure Laterality Date   CHOLECYSTECTOMY     colonoscopy     TONSILLECTOMY     WRIST ARTHROCENTESIS      Current Outpatient Medications:    celecoxib (CELEBREX) 200 MG capsule, TAKE 1 CAPSULE BY MOUTH  TWICE DAILY, Disp: 180 capsule, Rfl: 3   COMBIPATCH 0.05-0.14 MG/DAY, Place 1 patch onto the skin 2 (two) times a week., Disp: , Rfl:    FLUoxetine (PROZAC) 40 MG capsule, TAKE 1 CAPSULE BY MOUTH DAILY FOR ANXIETY AND DEPRESSION. Office visit required for further refills., Disp: 30 capsule, Rfl: 0   losartan (COZAAR) 100 MG tablet, Take 1 tablet (100 mg total) by mouth daily. for blood pressure. Office visit required for further refills., Disp: 30 tablet, Rfl: 0   Phenyleph-CPM-DM-APAP (ALKA-SELTZER PLUS COLD & COUGH) 08-22-08-325 MG CAPS, Take 2 capsules by mouth as needed., Disp: , Rfl:    PREMARIN vaginal cream, Insert 0.5 applicatorful every day by vaginal route for two weeks then to twice a week, Disp: , Rfl:    rizatriptan (MAXALT) 10 MG tablet, Take 1 tablet (10 mg total) by mouth as needed for migraine. Take 1 tablet by mouth at migraine onset. May repeat in 2 hours if needed. Do not exceed 2 tablets in 24 hours., Disp: 10 tablet, Rfl: 0   traZODone  (DESYREL) 50 MG tablet, Take 1.5 tablets (75 mg total) by mouth at bedtime. For sleep. Office visit required for further refills., Disp: 45 tablet, Rfl: 0  No Known Allergies Review of Systems Objective:  There were no vitals filed for this visit.  General: Well developed, nourished, in no acute distress, alert and oriented x3   Dermatological: Skin is warm, dry and supple bilateral. Nails x 10 are well maintained; remaining integument appears unremarkable at this time. There are no open sores, no preulcerative lesions, no rash or signs of infection present.  There is no erythema edema cellulitis drainage or odor to the right hallux.  There is some sharp incurvation of the nail along the medial one third but is not growing into the skin.  Vascular: Dorsalis Pedis artery and Posterior Tibial artery pedal pulses are 2/4 bilateral with immedate capillary fill time. Pedal hair growth present. No varicosities and no lower extremity edema present bilateral.   Neruologic: Grossly intact via light touch bilateral. Vibratory intact via tuning fork bilateral. Protective threshold with Semmes Wienstein monofilament intact to all pedal sites bilateral. Patellar and Achilles deep tendon reflexes 2+ bilateral. No Babinski or clonus noted bilateral.   Musculoskeletal: No gross boney pedal deformities bilateral. No pain, crepitus, or limitation noted with foot and ankle range of motion bilateral. Muscular  strength 5/5 in all groups tested bilateral.  She has severe pain on palpation of the dorsum of the nail.  Gait: Unassisted, Nonantalgic.    Radiographs:  Radiographs taken today demonstrate an osseously mature individual.  Elevated hallux does not demonstrate anything dorsally on lateral view however the AP and oblique view demonstrate a slight exostosis to the dorsal dorsal medial aspect of the distal phalanx.  This is beneath where her overlying pain is.  Assessment & Plan:   Assessment: Exostosis  subungual hallux.  Plan: Discussed etiology pathology and surgical therapies at this point consented her for removal of nail hallux right temporary nature with exostectomy at the surgery center.  We did discuss the possible postop complications which may include but not limited to postop pain bleeding swelling infection recurrence need for further surgery and bad appearance of her toenail.     Krista Carpenter, [place]

## 2021-05-19 ENCOUNTER — Telehealth: Payer: Self-pay | Admitting: Primary Care

## 2021-05-19 DIAGNOSIS — I10 Essential (primary) hypertension: Secondary | ICD-10-CM

## 2021-05-19 NOTE — Telephone Encounter (Signed)
She really needs to be seen much sooner than scheduled. Can we get her in in February?

## 2021-05-22 NOTE — Telephone Encounter (Signed)
Called pt and got her rescheduled for 2/23 @11 

## 2021-05-29 ENCOUNTER — Other Ambulatory Visit: Payer: Self-pay | Admitting: Primary Care

## 2021-05-29 DIAGNOSIS — F4323 Adjustment disorder with mixed anxiety and depressed mood: Secondary | ICD-10-CM

## 2021-05-29 DIAGNOSIS — G4700 Insomnia, unspecified: Secondary | ICD-10-CM

## 2021-06-15 ENCOUNTER — Encounter: Payer: 59 | Admitting: Primary Care

## 2021-06-16 ENCOUNTER — Telehealth: Payer: Self-pay | Admitting: Urology

## 2021-06-16 NOTE — Telephone Encounter (Signed)
DOS - 07/14/21   EXOSTECTOMY 1ST RIGHT --- [address]  [facility] EFFECTIVE DATE - 04/23/21  PLAN DEDUCTIBLE - $500.00 W/ $490.45 REMAINING OUT OF POCKET - $3,000.00 W/ [id number] REMAINING COINSURANCE - 20% COPAY - $0.00  PER [employer] WEBSITE FOR CPT CODE 42595 Notification or Prior Authorization is not required for the requested services  This [employer] plan does not currently require a prior authorization for these services. If you have general questions about the prior authorization requirements, please call us at [phone] or visit [email or web address] > Clinician Resources > Advance and Admission Notification Requirements. The number above acknowledges your notification. Please write this number down for future reference. Notification is not a guarantee of coverage or payment.  Decision ID [id number]

## 2021-06-20 ENCOUNTER — Other Ambulatory Visit: Payer: Self-pay | Admitting: Primary Care

## 2021-06-20 DIAGNOSIS — I10 Essential (primary) hypertension: Secondary | ICD-10-CM

## 2021-06-21 ENCOUNTER — Other Ambulatory Visit: Payer: Self-pay | Admitting: Primary Care

## 2021-06-21 DIAGNOSIS — F4323 Adjustment disorder with mixed anxiety and depressed mood: Secondary | ICD-10-CM

## 2021-06-21 DIAGNOSIS — G4700 Insomnia, unspecified: Secondary | ICD-10-CM

## 2021-06-22 ENCOUNTER — Encounter: Payer: 59 | Admitting: Primary Care

## 2021-06-29 ENCOUNTER — Other Ambulatory Visit: Payer: Self-pay

## 2021-06-29 ENCOUNTER — Encounter: Payer: Self-pay | Admitting: Primary Care

## 2021-06-29 ENCOUNTER — Ambulatory Visit (INDEPENDENT_AMBULATORY_CARE_PROVIDER_SITE_OTHER): Payer: 59 | Admitting: Primary Care

## 2021-06-29 VITALS — BP 130/82 | HR 77 | Temp 98.5°F | Resp 14 | Ht 60.0 in | Wt 149.6 lb

## 2021-06-29 DIAGNOSIS — R7303 Prediabetes: Secondary | ICD-10-CM | POA: Diagnosis not present

## 2021-06-29 DIAGNOSIS — I10 Essential (primary) hypertension: Secondary | ICD-10-CM

## 2021-06-29 DIAGNOSIS — Z0001 Encounter for general adult medical examination with abnormal findings: Secondary | ICD-10-CM

## 2021-06-29 DIAGNOSIS — E785 Hyperlipidemia, unspecified: Secondary | ICD-10-CM | POA: Diagnosis not present

## 2021-06-29 DIAGNOSIS — F4323 Adjustment disorder with mixed anxiety and depressed mood: Secondary | ICD-10-CM

## 2021-06-29 DIAGNOSIS — G43709 Chronic migraine without aura, not intractable, without status migrainosus: Secondary | ICD-10-CM

## 2021-06-29 DIAGNOSIS — Z23 Encounter for immunization: Secondary | ICD-10-CM | POA: Diagnosis not present

## 2021-06-29 DIAGNOSIS — G4700 Insomnia, unspecified: Secondary | ICD-10-CM

## 2021-06-29 MED ORDER — UBRELVY 50 MG PO TABS: ORAL_TABLET | ORAL | 0 refills | Status: DC

## 2021-06-29 MED ORDER — TRAZODONE HCL 100 MG PO TABS: 100.0000 mg | ORAL_TABLET | Freq: Every day | ORAL | 3 refills | Status: DC

## 2021-06-29 MED ORDER — PROPRANOLOL HCL ER 80 MG PO CP24: 80.0000 mg | ORAL_CAPSULE | Freq: Every day | ORAL | 0 refills | Status: DC

## 2021-06-29 NOTE — Assessment & Plan Note (Signed)
First Shingrix vaccine provided today.  Other vaccines up-to-date. ? ?Pap smear mammogram up-to-date, follows with GYN. ?Colonoscopy up-to-date, due 2025. ? ?Discussed the importance of a healthy diet and regular exercise in order for weight loss, and to reduce the risk of further co-morbidity. ? ?Exam today as noted. ?Labs pending. ?

## 2021-06-29 NOTE — Assessment & Plan Note (Signed)
Not currently on treatment. ? ?Repeat lipid panel pending. ?

## 2021-06-29 NOTE — Assessment & Plan Note (Signed)
Uncontrolled. ? ?Increase trazodone to 100 mg nightly. ?Prescription sent to pharmacy ?

## 2021-06-29 NOTE — Assessment & Plan Note (Signed)
Discussed the importance of a healthy diet and regular exercise in order for weight loss, and to reduce the risk of further co-morbidity.  Repeat A1c pending. 

## 2021-06-29 NOTE — Progress Notes (Signed)
? ?Subjective:  ? ? Patient ID: Krista Carpenter, female    DOB: Nov 30, 1965, 56 y.o.   MRN: [id number] ? ?HPI ? ?Krista Carpenter is a very pleasant 56 y.o. female who presents today for complete physical and follow up of chronic conditions. ? ?Chronic migraines, occurring 1-2 times monthly, especially during weather changes. Mostly located to frontal and temporal lobes. Also with photophobia and sensitivity to smell. She does have fatigue and burning senation with use of Maxalt. She's tried Imitrex which caused the same reaction. Headaches occurs daily and are located to the frontal and temporal lobes, last for hours to days.  ? ?She was once treated with Topamax for frequent headaches, unsure of the dose, still remembered having headaches despite Topamax. Unsure of the dose.  ? ?Immunizations: ?-Tetanus: 2020 ?-Influenza: Completed the season ?-Covid-19: 3 vaccines ?-Shingles: Never completed  ? ?Diet: Fair diet. Intermittent fasting.  ?Exercise: No regular exercise. ? ?Eye exam: Completes annually  ?Dental exam: Completes semi-annually  ? ?Pap Smear: Completes with GYN ?Mammogram: Completes annually with GYN ?Colonoscopy: Completed in 2020, due 2025 ? ? ?BP Readings from Last 3 Encounters:  ?06/29/21 130/82  ?04/11/21 130/62  ?03/11/20 130/78  ? ?Wt Readings from Last 3 Encounters:  ?06/29/21 149 lb 9.6 oz (67.9 kg)  ?04/11/21 152 lb (68.9 kg)  ?03/11/20 142 lb (64.4 kg)  ? ? ? ? ? ?Review of Systems  ?Constitutional:  Negative for unexpected weight change.  ?HENT:  Negative for rhinorrhea.   ?Eyes:  Negative for visual disturbance.  ?Respiratory:  Negative for cough and shortness of breath.   ?Cardiovascular:  Negative for chest pain.  ?Gastrointestinal:  Negative for constipation and diarrhea.  ?Genitourinary:  Negative for difficulty urinating.  ?Musculoskeletal:  Negative for arthralgias and myalgias.  ?Skin:  Negative for rash.  ?Allergic/Immunologic: Negative for environmental allergies.  ?Neurological:   Positive for headaches. Negative for dizziness.  ?Psychiatric/Behavioral:  The patient is not nervous/anxious.   ? ?   ? ? ?Past Medical History:  ?Diagnosis Date  ? Acute diverticulitis   ? Diverticula of colon   ? sigmoid and descending colon  ? Diverticulitis 11/04/2018  ? History of abnormal cervical Pap smear   ? IBS (irritable bowel syndrome)   ? Kidney infection 02/2020  ? Migraine   ? ? ?Social History  ? ?Socioeconomic History  ? Marital status: Married  ?  Spouse name: Not on file  ? Number of children: Not on file  ? Years of education: Not on file  ? Highest education level: Not on file  ?Occupational History  ? Not on file  ?Tobacco Use  ? Smoking status: Never  ? Smokeless tobacco: Never  ?Vaping Use  ? Vaping Use: Never used  ?Substance and Sexual Activity  ? Alcohol use: Yes  ?  Alcohol/week: 0.0 standard drinks  ?  Comment: occasion  ? Drug use: No  ? Sexual activity: Yes  ?  Partners: Male  ?  Birth control/protection: I.U.D.  ?Other Topics Concern  ? Not on file  ?Social History Narrative  ? Married.  ? 2 children.  ? Works at [employer].  ? Enjoys scrapbooking, reading.  ? ?Social Determinants of Health  ? ?Financial Resource Strain: Not on file  ?Food Insecurity: Not on file  ?Transportation Needs: Not on file  ?Physical Activity: Not on file  ?Stress: Not on file  ?Social Connections: Not on file  ?Intimate Partner Violence: Not on file  ? ? ?Past Surgical  History:  ?Procedure Laterality Date  ? CHOLECYSTECTOMY    ? colonoscopy    ? TONSILLECTOMY    ? WRIST ARTHROCENTESIS    ? ? ?Family History  ?Problem Relation Age of Onset  ? Cancer Mother   ?     skin  ? Diabetes Father   ? Diabetes Brother   ? ? ?No Known Allergies ? ?Current Outpatient Medications on File Prior to Visit  ?Medication Sig Dispense Refill  ? COMBIPATCH 0.05-0.14 MG/DAY Place 1 patch onto the skin 2 (two) times a week.    ? FLUoxetine (PROZAC) 40 MG capsule TAKE 1 CAPSULE BY MOUTH DAILY  FOR ANXIETY AND DEPRESSION.  OFFICE  VISIT REQUIRED FOR  FURTHER REFILLS 30 capsule 0  ? losartan (COZAAR) 100 MG tablet Take 1 tablet (100 mg total) by mouth daily. For blood pressure. Office visit required for further refills. 30 tablet 0  ? Phenyleph-CPM-DM-APAP (ALKA-SELTZER PLUS COLD & COUGH) 08-22-08-325 MG CAPS Take 2 capsules by mouth as needed.    ? PREMARIN vaginal cream Insert 0.5 applicatorful every day by vaginal route for two weeks then to twice a week    ? traZODone (DESYREL) 50 MG tablet Take 1.5 tablets (75 mg total) by mouth at bedtime. For sleep. Office visit required for further refills. 45 tablet 0  ? celecoxib (CELEBREX) 200 MG capsule TAKE 1 CAPSULE BY MOUTH  TWICE DAILY (Patient not taking: Reported on 06/29/2021) 180 capsule 3  ? rizatriptan (MAXALT) 10 MG tablet Take 1 tablet (10 mg total) by mouth as needed for migraine. Take 1 tablet by mouth at migraine onset. May repeat in 2 hours if needed. Do not exceed 2 tablets in 24 hours. (Patient not taking: Reported on 06/29/2021) 10 tablet 0  ? ?No current facility-administered medications on file prior to visit.  ? ? ?BP 130/82 (BP Location: Left Arm, Patient Position: Sitting, Cuff Size: Normal)   Pulse 77   Temp 98.5 ?F (36.9 ?C) (Oral)   Resp 14   Ht 5' (1.524 m)   Wt 149 lb 9.6 oz (67.9 kg)   LMP 12/02/2012   SpO2 98%   BMI 29.22 kg/m?  ?Objective:  ? Physical Exam ?HENT:  ?   Right Ear: Tympanic membrane and ear canal normal.  ?   Left Ear: Tympanic membrane and ear canal normal.  ?   Nose: Nose normal.  ?Eyes:  ?   Conjunctiva/sclera: Conjunctivae normal.  ?   Pupils: Pupils are equal, round, and reactive to light.  ?Neck:  ?   Thyroid: No thyromegaly.  ?Cardiovascular:  ?   Rate and Rhythm: Normal rate and regular rhythm.  ?   Heart sounds: No murmur heard. ?Pulmonary:  ?   Effort: Pulmonary effort is normal.  ?   Breath sounds: Normal breath sounds. No rales.  ?Abdominal:  ?   General: Bowel sounds are normal.  ?   Palpations: Abdomen is soft.  ?   Tenderness: There is  no abdominal tenderness.  ?Musculoskeletal:     ?   General: Normal range of motion.  ?   Cervical back: Neck supple.  ?Lymphadenopathy:  ?   Cervical: No cervical adenopathy.  ?Skin: ?   General: Skin is warm and dry.  ?   Findings: No rash.  ?Neurological:  ?   Mental Status: She is alert and oriented to person, place, and time.  ?   Cranial Nerves: No cranial nerve deficit.  ?   Deep Tendon Reflexes: Reflexes  are normal and symmetric.  ?Psychiatric:     ?   Mood and Affect: Mood normal.  ? ? ? ? ? ?   ?Assessment & Plan:  ? ? ? ? ?This visit occurred during the SARS-CoV-2 public health emergency.  Safety protocols were in place, including screening questions prior to the visit, additional usage of staff PPE, and extensive cleaning of exam room while observing appropriate contact time as indicated for disinfecting solutions.  ?

## 2021-06-29 NOTE — Assessment & Plan Note (Signed)
Controlled.  Continue losartan 100 mg daily. CMP pending.  

## 2021-06-29 NOTE — Patient Instructions (Signed)
Start propanolol ER 80 mg once daily for headache prevention. ? ?You may take Ubrelvy 50 mg tablets for migraine abortion.  Take 1 tablet by mouth at migraine onset, may repeat with 1 tablet 2 hours later if migraine persists. ? ?Stop by the lab prior to leaving today. I will notify you of your results once received.  ? ?Please update me in a few weeks regarding your migraines. ? ?It was a pleasure to see you today! ? ?Preventive Care 62-55 Years Old, Female ?Preventive care refers to lifestyle choices and visits with your health care provider that can promote health and wellness. Preventive care visits are also called wellness exams. ?What can I expect for my preventive care visit? ?Counseling ?Your health care provider may ask you questions about your: ?Medical history, including: ?Past medical problems. ?Family medical history. ?Pregnancy history. ?Current health, including: ?Menstrual cycle. ?Method of birth control. ?Emotional well-being. ?Home life and relationship well-being. ?Sexual activity and sexual health. ?Lifestyle, including: ?Alcohol, nicotine or tobacco, and drug use. ?Access to firearms. ?Diet, exercise, and sleep habits. ?Work and work Statistician. ?Sunscreen use. ?Safety issues such as seatbelt and bike helmet use. ?Physical exam ?Your health care provider will check your: ?Height and weight. These may be used to calculate your BMI (body mass index). BMI is a measurement that tells if you are at a healthy weight. ?Waist circumference. This measures the distance around your waistline. This measurement also tells if you are at a healthy weight and may help predict your risk of certain diseases, such as type 2 diabetes and high blood pressure. ?Heart rate and blood pressure. ?Body temperature. ?Skin for abnormal spots. ?What immunizations do I need? ?Vaccines are usually given at various ages, according to a schedule. Your health care provider will recommend vaccines for you based on your age,  medical history, and lifestyle or other factors, such as travel or where you work. ?What tests do I need? ?Screening ?Your health care provider may recommend screening tests for certain conditions. This may include: ?Lipid and cholesterol levels. ?Diabetes screening. This is done by checking your blood sugar (glucose) after you have not eaten for a while (fasting). ?Pelvic exam and Pap test. ?Hepatitis B test. ?Hepatitis C test. ?HIV (human immunodeficiency virus) test. ?STI (sexually transmitted infection) testing, if you are at risk. ?Lung cancer screening. ?Colorectal cancer screening. ?Mammogram. Talk with your health care provider about when you should start having regular mammograms. This may depend on whether you have a family history of breast cancer. ?BRCA-related cancer screening. This may be done if you have a family history of breast, ovarian, tubal, or peritoneal cancers. ?Bone density scan. This is done to screen for osteoporosis. ?Talk with your health care provider about your test results, treatment options, and if necessary, the need for more tests. ?Follow these instructions at home: ?Eating and drinking ? ?Eat a diet that includes fresh fruits and vegetables, whole grains, lean protein, and low-fat dairy products. ?Take vitamin and mineral supplements as recommended by your health care provider. ?Do not drink alcohol if: ?Your health care provider tells you not to drink. ?You are pregnant, may be pregnant, or are planning to become pregnant. ?If you drink alcohol: ?Limit how much you have to 0-1 drink a day. ?Know how much alcohol is in your drink. In the [place] one drink equals one 12 oz bottle of beer (355 mL), one 5 oz glass of wine (148 mL), or one 1? oz glass of hard liquor (44 mL). ?Lifestyle ?  Brush your teeth every morning and night with fluoride toothpaste. Floss one time each day. ?Exercise for at least 30 minutes 5 or more days each week. ?Do not use any products that contain nicotine or  tobacco. These products include cigarettes, chewing tobacco, and vaping devices, such as e-cigarettes. If you need help quitting, ask your health care provider. ?Do not use drugs. ?If you are sexually active, practice safe sex. Use a condom or other form of protection to prevent STIs. ?If you do not wish to become pregnant, use a form of birth control. If you plan to become pregnant, see your health care provider for a prepregnancy visit. ?Take aspirin only as told by your health care provider. Make sure that you understand how much to take and what form to take. Work with your health care provider to find out whether it is safe and beneficial for you to take aspirin daily. ?Find healthy ways to manage stress, such as: ?Meditation, yoga, or listening to music. ?Journaling. ?Talking to a trusted person. ?Spending time with friends and family. ?Minimize exposure to UV radiation to reduce your risk of skin cancer. ?Safety ?Always wear your seat belt while driving or riding in a vehicle. ?Do not drive: ?If you have been drinking alcohol. Do not ride with someone who has been drinking. ?When you are tired or distracted. ?While texting. ?If you have been using any mind-altering substances or drugs. ?Wear a helmet and other protective equipment during sports activities. ?If you have firearms in your house, make sure you follow all gun safety procedures. ?Seek help if you have been physically or sexually abused. ?What's next? ?Visit your health care provider once a year for an annual wellness visit. ?Ask your health care provider how often you should have your eyes and teeth checked. ?Stay up to date on all vaccines. ?This information is not intended to replace advice given to you by your health care provider. Make sure you discuss any questions you have with your health care provider. ?Document Revised: 10/05/2020 Document Reviewed: 10/05/2020 ?Elsevier Patient Education ? [place]. ? ?

## 2021-06-29 NOTE — Assessment & Plan Note (Signed)
Controlled.  ?Continue fluoxetine 40 mg daily. ?

## 2021-06-29 NOTE — Assessment & Plan Note (Signed)
Chronic and uncontrolled. ?She needs both preventative and effective abortive treatment. ? ?Discontinued Maxalt, previously failed Imitrex. ?Rx for Ubrelvy sent to pharmacy for abortive treatment. ? ?Rx for propanolol ER 80 mg daily sent to pharmacy for headache prevention treatment. ? ?She will update in a few weeks. ?

## 2021-06-30 LAB — COMPREHENSIVE METABOLIC PANEL
ALT: 31 U/L (ref 0–35)
AST: 68 U/L — ABNORMAL HIGH (ref 0–37)
Albumin: 4.8 g/dL (ref 3.5–5.2)
Alkaline Phosphatase: 35 U/L — ABNORMAL LOW (ref 39–117)
BUN: 17 mg/dL (ref 6–23)
CO2: 28 mEq/L (ref 19–32)
Calcium: 9.5 mg/dL (ref 8.4–10.5)
Chloride: 102 mEq/L (ref 96–112)
Creatinine, Ser: 1.03 mg/dL (ref 0.40–1.20)
GFR: 61.7 mL/min (ref >60.00)
Glucose, Bld: 78 mg/dL (ref 70–99)
Potassium: 4.2 mEq/L (ref 3.5–5.1)
Sodium: 138 mEq/L (ref 135–145)
Total Bilirubin: 0.4 mg/dL (ref 0.2–1.2)
Total Protein: 7.1 g/dL (ref 6.0–8.3)

## 2021-06-30 LAB — CBC
HCT: 39.1 % (ref 36.0–46.0)
Hemoglobin: 13.2 g/dL (ref 12.0–15.0)
MCHC: 33.7 g/dL (ref 30.0–36.0)
MCV: 89.1 fl (ref 78.0–100.0)
Platelets: 323 10*3/uL (ref 150.0–400.0)
RBC: 4.39 Mil/uL (ref 3.87–5.11)
RDW: 13.7 % (ref 11.5–15.5)
WBC: 8.8 10*3/uL (ref 4.0–10.5)

## 2021-06-30 LAB — HEMOGLOBIN A1C: Hgb A1c MFr Bld: 5.8 % (ref 4.6–6.5)

## 2021-06-30 LAB — LIPID PANEL
Cholesterol: 217 mg/dL — ABNORMAL HIGH (ref 0–200)
HDL: 45.6 mg/dL (ref >39.00)
LDL Cholesterol: 134 mg/dL — ABNORMAL HIGH (ref 0–99)
NonHDL: 171.29
Total CHOL/HDL Ratio: 5
Triglycerides: 186 mg/dL — ABNORMAL HIGH (ref 0.0–149.0)
VLDL: 37.2 mg/dL (ref 0.0–40.0)

## 2021-07-06 ENCOUNTER — Encounter: Payer: 59 | Admitting: Primary Care

## 2021-07-12 ENCOUNTER — Other Ambulatory Visit: Payer: Self-pay | Admitting: Podiatry

## 2021-07-12 LAB — HM MAMMOGRAPHY

## 2021-07-12 LAB — HM DEXA SCAN

## 2021-07-12 MED ORDER — CEPHALEXIN 500 MG PO CAPS: 500.0000 mg | ORAL_CAPSULE | Freq: Three times a day (TID) | ORAL | 0 refills | Status: DC

## 2021-07-12 MED ORDER — ONDANSETRON HCL 4 MG PO TABS: 4.0000 mg | ORAL_TABLET | Freq: Three times a day (TID) | ORAL | 0 refills | Status: DC | PRN

## 2021-07-12 MED ORDER — OXYCODONE-ACETAMINOPHEN 10-325 MG PO TABS: 1.0000 | ORAL_TABLET | Freq: Three times a day (TID) | ORAL | 0 refills | Status: DC | PRN

## 2021-07-13 ENCOUNTER — Other Ambulatory Visit: Payer: Self-pay | Admitting: Primary Care

## 2021-07-13 DIAGNOSIS — G4700 Insomnia, unspecified: Secondary | ICD-10-CM

## 2021-07-13 DIAGNOSIS — I10 Essential (primary) hypertension: Secondary | ICD-10-CM

## 2021-07-14 DIAGNOSIS — M25774 Osteophyte, right foot: Secondary | ICD-10-CM | POA: Diagnosis not present

## 2021-07-19 ENCOUNTER — Ambulatory Visit (INDEPENDENT_AMBULATORY_CARE_PROVIDER_SITE_OTHER): Payer: 59

## 2021-07-19 ENCOUNTER — Ambulatory Visit (INDEPENDENT_AMBULATORY_CARE_PROVIDER_SITE_OTHER): Payer: 59 | Admitting: Podiatry

## 2021-07-19 DIAGNOSIS — M899 Disorder of bone, unspecified: Secondary | ICD-10-CM | POA: Diagnosis not present

## 2021-07-19 DIAGNOSIS — Z9889 Other specified postprocedural states: Secondary | ICD-10-CM

## 2021-07-19 NOTE — Progress Notes (Signed)
She presents today for postop visit date of surgery 07/14/2021 medial subungual exostectomy first right.  Denies fever chills nausea vomiting muscle aches pains calf pain back pain chest pain shortness of breath.  Does relate that the Percocet made her itch. ? ?Objective: Vital signs are stable alert and oriented x3 sterile dressing was removed demonstrates no erythema edema cellulitis drainage or odor to the right hallux.  Sutures are intact margins well coapted radiographs taken today demonstrate complete resection of the bone spur. ? ?Assessment: Well-healing surgical toe. ? ?Plan: Redressed the toe dressed a compressive dressing today she will keep this on a leave it dry and clean for me I will follow-up with her in 2 weeks ?

## 2021-07-27 ENCOUNTER — Other Ambulatory Visit: Payer: Self-pay | Admitting: Primary Care

## 2021-07-27 DIAGNOSIS — G4700 Insomnia, unspecified: Secondary | ICD-10-CM

## 2021-07-27 DIAGNOSIS — F4323 Adjustment disorder with mixed anxiety and depressed mood: Secondary | ICD-10-CM

## 2021-08-02 ENCOUNTER — Ambulatory Visit (INDEPENDENT_AMBULATORY_CARE_PROVIDER_SITE_OTHER): Payer: 59 | Admitting: Podiatry

## 2021-08-02 DIAGNOSIS — Z9889 Other specified postprocedural states: Secondary | ICD-10-CM

## 2021-08-02 DIAGNOSIS — M899 Disorder of bone, unspecified: Secondary | ICD-10-CM

## 2021-08-02 NOTE — Progress Notes (Signed)
She presents today for postop visit date of surgery 07/14/2021 medial subungual exostectomy.  She denies fever chills nausea vomiting muscle aches pains calf pain states that she has been having sharp shooting pains at nighttime and sometimes waking her up early in the mornings and with throbbing. ? ?Objective: Dressed her dressing was removed demonstrates no erythema edema cellulitis drainage or odor.  Sutures intact margins well coapted to the hallux right.  Sutures removed today margins remain well coapted. ? ?Assessment: Well-healing surgical toe. ? ?Plan: I will allow her to start soaking this and washing it regularly.  I like to follow-up with her in 2 to 4 weeks. ?

## 2021-08-04 ENCOUNTER — Other Ambulatory Visit: Payer: Self-pay | Admitting: Primary Care

## 2021-08-04 DIAGNOSIS — G4700 Insomnia, unspecified: Secondary | ICD-10-CM

## 2021-08-19 ENCOUNTER — Other Ambulatory Visit: Payer: Self-pay

## 2021-08-19 DIAGNOSIS — G43709 Chronic migraine without aura, not intractable, without status migrainosus: Secondary | ICD-10-CM

## 2021-08-19 NOTE — Telephone Encounter (Signed)
Received request for 90 day supply from mail order.  ?

## 2021-08-21 NOTE — Telephone Encounter (Signed)
How are her headaches since we started propranolol Er 80 mg daily? ? ?Is this helping? ?

## 2021-08-29 ENCOUNTER — Ambulatory Visit: Payer: 59

## 2021-09-06 ENCOUNTER — Ambulatory Visit (INDEPENDENT_AMBULATORY_CARE_PROVIDER_SITE_OTHER): Payer: 59 | Admitting: Podiatry

## 2021-09-06 DIAGNOSIS — M899 Disorder of bone, unspecified: Secondary | ICD-10-CM

## 2021-09-06 DIAGNOSIS — Z9889 Other specified postprocedural states: Secondary | ICD-10-CM

## 2021-09-06 NOTE — Progress Notes (Signed)
She presents today for her postop visit date of surgery 07/15/2018 for medial subungual exostectomy hallux right.  States is still little sore with certain shoes. ? ?Objective: Vital signs are stable she is alert and oriented x3.  Pulses are palpable.  She still has some mild erythema to the proximal incision site of that hallux.  It appears to be mostly scar related.  She does have some radiating pain with palpation. ? 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 ? ?Plan: Discussed etiology pathology and surgical therapies did discuss the use contrast baths also discussed the use of anti-inflammatory cream and massage therapy.  Also recommended that she continue with her Mederma cream.  Follow-up with her in 6 weeks ?

## 2021-09-11 NOTE — Telephone Encounter (Signed)
We can try a dose increase to 120 mg daily to see if this helps prevent headaches. Does she want to try this? If so then I will send to her pharmacy, make sure she updates me in 1 month.

## 2021-09-11 NOTE — Telephone Encounter (Signed)
Patient has noticed very little improvement. She was not able to get the ubrelvy due to cost. She had 3-4 headaches last week with one that lasted more than 24 hrs.

## 2021-09-19 ENCOUNTER — Ambulatory Visit: Payer: 59

## 2021-09-21 ENCOUNTER — Ambulatory Visit (INDEPENDENT_AMBULATORY_CARE_PROVIDER_SITE_OTHER): Payer: 59

## 2021-09-21 DIAGNOSIS — Z23 Encounter for immunization: Secondary | ICD-10-CM | POA: Diagnosis not present

## 2021-09-25 NOTE — Telephone Encounter (Signed)
Mailbox full

## 2021-10-03 ENCOUNTER — Other Ambulatory Visit: Payer: Self-pay | Admitting: Primary Care

## 2021-10-03 DIAGNOSIS — G43709 Chronic migraine without aura, not intractable, without status migrainosus: Secondary | ICD-10-CM

## 2021-10-11 NOTE — Telephone Encounter (Signed)
Mailbox full

## 2021-10-12 NOTE — Telephone Encounter (Signed)
Called patient x 3 mail box full. My chart sent to patient to call office.

## 2021-10-18 ENCOUNTER — Encounter: Payer: 59 | Admitting: Podiatry

## 2021-10-23 ENCOUNTER — Other Ambulatory Visit: Payer: Self-pay | Admitting: Primary Care

## 2021-10-23 ENCOUNTER — Encounter (INDEPENDENT_AMBULATORY_CARE_PROVIDER_SITE_OTHER): Payer: 59

## 2021-10-23 DIAGNOSIS — G43709 Chronic migraine without aura, not intractable, without status migrainosus: Secondary | ICD-10-CM

## 2021-10-23 MED ORDER — PROPRANOLOL HCL ER 80 MG PO CP24: 80.0000 mg | ORAL_CAPSULE | Freq: Every day | ORAL | 0 refills | Status: DC

## 2021-10-26 ENCOUNTER — Other Ambulatory Visit: Payer: Self-pay | Admitting: Primary Care

## 2021-10-26 DIAGNOSIS — G4700 Insomnia, unspecified: Secondary | ICD-10-CM

## 2021-10-26 MED ORDER — ELETRIPTAN HYDROBROMIDE 20 MG PO TABS: ORAL_TABLET | ORAL | 0 refills | Status: DC

## 2021-10-26 NOTE — Telephone Encounter (Signed)
Please see the MyChart message reply(ies) for my assessment and plan.  The patient gave consent for this Medical Advice Message and is aware that it may result in a bill to their insurance company as well as the possibility that this may result in a co-payment or deductible. They are an established patient, but are not seeking medical advice exclusively about a problem treated during an in person or video visit in the last 7 days. I did not recommend an in person or video visit within 7 days of my reply.  I spent a total of 15 minutes cumulative time within 7 days through MyChart messaging Tanica Gaige K Kristofer Schaffert, NP  

## 2021-11-27 ENCOUNTER — Ambulatory Visit: Payer: 59 | Admitting: Family

## 2021-11-27 ENCOUNTER — Other Ambulatory Visit: Payer: Self-pay

## 2021-11-27 ENCOUNTER — Encounter: Payer: Self-pay | Admitting: Family

## 2021-11-27 ENCOUNTER — Emergency Department
Admission: EM | Admit: 2021-11-27 | Discharge: 2021-11-27 | Disposition: A | Discharge status: Home / Self Care | Payer: 59 | Attending: Emergency Medicine | Admitting: Emergency Medicine

## 2021-11-27 ENCOUNTER — Encounter: Payer: Self-pay | Admitting: Emergency Medicine

## 2021-11-27 ENCOUNTER — Emergency Department: Payer: 59

## 2021-11-27 VITALS — BP 128/74 | HR 67 | Temp 98.3°F | Resp 16 | Ht 60.0 in | Wt 150.1 lb

## 2021-11-27 DIAGNOSIS — R1031 Right lower quadrant pain: Secondary | ICD-10-CM | POA: Diagnosis not present

## 2021-11-27 DIAGNOSIS — K5732 Diverticulitis of large intestine without perforation or abscess without bleeding: Secondary | ICD-10-CM | POA: Diagnosis not present

## 2021-11-27 DIAGNOSIS — R1032 Left lower quadrant pain: Secondary | ICD-10-CM | POA: Diagnosis not present

## 2021-11-27 DIAGNOSIS — M545 Low back pain, unspecified: Secondary | ICD-10-CM

## 2021-11-27 DIAGNOSIS — K5730 Diverticulosis of large intestine without perforation or abscess without bleeding: Secondary | ICD-10-CM | POA: Insufficient documentation

## 2021-11-27 DIAGNOSIS — K5792 Diverticulitis of intestine, part unspecified, without perforation or abscess without bleeding: Secondary | ICD-10-CM

## 2021-11-27 LAB — COMPREHENSIVE METABOLIC PANEL
ALT: 45 U/L — ABNORMAL HIGH (ref 0–44)
AST: 30 U/L (ref 15–41)
Albumin: 4.4 g/dL (ref 3.5–5.0)
Alkaline Phosphatase: 50 U/L (ref 38–126)
Anion gap: 8 (ref 5–15)
BUN: 17 mg/dL (ref 6–20)
CO2: 24 mmol/L (ref 22–32)
Calcium: 9.2 mg/dL (ref 8.9–10.3)
Chloride: 105 mmol/L (ref 98–111)
Creatinine, Ser: 0.94 mg/dL (ref 0.44–1.00)
GFR, Estimated: >60 mL/min (ref >60)
Glucose, Bld: 101 mg/dL — ABNORMAL HIGH (ref 70–99)
Potassium: 4.1 mmol/L (ref 3.5–5.1)
Sodium: 137 mmol/L (ref 135–145)
Total Bilirubin: 0.9 mg/dL (ref 0.3–1.2)
Total Protein: 7.7 g/dL (ref 6.5–8.1)

## 2021-11-27 LAB — URINALYSIS, ROUTINE W REFLEX MICROSCOPIC
Bilirubin Urine: NEGATIVE
Glucose, UA: NEGATIVE mg/dL
Ketones, ur: NEGATIVE mg/dL
Leukocytes,Ua: NEGATIVE
Nitrite: NEGATIVE
Protein, ur: NEGATIVE mg/dL
Specific Gravity, Urine: 1.002 — ABNORMAL LOW (ref 1.005–1.030)
pH: 7 (ref 5.0–8.0)

## 2021-11-27 LAB — CBC
HCT: 41.3 % (ref 36.0–46.0)
Hemoglobin: 13.3 g/dL (ref 12.0–15.0)
MCH: 29.2 pg (ref 26.0–34.0)
MCHC: 32.2 g/dL (ref 30.0–36.0)
MCV: 90.8 fL (ref 80.0–100.0)
Platelets: 277 10*3/uL (ref 150–400)
RBC: 4.55 MIL/uL (ref 3.87–5.11)
RDW: 12.7 % (ref 11.5–15.5)
WBC: 10.7 10*3/uL — ABNORMAL HIGH (ref 4.0–10.5)
nRBC: 0 % (ref 0.0–0.2)

## 2021-11-27 LAB — LIPASE, BLOOD: Lipase: 44 U/L (ref 11–51)

## 2021-11-27 MED ORDER — HYDROCODONE-ACETAMINOPHEN 5-325 MG PO TABS: 1.0000 | ORAL_TABLET | ORAL | 0 refills | Status: DC | PRN

## 2021-11-27 MED ORDER — HYDROCODONE-ACETAMINOPHEN 5-325 MG PO TABS
1.0000 | ORAL_TABLET | Freq: Once | ORAL | Status: AC
  Administered 2021-11-27: 1 via ORAL
  Filled 2021-11-27: qty 1

## 2021-11-27 MED ORDER — AMOXICILLIN-POT CLAVULANATE 875-125 MG PO TABS: 1.0000 | ORAL_TABLET | Freq: Two times a day (BID) | ORAL | 0 refills | Status: DC

## 2021-11-27 MED ORDER — FOSFOMYCIN TROMETHAMINE 3 G PO PACK
3.0000 g | PACK | Freq: Once | ORAL | Status: AC
Start: 2021-11-27 — End: 2021-11-27
  Administered 2021-11-27: 3 g via ORAL
  Filled 2021-11-27: qty 3

## 2021-11-27 MED ORDER — ONDANSETRON 4 MG PO TBDP
4.0000 mg | ORAL_TABLET | Freq: Once | ORAL | Status: AC
  Administered 2021-11-27: 4 mg via ORAL
  Filled 2021-11-27: qty 1

## 2021-11-27 NOTE — ED Triage Notes (Signed)
Pt to ED via POV from home. Pt reports LLQ abdominal pain that started Wednesday. Pt denies hx kidney stones or ovarian cyst. Pt with hx diverticulitis. Pt referred by [facility] for CT scan. Pt has had gallbladder removed.

## 2021-11-27 NOTE — Assessment & Plan Note (Signed)
Would consider urine however pt going to ER

## 2021-11-27 NOTE — Progress Notes (Signed)
Established Patient Office Visit  Subjective:  Patient ID: Krista Carpenter, female    DOB: Jun 03, 1965  Age: 56 y.o. MRN: [id number]  CC:  Chief Complaint  Patient presents with   Abdominal Pain    Left side X 2 days some tenderness on right side. Hurts to walk    HPI Krista Carpenter is here today with concerns.   Left lower quadrant abdominal pain >right. Slight on right as well.  For the last 5 days. Initially started at the left of the belly button, for a few days and has since progressed.  Tender to the touch on left lower side.  The pain has woken her up the last three nights.   Very slight nausea, has not throw pu.  No diarrhea or constipation. Normal bowel movements.  No vaginal discharge.  No urinary symptoms to include frequency, urgency and or dysuria.  Slight left lower back pain that radiates from the front.   Does have h/o diverticulitis and h/o pyelonephritis that resulted in sepsis   Past Medical History:  Diagnosis Date   Acute diverticulitis    Acute sinusitis 02/05/2020   AKI (acute kidney injury) (HCC) 03/02/2020   Community acquired pneumonia of right lower lobe of lung 03/11/2020   Diverticula of colon    sigmoid and descending colon   Diverticulitis 11/04/2018   History of abnormal cervical Pap smear    IBS (irritable bowel syndrome)    Kidney infection 02/2020   Migraine    Pyelonephritis of left kidney 03/02/2020   Sepsis (HCC) 03/02/2020   Transaminitis 03/02/2020    Past Surgical History:  Procedure Laterality Date   CHOLECYSTECTOMY     colonoscopy     TONSILLECTOMY     WRIST ARTHROCENTESIS      Family History  Problem Relation Age of Onset   Cancer Mother        skin   Diabetes Father    Diabetes Brother     Social History   Socioeconomic History   Marital status: Married    Spouse name: Not on file   Number of children: Not on file   Years of education: Not on file   Highest education level: Not on file  Occupational  History   Not on file  Tobacco Use   Smoking status: Never   Smokeless tobacco: Never  Vaping Use   Vaping Use: Never used  Substance and Sexual Activity   Alcohol use: Yes    Alcohol/week: 0.0 standard drinks of alcohol    Comment: occasion   Drug use: No   Sexual activity: Yes    Partners: Male    Birth control/protection: I.U.D.  Other Topics Concern   Not on file  Social History Narrative   Married.   2 children.   Works at [employer].   Enjoys scrapbooking, reading.   Social Determinants of Health   Financial Resource Strain: Not on file  Food Insecurity: Not on file  Transportation Needs: Not on file  Physical Activity: Not on file  Stress: Not on file  Social Connections: Not on file  Intimate Partner Violence: Not on file    Outpatient Medications Prior to Visit  Medication Sig Dispense Refill   COMBIPATCH 0.05-0.14 MG/DAY Place 1 patch onto the skin 2 (two) times a week.     desonide (DESOWEN) 0.05 % cream Apply topically 2 (two) times daily.     eletriptan (RELPAX) 20 MG tablet Take 1 tablet by mouth at migraine  onset. May repeat in 2 hours if headache persists or recurs. 10 tablet 0   FLUoxetine (PROZAC) 40 MG capsule TAKE 1 CAPSULE BY MOUTH DAILY FOR ANXIETY AND DEPRESSION. 90 capsule 3   losartan (COZAAR) 100 MG tablet TAKE 1 TABLET BY MOUTH DAILY FOR BLOOD PRESSURE 90 tablet 3   PREMARIN vaginal cream Insert 0.5 applicatorful every day by vaginal route for two weeks then to twice a week     propranolol ER (INDERAL LA) 80 MG 24 hr capsule Take 1 capsule (80 mg total) by mouth daily. For headache prevention 90 capsule 0   traZODone (DESYREL) 100 MG tablet Take 1 tablet (100 mg total) by mouth at bedtime. For sleep. 90 tablet 3   No facility-administered medications prior to visit.    Allergies  Allergen Reactions   Oxycodone Itching        Objective:    Physical Exam Constitutional:      General: She is not in acute distress.    Appearance:  She is well-developed. She is obese. She is not ill-appearing, toxic-appearing or diaphoretic.  Cardiovascular:     Rate and Rhythm: Normal rate and regular rhythm.  Pulmonary:     Effort: Pulmonary effort is normal.     Breath sounds: Normal breath sounds.  Abdominal:     General: Abdomen is flat. Bowel sounds are increased. There is distension.     Palpations: Abdomen is soft.     Tenderness: There is abdominal tenderness in the right lower quadrant and left lower quadrant. There is guarding and rebound. Positive signs include McBurney's sign and psoas sign.     Hernia: No hernia is present.     Comments: Increased bowel sounds left upper quadrant abdomen 10/10 pain with palpation left lower quadrant 8/10 pain with rebound on rlq  Neurological:     Mental Status: She is alert.     BP 128/74   Pulse 67   Temp 98.3 F (36.8 C)   Resp 16   Ht 5' (1.524 m)   Wt 150 lb 2 oz (68.1 kg)   LMP 12/02/2012   SpO2 98%   BMI 29.32 kg/m  Wt Readings from Last 3 Encounters:  11/27/21 150 lb 2 oz (68.1 kg)  06/29/21 149 lb 9.6 oz (67.9 kg)  04/11/21 152 lb (68.9 kg)     Health Maintenance Due  Topic Date Due   HIV Screening  Never done   COVID-19 Vaccine (4 - Pfizer series) 03/16/2020   PAP SMEAR-Modifier  02/21/2021   MAMMOGRAM  05/11/2021   INFLUENZA VACCINE  11/21/2021    There are no preventive care reminders to display for this patient.  Lab Results  Component Value Date   TSH 1.04 05/22/2019   Lab Results  Component Value Date   WBC 8.8 06/29/2021   HGB 13.2 06/29/2021   HCT 39.1 06/29/2021   MCV 89.1 06/29/2021   PLT 323.0 06/29/2021   Lab Results  Component Value Date   NA 138 06/29/2021   K 4.2 06/29/2021   CO2 28 06/29/2021   GLUCOSE 78 06/29/2021   BUN 17 06/29/2021   CREATININE 1.03 06/29/2021   BILITOT 0.4 06/29/2021   ALKPHOS 35 (L) 06/29/2021   AST 68 (H) 06/29/2021   ALT 31 06/29/2021   PROT 7.1 06/29/2021   ALBUMIN 4.8 06/29/2021   CALCIUM  9.5 06/29/2021   ANIONGAP 10 03/07/2020   GFR 61.70 06/29/2021   Lab Results  Component Value Date   HGBA1C  5.8 06/29/2021      Assessment & Plan:   Problem List Items Addressed This Visit       Digestive   Diverticulosis of colon     Other   Low back pain    Would consider urine however pt going to ER      Relevant Orders   Urine Culture   POCT URINALYSIS DIP (CLINITEK)   Left lower quadrant abdominal pain - Primary    Suspected diverticulitis  Pt with severe pain D/w her the options of stat ct and blood work however feel we can not rule out more serious etiologies that could be fatal if not treated quickly so have advised pt to go to er   She is stable upon leaving and will drive directly to [facility]  She declines ems      Relevant Orders   Urine Culture   POCT URINALYSIS DIP (CLINITEK)   Right lower quadrant pain    With positive psoas and mcburneys  More likely diverticulitis but unable to r/o appendicitis Pt going to er.        No orders of the defined types were placed in this encounter.   Follow-up: No follow-ups on file.    Mort Sawyers, FNP

## 2021-11-27 NOTE — ED Notes (Signed)
See triage note  Presents with pain to left  side of abd   Pain is mid to lower abd   Pain started on wenesday

## 2021-11-27 NOTE — Assessment & Plan Note (Signed)
With positive psoas and mcburneys  More likely diverticulitis but unable to r/o appendicitis Pt going to er.

## 2021-11-27 NOTE — Assessment & Plan Note (Signed)
Suspected diverticulitis  Pt with severe pain D/w her the options of stat ct and blood work however feel we can not rule out more serious etiologies that could be fatal if not treated quickly so have advised pt to go to er   She is stable upon leaving and will drive directly to [facility]  She declines ems

## 2021-11-27 NOTE — ED Provider Notes (Signed)
[facility] Provider Note    Event Date/Time   First MD Initiated Contact with Patient 11/27/21 1234     (approximate)  History   Chief Complaint: Abdominal Pain  HPI  Krista Carpenter is a 56 y.o. female with a past medical history of diverticulitis in the past, pyelonephritis in the past, presents to the emergency department for left lower quadrant abdominal pain.  According to the patient for the past several days she has been experiencing left lower quadrant domino pain that is now extending somewhat to the left upper quadrant.  Patient denies any constipation or diarrhea denies any black or bloody stool.  No urinary symptoms.  No fever.  States moderate discomfort currently in the left lower quadrant.  Physical Exam   Triage Vital Signs: ED Triage Vitals  Enc Vitals Group     BP 11/27/21 1143 137/73     Pulse Rate 11/27/21 1143 70     Resp 11/27/21 1143 18     Temp 11/27/21 1143 98 F (36.7 C)     Temp Source 11/27/21 1143 Oral     SpO2 11/27/21 1143 98 %     Weight 11/27/21 1144 149 lb 14.6 oz (68 kg)     Height 11/27/21 1144 5' (1.524 m)     Head Circumference --      Peak Flow --      Pain Score 11/27/21 1144 6     Pain Loc --      Pain Edu? --      Excl. in GC? --     Most recent vital signs: Vitals:   11/27/21 1143 11/27/21 1252  BP: 137/73 120/67  Pulse: 70 62  Resp: 18 18  Temp: 98 F (36.7 C)   SpO2: 98% 100%    General: Awake, no distress.  CV:  Good peripheral perfusion.  Regular rate and rhythm  Resp:  Normal effort.  Equal breath sounds bilaterally.  Abd:  No distention.  Soft, nontender.  No rebound or guarding.   ED Results / Procedures / Treatments   RADIOLOGY  I personally reviewed and interpreted the CT images.  I do not see any significant abnormality seen on my evaluation. Radiology is read the CT scan as sigmoid diverticulitis.  No perforation or abscess.   MEDICATIONS ORDERED IN ED: Medications - No data  to display   IMPRESSION / MDM / ASSESSMENT AND PLAN / ED COURSE  I reviewed the triage vital signs and the nursing notes.  Patient's presentation is most consistent with acute presentation with potential threat to life or bodily function.  Patient presents to the emergency department for left lower quadrant abdominal pain over the past several days that is now worsening extending up to the left upper quadrant.  Patient has a history of diverticulitis as well as pyelonephritis.  Patient's labs are reassuring occluding a normal CBC chemistry negative lipase.  Patient's urinalysis does show bacteria possibly indicating UTI or pyelonephritis.  Given the patient's history of diverticulitis and urinary findings we will proceed with a CT renal scan to further evaluate.  Differential would still include UTI, pyelonephritis, diverticulitis, colitis.  CT consistent with sigmoid diverticulitis.  Consistent with the patient's presentation.  Given her urine findings we will dose with a one-time dose of fosfomycin in the emergency department and send a urine culture.  We will discharge patient on Augmentin twice daily x10 days as well as a small amount of pain medication to be used  if needed.  Discussed return precautions as well as PCP follow-up.  FINAL CLINICAL IMPRESSION(S) / ED DIAGNOSES   Left lower quadrant abdominal pain Diverticulitis  Note:  This document was prepared using Dragon voice recognition software and may include unintentional dictation errors.   Minna Antis, MD 11/27/21 1358

## 2021-11-28 LAB — URINE CULTURE

## 2021-12-06 ENCOUNTER — Ambulatory Visit: Payer: 59 | Admitting: Primary Care

## 2021-12-06 ENCOUNTER — Encounter: Payer: Self-pay | Admitting: Primary Care

## 2021-12-06 VITALS — BP 120/74 | HR 70 | Temp 98.3°F | Ht 60.0 in | Wt 152.0 lb

## 2021-12-06 DIAGNOSIS — K5792 Diverticulitis of intestine, part unspecified, without perforation or abscess without bleeding: Secondary | ICD-10-CM

## 2021-12-06 DIAGNOSIS — G43709 Chronic migraine without aura, not intractable, without status migrainosus: Secondary | ICD-10-CM | POA: Diagnosis not present

## 2021-12-06 MED ORDER — PROPRANOLOL HCL ER 120 MG PO CP24: 120.0000 mg | ORAL_CAPSULE | Freq: Every day | ORAL | 0 refills | Status: DC

## 2021-12-06 NOTE — Progress Notes (Signed)
Subjective:    Patient ID: Marlowe Aschoff, female    DOB: 1965-10-28, 56 y.o.   MRN: [id number]  HPI  Faithanne MARIO VOONG is a very pleasant 56 y.o. female with a history of hypertension, migraines, hyperlipidemia, diverticulosis, acute pyelonephritis, acute diverticulitis who presents today for ED follow-up and migraine follow up.   1) Acute Diverticulitis:  She presented to [facility] ED on 11/27/2021 for a several day history of left lower quadrant abdominal pain with radiation to the upper left quadrant.  During her stay in the ED she underwent CT abdomen pelvis which was consistent for sigmoid diverticulitis.  Urinalysis showed bacteria which was questionable for cystitis or pyelonephritis.   Given her CT and urinalysis findings she was treated with a dose of fosfomycin for cystitis, and she was discharged home later that afternoon with a prescription for Augmentin twice daily x10 days.  Since her ED visit she is feeling better. She continues to experience "twinges" to the left lower quadrant of her abdomen. She has been compliant to her Augmentin.  Her urine culture was negative for infection. She's experienced some diarrhea which she feels is secondary to Augmentin. She denies nausea, vomiting, fevers.   2) Frequent Headaches/Migraines: Currently managed on propranolol ER 80 mg for migraine/headache prevention treatment which she's taken for several months. Since initiation on propranolol she's not noticed improvement in headaches. She continues to experience daily headaches. Migraines are occurring once every 2 weeks. She is managed on eletriptan 20 mg for abortive treatment, could not tolerate Maxalt or Imitrex. The eletriptan helps some, but does find relief with second dose.   Previously [facility], underwent MRI brain in 2002 which was negative. She's failed numerous medications, including Topamax and amitriptyline.   Review of Systems  Respiratory:   Negative for shortness of breath.   Gastrointestinal:  Positive for abdominal pain and diarrhea. Negative for blood in stool, constipation, nausea and vomiting.  Neurological:  Positive for headaches.         Past Medical History:  Diagnosis Date   Acute diverticulitis    Acute sinusitis 02/05/2020   AKI (acute kidney injury) (HCC) 03/02/2020   Community acquired pneumonia of right lower lobe of lung 03/11/2020   Diverticula of colon    sigmoid and descending colon   Diverticulitis 11/04/2018   History of abnormal cervical Pap smear    IBS (irritable bowel syndrome)    Kidney infection 02/2020   Migraine    Pyelonephritis of left kidney 03/02/2020   Sepsis (HCC) 03/02/2020   Transaminitis 03/02/2020    Social History   Socioeconomic History   Marital status: Married    Spouse name: Not on file   Number of children: Not on file   Years of education: Not on file   Highest education level: Not on file  Occupational History   Not on file  Tobacco Use   Smoking status: Never   Smokeless tobacco: Never  Vaping Use   Vaping Use: Never used  Substance and Sexual Activity   Alcohol use: Yes    Alcohol/week: 0.0 standard drinks of alcohol    Comment: occasion   Drug use: No   Sexual activity: Yes    Partners: Male    Birth control/protection: I.U.D.  Other Topics Concern   Not on file  Social History Narrative   Married.   2 children.   Works at [employer].   Enjoys scrapbooking, reading.   Social Determinants  of Health   Financial Resource Strain: Not on file  Food Insecurity: Not on file  Transportation Needs: Not on file  Physical Activity: Not on file  Stress: Not on file  Social Connections: Not on file  Intimate Partner Violence: Not on file    Past Surgical History:  Procedure Laterality Date   CHOLECYSTECTOMY     colonoscopy     TONSILLECTOMY     WRIST ARTHROCENTESIS      Family History  Problem Relation Age of Onset   Cancer Mother         skin   Diabetes Father    Diabetes Brother     Allergies  Allergen Reactions   Oxycodone Itching    Current Outpatient Medications on File Prior to Visit  Medication Sig Dispense Refill   amoxicillin-clavulanate (AUGMENTIN) 875-125 MG tablet Take 1 tablet by mouth 2 (two) times daily. 20 tablet 0   COMBIPATCH 0.05-0.14 MG/DAY Place 1 patch onto the skin 2 (two) times a week.     desonide (DESOWEN) 0.05 % cream Apply topically 2 (two) times daily.     eletriptan (RELPAX) 20 MG tablet Take 1 tablet by mouth at migraine onset. May repeat in 2 hours if headache persists or recurs. 10 tablet 0   FLUoxetine (PROZAC) 40 MG capsule TAKE 1 CAPSULE BY MOUTH DAILY FOR ANXIETY AND DEPRESSION. 90 capsule 3   losartan (COZAAR) 100 MG tablet TAKE 1 TABLET BY MOUTH DAILY FOR BLOOD PRESSURE 90 tablet 3   PREMARIN vaginal cream Insert 0.5 applicatorful every day by vaginal route for two weeks then to twice a week     traZODone (DESYREL) 100 MG tablet Take 1 tablet (100 mg total) by mouth at bedtime. For sleep. 90 tablet 3   No current facility-administered medications on file prior to visit.    BP 120/74   Pulse 70   Temp 98.3 F (36.8 C) (Oral)   Ht 5' (1.524 m)   Wt 152 lb (68.9 kg)   LMP 12/02/2012   SpO2 96%   BMI 29.69 kg/m  Objective:   Physical Exam Cardiovascular:     Rate and Rhythm: Normal rate and regular rhythm.  Pulmonary:     Effort: Pulmonary effort is normal.     Breath sounds: Normal breath sounds.  Abdominal:     General: Bowel sounds are normal.     Palpations: Abdomen is soft.     Tenderness: There is abdominal tenderness in the left lower quadrant. There is no guarding.     Comments: Mild tenderness to LLQ  Musculoskeletal:     Cervical back: Neck supple.  Skin:    General: Skin is warm and dry.           Assessment & Plan:   Problem List Items Addressed This Visit       Cardiovascular and Mediastinum   Migraine - Primary    Uncontrolled, also with  frequent daily headaches.  Increase propranolol ER to 120 mg daily. Continue eletriptan 20 mg PRN.  Referral placed to neurology for further management as she has failed Topamax, amitriptyline, and what seems to be propranolol.       Relevant Medications   propranolol ER (INDERAL LA) 120 MG 24 hr capsule   Other Relevant Orders   Ambulatory referral to Neurology     Digestive   Acute diverticulitis    Second known episode within 3 years.  Improving.   ED notes, labs, imaging reviewed. Continue Augmentin BID  until complete.  Discussed return precautions.           [name], NP

## 2021-12-06 NOTE — Assessment & Plan Note (Signed)
Uncontrolled, also with frequent daily headaches.  Increase propranolol ER to 120 mg daily. Continue eletriptan 20 mg PRN.  Referral placed to neurology for further management as she has failed Topamax, amitriptyline, and what seems to be propranolol.

## 2021-12-06 NOTE — Patient Instructions (Addendum)
We increased the dose of your propranolol to 120 mg. I sent a new prescription to your pharmacy. Take 1 capsule by mouth once daily for headache prevention.  You will be contacted regarding your referral to neurology for headache management.  Please let us know if you have not been contacted within two weeks.   Finish your antibiotics. Notify me if your pain increases.  Please schedule a physical to meet with me in March 2024.  It was a pleasure to see you today!

## 2021-12-06 NOTE — Assessment & Plan Note (Signed)
Second known episode within 3 years.  Improving.   ED notes, labs, imaging reviewed. Continue Augmentin BID until complete.  Discussed return precautions.

## 2021-12-12 ENCOUNTER — Encounter: Payer: Self-pay | Admitting: Neurology

## 2021-12-20 ENCOUNTER — Encounter: Payer: Self-pay | Admitting: Primary Care

## 2022-02-16 ENCOUNTER — Other Ambulatory Visit: Payer: Self-pay | Admitting: Primary Care

## 2022-02-16 DIAGNOSIS — G4700 Insomnia, unspecified: Secondary | ICD-10-CM

## 2022-03-18 ENCOUNTER — Other Ambulatory Visit: Payer: Self-pay | Admitting: Primary Care

## 2022-03-18 DIAGNOSIS — G43709 Chronic migraine without aura, not intractable, without status migrainosus: Secondary | ICD-10-CM

## 2022-05-10 NOTE — Progress Notes (Signed)
NEUROLOGY CONSULTATION NOTE  [name] MRN: [id number] DOB: Mar 09, 1966  Referring provider: Alma Friendly, NP Primary care provider: Alma Friendly, NP  Reason for consult:  migraines  Assessment/Plan:   Chronic migraine without aura, without status migrainosus, not intractable  Migraine prevention:  Plan to start Emgality.  Continue propranolol for now. Migraine rescue:  She will try samples of both Ubrelvy and Nurtec and update me on her preference Stop Tylenol and Advil.  Rule of thumb - limit use of pain relievers to no more than 2 days out of week to prevent risk of rebound or medication-overuse headache. Keep headache diary Follow up 4 to 5 months.    Subjective:  Krista Carpenter is a 57 year old right-handed female with IBS who presents for migraines.  History supplemented by referring provider's note.  Onset:  since she was young.  Became worse after having her second child in 1999  Location:  varies.  Mostly across forehead and temples bilateral; occipital/base of skull and down neck bilateral Quality:  pressure, throbbing, pounding Intensity:  moderate to severe.   Aura:  absent Prodrome:  absent Associated symptoms:  Floaters and blurred vision (all the time), photophobia, phonophobia, infrequent nausea.  She denies associated unilateral numbness or weakness. Duration:  several hours, sometimes 1-2 days, rarely 5 days Frequency:  27-28 days a month (up to 5 days a month is severe).  Often occurs after lunch or wakes up with it Frequency of abortive medication: Tylenol or Advil daily (for years) Triggers:  change in weather, MSG, looking at the computer screen Relieving factors:  laying down, ice pack Activity:  movement aggravates.  Up to 5 days a month difficult to function  Prior workup for headaches included MRI of brain from 12/04/2000 which was negative.    Past NSAIDS/analgesics:  celecoxib, meloxicam, ketorolac IM Past abortive triptans:   sumatriptan (did not tolerate), rizatriptan (did not tolerate), eletriptan Past abortive ergotamine:  none Past muscle relaxants:  orphenadrine Past anti-emetic:  ondansetron, promethazine Past antihypertensive medications:  none Past antidepressant medications:  amitriptyline (side effects), sertraline Past anticonvulsant medications:  topiramate 50mg  TID Past anti-CGRP:  Roselyn Meier Past vitamins/Herbal/Supplements:  none Past antihistamines/decongestants:  none Other past therapies:  trigger point injections  Current NSAIDS/analgesics:  Tylenol or Advil Current triptans:  none Current ergotamine:  none Current anti-emetic:  none Current muscle relaxants:  none Current Antihypertensive medications:  propranolol ER 120mg  daily, losartan Current Antidepressant medications:  fluoxetine, trazodone (insomnia) Current Anticonvulsant medications:  none Current anti-CGRP:  none Current Vitamins/Herbal/Supplements:  none Current Antihistamines/Decongestants:  none Other therapy:  none   Caffeine:  1 cup coffee daily (sometimes 2). Diet:  water.  Rarely soda.  Does not skip meals Exercise:  not routine Depression:  stable; Anxiety:  stable Other pain:  no Sleep hygiene:  okay with trazodone Family history of headache:  mom (migraines), maternal aunt (migraines)      PAST MEDICAL HISTORY: Past Medical History:  Diagnosis Date   Acute diverticulitis    Acute sinusitis 02/05/2020   AKI (acute kidney injury) ([place]) 03/02/2020   Community acquired pneumonia of right lower lobe of lung 03/11/2020   Diverticula of colon    sigmoid and descending colon   Diverticulitis 11/04/2018   History of abnormal cervical Pap smear    IBS (irritable bowel syndrome)    Kidney infection 02/2020   Migraine    Pyelonephritis of left kidney 03/02/2020   Sepsis ([place]) 03/02/2020   Transaminitis 03/02/2020  PAST SURGICAL HISTORY: Past Surgical History:  Procedure Laterality Date   CHOLECYSTECTOMY      colonoscopy     DILATION AND CURETTAGE OF UTERUS     TONSILLECTOMY     WRIST ARTHROCENTESIS      MEDICATIONS: Current Outpatient Medications on File Prior to Visit  Medication Sig Dispense Refill   amoxicillin-clavulanate (AUGMENTIN) 875-125 MG tablet Take 1 tablet by mouth 2 (two) times daily. 20 tablet 0   COMBIPATCH 0.05-0.14 MG/DAY Place 1 patch onto the skin 2 (two) times a week.     desonide (DESOWEN) 0.05 % cream Apply topically 2 (two) times daily.     eletriptan (RELPAX) 20 MG tablet Take 1 tablet by mouth at migraine onset. May repeat in 2 hours if headache persists or recurs. 10 tablet 0   FLUoxetine (PROZAC) 40 MG capsule TAKE 1 CAPSULE BY MOUTH DAILY FOR ANXIETY AND DEPRESSION. 90 capsule 3   losartan (COZAAR) 100 MG tablet TAKE 1 TABLET BY MOUTH DAILY FOR BLOOD PRESSURE 90 tablet 3   PREMARIN vaginal cream Insert 0.5 applicatorful every day by vaginal route for two weeks then to twice a week     propranolol ER (INDERAL LA) 120 MG 24 hr capsule TAKE 1 CAPSULE (120 MG TOTAL) BY MOUTH DAILY. FOR HEADACHE PREVENTION. 90 capsule 0   traZODone (DESYREL) 100 MG tablet Take 1 tablet (100 mg total) by mouth at bedtime. For sleep. 90 tablet 3   No current facility-administered medications on file prior to visit.    ALLERGIES: Allergies  Allergen Reactions   Oxycodone Itching    FAMILY HISTORY: Family History  Problem Relation Age of Onset   Cancer Mother        skin   Irritable bowel syndrome Mother    Diverticulosis Mother    Diabetes Father    Diabetes Brother    Diabetes Maternal Uncle     Objective:  Blood pressure 129/81, pulse 71, height 5' (1.524 m), weight 156 lb 6.4 oz (70.9 kg), last menstrual period 12/02/2012, SpO2 97 %. General: No acute distress.  Patient appears well-groomed.   Head:  Normocephalic/atraumatic Eyes:  fundi examined but not visualized Neck: supple, no paraspinal tenderness, full range of motion Back: No paraspinal tenderness Heart:  regular rate and rhythm Lungs: Clear to auscultation bilaterally. Vascular: No carotid bruits. Neurological Exam: Mental status: alert and oriented to person, place, and time, speech fluent and not dysarthric, language intact. Cranial nerves: CN I: not tested CN II: pupils equal, round and reactive to light, visual fields intact CN III, IV, VI:  full range of motion, no nystagmus, no ptosis CN V: facial sensation intact. CN VII: upper and lower face symmetric CN VIII: hearing intact CN IX, X: gag intact, uvula midline CN XI: sternocleidomastoid and trapezius muscles intact CN XII: tongue midline Bulk & Tone: normal, no fasciculations. Motor:  muscle strength 5/5 throughout Sensation:  Pinprick, temperature and vibratory sensation intact. Deep Tendon Reflexes:  2+ throughout,  toes downgoing.   Finger to nose testing:  Without dysmetria.   Heel to shin:  Without dysmetria.   Gait:  Normal station and stride.  Romberg negative.    Thank you for allowing me to take part in the care of this patient.  Metta Clines, DO  CC: Alma Friendly, NP

## 2022-05-14 ENCOUNTER — Other Ambulatory Visit: Payer: Self-pay

## 2022-05-14 ENCOUNTER — Encounter: Payer: Self-pay | Admitting: Neurology

## 2022-05-14 ENCOUNTER — Ambulatory Visit (INDEPENDENT_AMBULATORY_CARE_PROVIDER_SITE_OTHER): Payer: 59 | Admitting: Neurology

## 2022-05-14 VITALS — BP 129/81 | HR 71 | Ht 60.0 in | Wt 156.4 lb

## 2022-05-14 DIAGNOSIS — G43709 Chronic migraine without aura, not intractable, without status migrainosus: Secondary | ICD-10-CM | POA: Diagnosis not present

## 2022-05-14 MED ORDER — EMGALITY 120 MG/ML ~~LOC~~ SOAJ
240.0000 mg | Freq: Once | SUBCUTANEOUS | 0 refills | Status: AC
  Filled 2022-05-14 – 2022-06-07 (×2): qty 2, 30d supply, fill #0

## 2022-05-14 NOTE — Progress Notes (Signed)
Medication Samples have been provided to the patient.  Drug name: Hinda Kehr       Strength: 100 mg        Qty: 4  LOT: [id number]  Exp.Date: 12/2023  Dosing instructions: as needed  The patient has been instructed regarding the correct time, dose, and frequency of taking this medication, including desired effects and most common side effects.   Venetia Night 12:10 PM 05/14/2022    Medication Samples have been provided to the patient.  Drug name: Nurtec       Strength: 75 mg        Qty: 2  LOT: [id number]  Exp.Date: 11/2023  Dosing instructions: as needed  The patient has been instructed regarding the correct time, dose, and frequency of taking this medication, including desired effects and most common side effects.   Venetia Night 12:10 PM 05/14/2022

## 2022-05-14 NOTE — Patient Instructions (Addendum)
  Start Emgality - 2 injections for first dose.  Then 1 injection every 28 days thereafter.  Let me know when you pick up the medication and I will send in prescription for standing order.   For rescue, try either medication: Ubrelvy - 1 tablet as needed.  May repeat after 2 hours.  Maximum 2 tablets in 24 hours. Nurtec - 1 tablet daily as needed Let me know which you prefer to be called in [place].  Rule of thumb - Limit use of pain relievers to no more than 2 days out of the week.  These medications include acetaminophen, NSAIDs (ibuprofen/Advil/Motrin, naproxen/Aleve, triptans (Imitrex/sumatriptan), Excedrin, and narcotics.  This will help reduce risk of rebound headaches. Routine exercise Stay adequately hydrated (aim for 64 oz water daily) Keep headache diary Maintain proper stress management Maintain proper sleep hygiene Do not skip meals Consider supplements:  magnesium citrate 400mg  daily, riboflavin 400mg  daily, coenzyme Q10 100mg  three times daily. Follow up 4 to 5 months.

## 2022-05-16 ENCOUNTER — Other Ambulatory Visit: Payer: Self-pay

## 2022-05-17 ENCOUNTER — Other Ambulatory Visit: Payer: Self-pay

## 2022-05-18 ENCOUNTER — Other Ambulatory Visit: Payer: Self-pay

## 2022-05-18 MED ORDER — PROPRANOLOL HCL ER 120 MG PO CP24
120.0000 mg | ORAL_CAPSULE | Freq: Every day | ORAL | 0 refills | Status: DC
  Filled 2022-05-18: qty 30, 30d supply, fill #0

## 2022-05-18 MED ORDER — TRAZODONE HCL 100 MG PO TABS
100.0000 mg | ORAL_TABLET | Freq: Every evening | ORAL | 3 refills | Status: DC
  Filled 2022-05-18: qty 90, 90d supply, fill #0

## 2022-05-18 MED ORDER — ESTROGENS CONJUGATED 0.625 MG/GM VA CREA
TOPICAL_CREAM | VAGINAL | 2 refills | Status: DC
  Filled 2022-05-18: qty 30, 30d supply, fill #0

## 2022-05-21 ENCOUNTER — Other Ambulatory Visit: Payer: Self-pay

## 2022-05-21 ENCOUNTER — Other Ambulatory Visit: Payer: Self-pay | Admitting: Primary Care

## 2022-05-21 DIAGNOSIS — I10 Essential (primary) hypertension: Secondary | ICD-10-CM

## 2022-05-21 NOTE — Telephone Encounter (Signed)
Received refill request for Premarin vaginal cream, but it doesn't look like I've prescribed this. Can you get some additional information from patient?

## 2022-05-21 NOTE — Telephone Encounter (Signed)
From: Rita Ohara To: Office of Pleas Koch, NP Sent: 05/21/2022 11:37 AM EST Subject: Medication Renewal Request  Refills have been requested for the following medications:   PREMARIN vaginal cream  Preferred pharmacy: [facility] Delivery method: Arlyss Gandy

## 2022-05-22 ENCOUNTER — Other Ambulatory Visit: Payer: Self-pay

## 2022-05-22 MED ORDER — LOSARTAN POTASSIUM 100 MG PO TABS
100.0000 mg | ORAL_TABLET | Freq: Every day | ORAL | 0 refills | Status: DC
  Filled 2022-05-22: qty 90, 90d supply, fill #0

## 2022-05-22 NOTE — Telephone Encounter (Signed)
Called patient she stated that the request was suppose to be for Losartan.  She does not need the Premarin vaginal cream refilled at this time.   She stated she switched insurance companies in the new year and needs a new prescription for the Losartan send to the [facility] community pharmacy.

## 2022-05-22 NOTE — Telephone Encounter (Signed)
Unable to reach patient. Left voicemail to return call to our office.   

## 2022-05-22 NOTE — Telephone Encounter (Signed)
Patient called and was returning [place] phone call.

## 2022-05-22 NOTE — Telephone Encounter (Signed)
Noted, Rx for losartan sent to [facility].

## 2022-05-23 ENCOUNTER — Encounter: Payer: Self-pay | Admitting: Neurology

## 2022-05-28 ENCOUNTER — Encounter: Payer: Self-pay | Admitting: Internal Medicine

## 2022-05-28 ENCOUNTER — Telehealth: Payer: Self-pay | Admitting: Pharmacy Technician

## 2022-05-28 ENCOUNTER — Ambulatory Visit (INDEPENDENT_AMBULATORY_CARE_PROVIDER_SITE_OTHER): Payer: 59 | Admitting: Internal Medicine

## 2022-05-28 VITALS — BP 108/60 | HR 74 | Temp 96.8°F | Ht 60.0 in | Wt 157.0 lb

## 2022-05-28 DIAGNOSIS — J029 Acute pharyngitis, unspecified: Secondary | ICD-10-CM | POA: Insufficient documentation

## 2022-05-28 LAB — POC COVID19 BINAXNOW: SARS Coronavirus 2 Ag: NEGATIVE

## 2022-05-28 LAB — POCT RAPID STREP A (OFFICE): Rapid Strep A Screen: NEGATIVE

## 2022-05-28 NOTE — Assessment & Plan Note (Signed)
Strep and COVID both negative Likely just viral infection Discussed analgesics, cough drops Call in if worsens, would consider empiric antibiotic

## 2022-05-28 NOTE — Telephone Encounter (Signed)
Patient Advocate Encounter   Received notification that prior authorization for Emgality 120MG /ML auto-injectors (migraine) is required.   PA submitted on 05/28/2022 Key [id number] Status is pending       Lyndel Safe, [place] Patient Advocate Specialist [place] Patient Advocate Team Direct Number: [phone]  Fax: [phone]

## 2022-05-28 NOTE — Progress Notes (Signed)
Subjective:    Patient ID: Krista Carpenter, female    DOB: 14-Jul-1965, 57 y.o.   MRN: [id number]  HPI Here due to sore throat  Sore throat, headache, body aches Some ear pressure Started 2 days ago----though some headache for a few days before that No fever Some cough--dry Some drainage--back of throat  Daughter did have strep--was exposed Using alka seltzer---helps  a little  Current Outpatient Medications on File Prior to Visit  Medication Sig Dispense Refill   COMBIPATCH 0.05-0.14 MG/DAY Place 1 patch onto the skin 2 (two) times a week.     desonide (DESOWEN) 0.05 % cream Apply topically 2 (two) times daily.     FLUoxetine (PROZAC) 40 MG capsule TAKE 1 CAPSULE BY MOUTH DAILY FOR ANXIETY AND DEPRESSION. 90 capsule 3   losartan (COZAAR) 100 MG tablet Take 1 tablet (100 mg total) by mouth daily. for blood pressure 90 tablet 0   PREMARIN vaginal cream Insert 0.5 applicatorful every day by vaginal route for two weeks then to twice a week     propranolol ER (INDERAL LA) 120 MG 24 hr capsule Take 1 capsule (120 mg total) by mouth daily for headache prevention. 90 capsule 0   traZODone (DESYREL) 100 MG tablet Take 1 tablet (100 mg total) by mouth at bedtime. 90 tablet 3   conjugated estrogens (PREMARIN) vaginal cream Insert 0.5 applicatorful twice a week (Patient not taking: Reported on 05/28/2022) 30 g 2   No current facility-administered medications on file prior to visit.    Allergies  Allergen Reactions   Oxycodone Itching    Past Medical History:  Diagnosis Date   Acute diverticulitis    Acute sinusitis 02/05/2020   AKI (acute kidney injury) ([place]) 03/02/2020   Community acquired pneumonia of right lower lobe of lung 03/11/2020   Diverticula of colon    sigmoid and descending colon   Diverticulitis 11/04/2018   History of abnormal cervical Pap smear    IBS (irritable bowel syndrome)    Kidney infection 02/2020   Migraine    Pyelonephritis of left kidney 03/02/2020    Sepsis ([place]) 03/02/2020   Transaminitis 03/02/2020    Past Surgical History:  Procedure Laterality Date   CHOLECYSTECTOMY     colonoscopy     DILATION AND CURETTAGE OF UTERUS     TONSILLECTOMY     WRIST ARTHROCENTESIS      Family History  Problem Relation Age of Onset   Cancer Mother        skin   Irritable bowel syndrome Mother    Diverticulosis Mother    Diabetes Father    Diabetes Brother    Diabetes Maternal Uncle     Social History   Socioeconomic History   Marital status: Married    Spouse name: Not on file   Number of children: Not on file   Years of education: Not on file   Highest education level: Not on file  Occupational History   Not on file  Tobacco Use   Smoking status: Never   Smokeless tobacco: Never  Vaping Use   Vaping Use: Never used  Substance and Sexual Activity   Alcohol use: Yes    Alcohol/week: 0.0 standard drinks of alcohol    Comment: occasion   Drug use: No   Sexual activity: Yes    Partners: Male    Birth control/protection: I.U.D.  Other Topics Concern   Not on file  Social History Narrative   Married.   2  children.   Works at [employer].   Enjoys scrapbooking, reading.   Right handed   Social Determinants of Health   Financial Resource Strain: Not on file  Food Insecurity: Not on file  Transportation Needs: Not on file  Physical Activity: Not on file  Stress: Not on file  Social Connections: Not on file  Intimate Partner Violence: Not on file   Review of Systems No rash No change in taste or smell Some nausea--especially after eating Appetite is off--but able to eat    Objective:   Physical Exam Constitutional:      Appearance: She is well-developed.  HENT:     Head:     Comments: No sinus tenderness    Right Ear: Tympanic membrane and ear canal normal.     Left Ear: Tympanic membrane and ear canal normal.     Mouth/Throat:     Comments: Mild pharyngeal injection without exudate or tonsillar  enlargement Pulmonary:     Effort: Pulmonary effort is normal.     Breath sounds: Normal breath sounds. No wheezing or rales.  Musculoskeletal:     Cervical back: Neck supple.  Lymphadenopathy:     Cervical: No cervical adenopathy.  Neurological:     Mental Status: She is alert.            Assessment & Plan:

## 2022-05-30 ENCOUNTER — Other Ambulatory Visit: Payer: Self-pay | Admitting: Primary Care

## 2022-05-30 DIAGNOSIS — F4323 Adjustment disorder with mixed anxiety and depressed mood: Secondary | ICD-10-CM

## 2022-05-31 ENCOUNTER — Telehealth: Payer: 59 | Admitting: Physician Assistant

## 2022-05-31 ENCOUNTER — Other Ambulatory Visit: Payer: Self-pay

## 2022-05-31 DIAGNOSIS — J019 Acute sinusitis, unspecified: Secondary | ICD-10-CM

## 2022-05-31 DIAGNOSIS — B9689 Other specified bacterial agents as the cause of diseases classified elsewhere: Secondary | ICD-10-CM | POA: Diagnosis not present

## 2022-05-31 MED ORDER — AMOXICILLIN-POT CLAVULANATE 875-125 MG PO TABS
1.0000 | ORAL_TABLET | Freq: Two times a day (BID) | ORAL | 0 refills | Status: DC
  Filled 2022-05-31: qty 14, 7d supply, fill #0

## 2022-05-31 NOTE — Progress Notes (Signed)
I have spent 5 minutes in review of e-visit questionnaire, review and updating patient chart, medical decision making and response to patient.   Jalaysha Skilton [place] Field Staniszewski, PA-C    

## 2022-05-31 NOTE — Telephone Encounter (Signed)
Unable to reach patient. Left voicemail to return call to our office.   

## 2022-05-31 NOTE — Telephone Encounter (Signed)
Received refill request for fluoxetine 40 mg. Dose she have enough on hand? Should have enough through April. Or is she out?

## 2022-05-31 NOTE — Progress Notes (Signed)

## 2022-06-04 NOTE — Telephone Encounter (Signed)
Called and spoke with patient she states she has enough of the fluoxetine to last her until April. She does not need a refill at this time.

## 2022-06-07 ENCOUNTER — Other Ambulatory Visit: Payer: Self-pay

## 2022-06-11 ENCOUNTER — Other Ambulatory Visit: Payer: Self-pay

## 2022-06-11 ENCOUNTER — Other Ambulatory Visit (HOSPITAL_COMMUNITY): Payer: Self-pay

## 2022-06-11 MED ORDER — EMGALITY 120 MG/ML ~~LOC~~ SOAJ
120.0000 mg | SUBCUTANEOUS | 5 refills | Status: DC
  Filled 2022-06-11: qty 1.12, fill #0
  Filled 2022-06-13 – 2022-06-30 (×2): qty 1, 30d supply, fill #0
  Filled 2022-08-01: qty 1, 30d supply, fill #1
  Filled 2022-08-27 (×2): qty 1, 30d supply, fill #2
  Filled 2022-10-02: qty 1, 30d supply, fill #3
  Filled 2022-10-20: qty 1, 30d supply, fill #4
  Filled 2022-11-27 – 2022-12-04 (×2): qty 1, 30d supply, fill #5

## 2022-06-13 ENCOUNTER — Other Ambulatory Visit: Payer: Self-pay

## 2022-06-28 ENCOUNTER — Other Ambulatory Visit: Payer: Self-pay

## 2022-06-30 ENCOUNTER — Other Ambulatory Visit: Payer: Self-pay | Admitting: Primary Care

## 2022-07-01 ENCOUNTER — Other Ambulatory Visit: Payer: Self-pay

## 2022-07-02 ENCOUNTER — Other Ambulatory Visit: Payer: Self-pay

## 2022-07-02 ENCOUNTER — Other Ambulatory Visit: Payer: Self-pay | Admitting: Primary Care

## 2022-07-02 NOTE — Telephone Encounter (Signed)
Refill request denied. I do not fill for patient. Sees GYN

## 2022-07-02 NOTE — Telephone Encounter (Signed)
From: Rita Ohara To: Office of Pleas Koch, NP Sent: 06/30/2022 8:33 PM EST Subject: Medication Renewal Request  Refills have been requested for the following medications:   COMBIPATCH 0.05-0.14 MG/DAY  Preferred pharmacy: [facility] Delivery method: Arlyss Gandy

## 2022-07-03 ENCOUNTER — Encounter: Payer: 59 | Admitting: Primary Care

## 2022-07-04 ENCOUNTER — Other Ambulatory Visit: Payer: Self-pay

## 2022-07-06 ENCOUNTER — Other Ambulatory Visit: Payer: Self-pay

## 2022-07-13 ENCOUNTER — Encounter: Payer: Self-pay | Admitting: Family Medicine

## 2022-07-13 ENCOUNTER — Ambulatory Visit: Payer: 59 | Admitting: Family Medicine

## 2022-07-13 ENCOUNTER — Other Ambulatory Visit: Payer: Self-pay

## 2022-07-13 ENCOUNTER — Telehealth: Payer: 59 | Admitting: Family Medicine

## 2022-07-13 ENCOUNTER — Other Ambulatory Visit: Payer: Self-pay | Admitting: Primary Care

## 2022-07-13 VITALS — BP 112/70 | HR 67 | Temp 98.2°F | Ht 60.0 in | Wt 157.1 lb

## 2022-07-13 DIAGNOSIS — R051 Acute cough: Secondary | ICD-10-CM

## 2022-07-13 DIAGNOSIS — J329 Chronic sinusitis, unspecified: Secondary | ICD-10-CM

## 2022-07-13 LAB — POC COVID19 BINAXNOW: SARS Coronavirus 2 Ag: NEGATIVE

## 2022-07-13 MED ORDER — CLARITHROMYCIN 500 MG PO TABS
500.0000 mg | ORAL_TABLET | Freq: Two times a day (BID) | ORAL | 0 refills | Status: AC
  Filled 2022-07-13: qty 20, 10d supply, fill #0

## 2022-07-13 NOTE — Patient Instructions (Signed)
Rest. Fluids. Complete course of antibiotics. Start flonase 2 sprays per nostril daily, an start nasal saline irrigation.  Can use ibuprofen for face pain.

## 2022-07-13 NOTE — Progress Notes (Signed)
Patient ID: Krista Carpenter, female    DOB: 09-04-1965, 57 y.o.   MRN: [id number]  This visit was conducted in person.  BP 112/70   Pulse 67   Temp 98.2 F (36.8 C) (Temporal)   Ht 5' (1.524 m)   Wt 157 lb 2 oz (71.3 kg)   LMP 12/02/2012   SpO2 97%   BMI 30.69 kg/m    CC:  Chief Complaint  Patient presents with   Cough    Symptoms started on Monday No Covid Test Feels like what she had when she saw Dr. Brooke Bonito on 05/28/22   Facial Pain   Dizziness   Ear Fullness    Subjective:   HPI: Krista Carpenter is a 57 y.o. female patient of Pleas Koch, NP presenting on 07/13/2022 for Cough (Symptoms started on Monday/No Covid Test/Feels like what she had when she saw Dr. Brooke Bonito on 05/28/22), Facial Pain, Dizziness, and Ear Fullness  Reviewed ED visit from earlier today diagnosed with recurrent sinusitis... From symptoms recommended returning to PCP for evaluation.  Reviewed office visit from May 28, 2022 with Dr. Silvio Pate.  Diagnosed with viral upper respiratory tract infection.  Strep and COVID both negative. Had additional office visit May 31, 2022 with ED visit diagnosed with acute bacterial sinusitis and treated with Augmentin 875/125 p.o. twice daily x 7 days She feels like symptoms resolved 100% for several week except for slight lingering cough  Date of onset:  5 days ago  Initial symptoms included  worsening cough Symptoms progressed to facial pain, pressure, ear fullness, dizziness.  No fever.  No ST. Chest feel funny with cough.. slight wheeze, no SOB.  She states her symptoms feel similar to February.   Sick contacts: None COVID testing:   none     She has tried to treat with  alkaselter plus, robitussin     No history of chronic lung disease such as asthma or COPD. Non-smoker.      Relevant past medical, surgical, family and social history reviewed and updated as indicated. Interim medical history since our last visit reviewed. Allergies and  medications reviewed and updated. Outpatient Medications Prior to Visit  Medication Sig Dispense Refill   COMBIPATCH 0.05-0.14 MG/DAY Place 1 patch onto the skin 2 (two) times a week.     desonide (DESOWEN) 0.05 % cream Apply topically 2 (two) times daily.     FLUoxetine (PROZAC) 40 MG capsule TAKE 1 CAPSULE BY MOUTH DAILY FOR ANXIETY AND DEPRESSION. 90 capsule 3   Galcanezumab-gnlm (EMGALITY) 120 MG/ML SOAJ Inject 120 mg into the skin every 30 (thirty) days. 1 mL 5   losartan (COZAAR) 100 MG tablet Take 1 tablet (100 mg total) by mouth daily. for blood pressure 90 tablet 0   PREMARIN vaginal cream Insert 0.5 applicatorful every day by vaginal route for two weeks then to twice a week     propranolol ER (INDERAL LA) 120 MG 24 hr capsule Take 1 capsule (120 mg total) by mouth daily for headache prevention. 90 capsule 0   traZODone (DESYREL) 100 MG tablet Take 1 tablet (100 mg total) by mouth at bedtime. 90 tablet 3   amoxicillin-clavulanate (AUGMENTIN) 875-125 MG tablet Take 1 tablet by mouth 2 (two) times daily. 14 tablet 0   conjugated estrogens (PREMARIN) vaginal cream Insert 0.5 applicatorful twice a week (Patient not taking: Reported on 05/28/2022) 30 g 2   No facility-administered medications prior to visit.     Per HPI  unless specifically indicated in ROS section below Review of Systems Objective:  BP 112/70   Pulse 67   Temp 98.2 F (36.8 C) (Temporal)   Ht 5' (1.524 m)   Wt 157 lb 2 oz (71.3 kg)   LMP 12/02/2012   SpO2 97%   BMI 30.69 kg/m   Wt Readings from Last 3 Encounters:  07/13/22 157 lb 2 oz (71.3 kg)  05/28/22 157 lb (71.2 kg)  05/14/22 156 lb 6.4 oz (70.9 kg)      Physical Exam    Results for orders placed or performed in visit on 07/13/22  POC COVID-19  Result Value Ref Range   SARS Coronavirus 2 Ag Negative Negative    Assessment and Plan  Acute cough Assessment & Plan: Acute, negative COVID testing. No clear strep symptoms despite expsoure ( ie no  ST)  Given symptoms now ongoing and worsening at day 5 of illness with possibility of recurrent sinusitis, will treat with oral antibiotics to cover bacterial superinfection.  Will use clarithromycin 500 mg twice daily x 10 days given is already been treated with Augmentin in the last 30 days.  Return and ER precautions provided.  Orders: -     POC COVID-19 BinaxNow  Other orders -     Clarithromycin; Take 1 tablet (500 mg total) by mouth 2 (two) times daily for 10 days.  Dispense: 20 tablet; Refill: 0    Return if symptoms worsen or fail to improve.   Eliezer Lofts, MD

## 2022-07-13 NOTE — Assessment & Plan Note (Signed)
Acute, negative COVID testing. No clear strep symptoms despite expsoure ( ie no ST)  Given symptoms now ongoing and worsening at day 5 of illness with possibility of recurrent sinusitis, will treat with oral antibiotics to cover bacterial superinfection.  Will use clarithromycin 500 mg twice daily x 10 days given is already been treated with Augmentin in the last 30 days.  Return and ER precautions provided.

## 2022-07-13 NOTE — Progress Notes (Signed)
Because recent treatment for similar symptoms, your condition warrants further evaluation and I recommend that you be seen in a face to face visit. You can call your PCP or go to one of the below Urgent Cares   NOTE: There will be NO CHARGE for this eVisit

## 2022-07-16 ENCOUNTER — Other Ambulatory Visit: Payer: Self-pay

## 2022-07-17 ENCOUNTER — Other Ambulatory Visit: Payer: Self-pay | Admitting: Neurology

## 2022-07-17 ENCOUNTER — Other Ambulatory Visit: Payer: Self-pay

## 2022-07-17 ENCOUNTER — Encounter: Payer: Self-pay | Admitting: Neurology

## 2022-07-17 MED ORDER — UBRELVY 100 MG PO TABS
1.0000 | ORAL_TABLET | ORAL | 11 refills | Status: DC | PRN
  Filled 2022-07-17 (×2): qty 16, 30d supply, fill #0
  Filled 2022-07-30: qty 16, 16d supply, fill #0
  Filled 2022-08-23: qty 16, 30d supply, fill #0
  Filled 2022-10-05: qty 16, 30d supply, fill #1
  Filled 2022-11-17: qty 16, 30d supply, fill #2
  Filled 2023-01-08: qty 16, 30d supply, fill #3
  Filled 2023-03-06 – 2023-05-06 (×6): qty 16, 30d supply, fill #4

## 2022-07-19 ENCOUNTER — Telehealth: Payer: Self-pay

## 2022-07-19 NOTE — Telephone Encounter (Signed)
PA needed for Ubrelvy.

## 2022-07-20 ENCOUNTER — Encounter: Payer: 59 | Admitting: Primary Care

## 2022-07-30 ENCOUNTER — Other Ambulatory Visit: Payer: Self-pay

## 2022-07-30 ENCOUNTER — Other Ambulatory Visit: Payer: Self-pay | Admitting: Primary Care

## 2022-07-30 DIAGNOSIS — R519 Headache, unspecified: Secondary | ICD-10-CM

## 2022-07-30 MED ORDER — PROPRANOLOL HCL ER 120 MG PO CP24
120.0000 mg | ORAL_CAPSULE | Freq: Every day | ORAL | 0 refills | Status: DC
  Filled 2022-07-30: qty 90, 90d supply, fill #0

## 2022-07-30 NOTE — Telephone Encounter (Signed)
From: Marlowe Aschoff To: Office of [name], NP Sent: 07/30/2022 2:17 PM EDT Subject: Medication Renewal Request  Refills have been requested for the following medications:   COMBIPATCH 0.05-0.14 MG/DAY  Preferred pharmacy: [facility] Delivery method: Daryll Drown

## 2022-08-01 ENCOUNTER — Other Ambulatory Visit: Payer: Self-pay

## 2022-08-03 ENCOUNTER — Encounter: Payer: 59 | Admitting: Primary Care

## 2022-08-06 ENCOUNTER — Other Ambulatory Visit: Payer: Self-pay

## 2022-08-14 ENCOUNTER — Telehealth: Payer: Self-pay | Admitting: Pharmacy Technician

## 2022-08-14 ENCOUNTER — Other Ambulatory Visit (HOSPITAL_COMMUNITY): Payer: Self-pay

## 2022-08-14 NOTE — Telephone Encounter (Signed)
LMOVM medication approved.

## 2022-08-14 NOTE — Telephone Encounter (Signed)
Status of PA in separate encounter 

## 2022-08-14 NOTE — Telephone Encounter (Signed)
Patient Advocate Encounter   Received notification that prior authorization for Ubrelvy  tablets is required.   PA submitted on 08/14/2022 [facility] Insurance [employer] ePA Form Status is pending

## 2022-08-14 NOTE — Telephone Encounter (Signed)
Patient Advocate Encounter  Prior Authorization for [name]  tablets has been approved through [employer].    Key: [id number]  Effective: 08-14-2022 to 02-13-2023

## 2022-08-15 ENCOUNTER — Other Ambulatory Visit: Payer: Self-pay

## 2022-08-16 ENCOUNTER — Other Ambulatory Visit: Payer: Self-pay

## 2022-08-17 ENCOUNTER — Encounter: Payer: 59 | Admitting: Primary Care

## 2022-08-23 ENCOUNTER — Other Ambulatory Visit: Payer: Self-pay

## 2022-08-27 ENCOUNTER — Other Ambulatory Visit: Payer: Self-pay | Admitting: Primary Care

## 2022-08-27 ENCOUNTER — Other Ambulatory Visit: Payer: Self-pay

## 2022-08-27 DIAGNOSIS — F4323 Adjustment disorder with mixed anxiety and depressed mood: Secondary | ICD-10-CM

## 2022-08-27 DIAGNOSIS — G4700 Insomnia, unspecified: Secondary | ICD-10-CM

## 2022-08-27 DIAGNOSIS — I10 Essential (primary) hypertension: Secondary | ICD-10-CM

## 2022-08-27 MED ORDER — TRAZODONE HCL 100 MG PO TABS
100.0000 mg | ORAL_TABLET | Freq: Every evening | ORAL | 0 refills | Status: DC
  Filled 2022-08-27: qty 90, 90d supply, fill #0

## 2022-08-27 MED ORDER — LOSARTAN POTASSIUM 100 MG PO TABS
100.0000 mg | ORAL_TABLET | Freq: Every day | ORAL | 0 refills | Status: DC
  Filled 2022-08-27: qty 90, 90d supply, fill #0

## 2022-08-28 ENCOUNTER — Other Ambulatory Visit: Payer: Self-pay

## 2022-08-28 MED ORDER — FLUOXETINE HCL 40 MG PO CAPS
40.0000 mg | ORAL_CAPSULE | Freq: Every day | ORAL | 0 refills | Status: DC
  Filled 2022-08-28: qty 90, 90d supply, fill #0

## 2022-09-14 ENCOUNTER — Ambulatory Visit (INDEPENDENT_AMBULATORY_CARE_PROVIDER_SITE_OTHER): Payer: 59 | Admitting: Primary Care

## 2022-09-14 ENCOUNTER — Encounter: Payer: Self-pay | Admitting: Primary Care

## 2022-09-14 VITALS — BP 108/62 | HR 60 | Temp 98.0°F | Ht 60.0 in | Wt 149.0 lb

## 2022-09-14 DIAGNOSIS — R7303 Prediabetes: Secondary | ICD-10-CM

## 2022-09-14 DIAGNOSIS — Z0000 Encounter for general adult medical examination without abnormal findings: Secondary | ICD-10-CM

## 2022-09-14 DIAGNOSIS — I10 Essential (primary) hypertension: Secondary | ICD-10-CM | POA: Diagnosis not present

## 2022-09-14 DIAGNOSIS — G43009 Migraine without aura, not intractable, without status migrainosus: Secondary | ICD-10-CM | POA: Diagnosis not present

## 2022-09-14 DIAGNOSIS — E785 Hyperlipidemia, unspecified: Secondary | ICD-10-CM

## 2022-09-14 DIAGNOSIS — G4700 Insomnia, unspecified: Secondary | ICD-10-CM | POA: Diagnosis not present

## 2022-09-14 DIAGNOSIS — F4323 Adjustment disorder with mixed anxiety and depressed mood: Secondary | ICD-10-CM

## 2022-09-14 LAB — HEMOGLOBIN A1C: Hgb A1c MFr Bld: 5.8 % (ref 4.6–6.5)

## 2022-09-14 LAB — COMPREHENSIVE METABOLIC PANEL
ALT: 21 U/L (ref 0–35)
AST: 19 U/L (ref 0–37)
Albumin: 4.7 g/dL (ref 3.5–5.2)
Alkaline Phosphatase: 39 U/L (ref 39–117)
BUN: 21 mg/dL (ref 6–23)
CO2: 31 mEq/L (ref 19–32)
Calcium: 10.2 mg/dL (ref 8.4–10.5)
Chloride: 102 mEq/L (ref 96–112)
Creatinine, Ser: 1.03 mg/dL (ref 0.40–1.20)
GFR: 61.18 mL/min (ref >60.00)
Glucose, Bld: 87 mg/dL (ref 70–99)
Potassium: 4.4 mEq/L (ref 3.5–5.1)
Sodium: 141 mEq/L (ref 135–145)
Total Bilirubin: 0.4 mg/dL (ref 0.2–1.2)
Total Protein: 7 g/dL (ref 6.0–8.3)

## 2022-09-14 LAB — LIPID PANEL
Cholesterol: 179 mg/dL (ref 0–200)
HDL: 38.7 mg/dL — ABNORMAL LOW (ref >39.00)
LDL Cholesterol: 111 mg/dL — ABNORMAL HIGH (ref 0–99)
NonHDL: 140.26
Total CHOL/HDL Ratio: 5
Triglycerides: 146 mg/dL (ref 0.0–149.0)
VLDL: 29.2 mg/dL (ref 0.0–40.0)

## 2022-09-14 MED ORDER — KETOROLAC TROMETHAMINE 60 MG/2ML IM SOLN
60.0000 mg | Freq: Once | INTRAMUSCULAR | Status: AC
  Administered 2022-09-14: 60 mg via INTRAMUSCULAR

## 2022-09-14 NOTE — Assessment & Plan Note (Signed)
Controlled.  Continue fluoxetine 40 mg daily. Continue Trazodone 100 mg HS.

## 2022-09-14 NOTE — Assessment & Plan Note (Addendum)
Improving.  Following with Neurology. Continue Emgality 120 mg once monthly. Continue propranolol ER 120 mg daily for prevention. Continue Ubrelvy 100 mg daily PRN.  IM Toradol 60 mg provided today for early migraine.

## 2022-09-14 NOTE — Assessment & Plan Note (Signed)
Repeat lipid panel pending.   Commended her on regular exercise.  

## 2022-09-14 NOTE — Addendum Note (Signed)
Addended by: Lonia Blood on: 09/14/2022 10:15 AM   Modules accepted: Orders

## 2022-09-14 NOTE — Addendum Note (Signed)
Addended by: [name] on: 09/14/2022 10:14 AM   Modules accepted: Level of Service

## 2022-09-14 NOTE — Assessment & Plan Note (Signed)
Controlled.  ? ?Continue losartan 100 mg daily. ? ?CMP pending. ?

## 2022-09-14 NOTE — Progress Notes (Addendum)
Subjective:    Patient ID: Krista Carpenter, female    DOB: [birth year], 57 y.o.   MRN: [id number]  HPI  Krista Carpenter is a very pleasant 57 y.o. female who presents today for complete physical and follow up of chronic conditions.  She does feel a migraine coming on today. Located to the bilateral temporal region with photophobia. Began this morning. She's not taken Ubrelvy yet.   Immunizations: -Tetanus: Completed in 2020 -Shingles: Completed Shingrix series  Diet: Fair diet.  Exercise: Exercising at the gym three times weekly   Eye exam: Completes annually  Dental exam: Completes semi-annually    Pap Smear: Follows with GYN, UTD Mammogram: Completes with GYN annually   Colonoscopy: Completed in 2020, due 2025  BP Readings from Last 3 Encounters:  09/14/22 108/62  07/13/22 112/70  05/28/22 108/60   Wt Readings from Last 3 Encounters:  09/14/22 149 lb (67.6 kg)  07/13/22 157 lb 2 oz (71.3 kg)  05/28/22 157 lb (71.2 kg)      Review of Systems  Constitutional:  Negative for unexpected weight change.  HENT:  Negative for rhinorrhea.   Respiratory:  Negative for cough and shortness of breath.   Cardiovascular:  Negative for chest pain.  Gastrointestinal:  Negative for constipation and diarrhea.  Genitourinary:  Negative for difficulty urinating.  Musculoskeletal:  Positive for arthralgias.  Skin:  Negative for rash.  Allergic/Immunologic: Positive for environmental allergies.  Neurological:  Positive for headaches. Negative for dizziness and numbness.  Psychiatric/Behavioral:  The patient is not nervous/anxious.          Past Medical History:  Diagnosis Date   Acute diverticulitis    Acute sinusitis 02/05/2020   AKI (acute kidney injury) (HCC) 03/02/2020   Community acquired pneumonia of right lower lobe of lung 03/11/2020   Diverticula of colon    sigmoid and descending colon   Diverticulitis 11/04/2018   History of abnormal cervical Pap smear    IBS  (irritable bowel syndrome)    Kidney infection 02/2020   Migraine    Pyelonephritis of left kidney 03/02/2020   Sepsis (HCC) 03/02/2020   Transaminitis 03/02/2020    Social History   Socioeconomic History   Marital status: Married    Spouse name: Not on file   Number of children: Not on file   Years of education: Not on file   Highest education level: Not on file  Occupational History   Not on file  Tobacco Use   Smoking status: Never   Smokeless tobacco: Never  Vaping Use   Vaping Use: Never used  Substance and Sexual Activity   Alcohol use: Yes    Alcohol/week: 0.0 standard drinks of alcohol    Comment: occasion   Drug use: No   Sexual activity: Yes    Partners: Male    Birth control/protection: I.U.D.  Other Topics Concern   Not on file  Social History Narrative   Married.   2 children.   Works at [employer].   Enjoys scrapbooking, reading.   Right handed   Social Determinants of Health   Financial Resource Strain: Not on file  Food Insecurity: Not on file  Transportation Needs: Not on file  Physical Activity: Not on file  Stress: Not on file  Social Connections: Not on file  Intimate Partner Violence: Not on file    Past Surgical History:  Procedure Laterality Date   CHOLECYSTECTOMY     colonoscopy     DILATION AND CURETTAGE  OF UTERUS     TONSILLECTOMY     WRIST ARTHROCENTESIS      Family History  Problem Relation Age of Onset   Cancer Mother        skin   Irritable bowel syndrome Mother    Diverticulosis Mother    Diabetes Father    Diabetes Brother    Diabetes Maternal Uncle     Allergies  Allergen Reactions   Oxycodone Itching    Current Outpatient Medications on File Prior to Visit  Medication Sig Dispense Refill   COMBIPATCH 0.05-0.14 MG/DAY Place 1 patch onto the skin 2 (two) times a week.     desonide (DESOWEN) 0.05 % cream Apply topically 2 (two) times daily.     FLUoxetine (PROZAC) 40 MG capsule Take 1 capsule (40 mg  total) by mouth daily for anxiety and depression 90 capsule 0   Galcanezumab-gnlm (EMGALITY) 120 MG/ML SOAJ Inject 120 mg into the skin every 30 (thirty) days. 1 mL 5   losartan (COZAAR) 100 MG tablet Take 1 tablet (100 mg total) by mouth daily. for blood pressure 90 tablet 0   PREMARIN vaginal cream Insert 0.5 applicatorful every day by vaginal route for two weeks then to twice a week     propranolol ER (INDERAL LA) 120 MG 24 hr capsule Take 1 capsule (120 mg total) by mouth daily for headache prevention. 90 capsule 0   traZODone (DESYREL) 100 MG tablet Take 1 tablet (100 mg total) by mouth at bedtime. For sleep 90 tablet 0   Ubrogepant (UBRELVY) 100 MG TABS Take 1 tablet (100 mg total) by mouth as needed. May repeat after 2 hours.  Maximum 2 tablets in 24 hours. 16 tablet 11   No current facility-administered medications on file prior to visit.    BP 108/62   Pulse 60   Temp 98 F (36.7 C) (Temporal)   Ht 5' (1.524 m)   Wt 149 lb (67.6 kg)   LMP 12/02/2012   SpO2 96%   BMI 29.10 kg/m  Objective:   Physical Exam HENT:     Right Ear: Tympanic membrane and ear canal normal.     Left Ear: Tympanic membrane and ear canal normal.     Nose: Nose normal.  Eyes:     Conjunctiva/sclera: Conjunctivae normal.     Pupils: Pupils are equal, round, and reactive to light.  Neck:     Thyroid: No thyromegaly.  Cardiovascular:     Rate and Rhythm: Normal rate and regular rhythm.     Heart sounds: No murmur heard. Pulmonary:     Effort: Pulmonary effort is normal.     Breath sounds: Normal breath sounds. No rales.  Abdominal:     General: Bowel sounds are normal.     Palpations: Abdomen is soft.     Tenderness: There is no abdominal tenderness.  Musculoskeletal:        General: Normal range of motion.     Cervical back: Neck supple.  Lymphadenopathy:     Cervical: No cervical adenopathy.  Skin:    General: Skin is warm and dry.     Findings: No rash.  Neurological:     Mental Status:  She is alert and oriented to person, place, and time.     Cranial Nerves: No cranial nerve deficit.     Deep Tendon Reflexes: Reflexes are normal and symmetric.  Psychiatric:        Mood and Affect: Mood normal.  Assessment & Plan:  Preventative health care Assessment & Plan: Immunizations UTD. Pap smear UTD. Follows with GYN Mammogram due, she has this scheduled at her GYN office Colonoscopy UTD, due 2025  Discussed the importance of a healthy diet and regular exercise in order for weight loss, and to reduce the risk of further co-morbidity.  Exam stable. Labs pending.  Follow up in 1 year for repeat physical.    Essential hypertension Assessment & Plan: Controlled.  Continue losartan 100 mg daily. CMP pending.   Migraine without aura and without status migrainosus, not intractable Assessment & Plan: Improving.  Following with Neurology. Continue Emgality 120 mg once monthly. Continue propranolol ER 120 mg daily for prevention. Continue Ubrelvy 100 mg daily PRN.   Adjustment disorder with mixed anxiety and depressed mood Assessment & Plan: Controlled.  Continue fluoxetine 40 mg daily. Continue Trazodone 100 mg HS.   Insomnia, unspecified type Assessment & Plan: Controlled.  Continue Trazodone 100 mg HS.   Prediabetes Assessment & Plan: Repeat A1C pending.  Discussed the importance of a healthy diet and regular exercise in order for weight loss, and to reduce the risk of further co-morbidity. Commended her on regular exercise.   Orders: -     Hemoglobin A1c  Hyperlipidemia, unspecified hyperlipidemia type Assessment & Plan: Repeat lipid panel pending. Commended her on regular exercise!  Orders: -     Lipid panel -     Comprehensive metabolic panel        [name], NP

## 2022-09-14 NOTE — Patient Instructions (Signed)
Stop by the lab prior to leaving today. I will notify you of your results once received.   It was a pleasure to see you today!  

## 2022-09-14 NOTE — Assessment & Plan Note (Signed)
Controlled.  Continue Trazodone 100 mg HS. 

## 2022-09-14 NOTE — Assessment & Plan Note (Signed)
Repeat A1C pending.  Discussed the importance of a healthy diet and regular exercise in order for weight loss, and to reduce the risk of further co-morbidity. Commended her on regular exercise.

## 2022-09-14 NOTE — Assessment & Plan Note (Signed)
Immunizations UTD. Pap smear UTD. Follows with GYN Mammogram due, she has this scheduled at her GYN office Colonoscopy UTD, due 2025  Discussed the importance of a healthy diet and regular exercise in order for weight loss, and to reduce the risk of further co-morbidity.  Exam stable. Labs pending.  Follow up in 1 year for repeat physical.

## 2022-09-18 ENCOUNTER — Other Ambulatory Visit: Payer: Self-pay

## 2022-09-18 MED ORDER — PREMARIN 0.625 MG/GM VA CREA
1.0000 g | TOPICAL_CREAM | VAGINAL | 0 refills | Status: DC
  Filled 2022-09-18: qty 30, 90d supply, fill #0

## 2022-09-18 MED ORDER — COMBIPATCH 0.05-0.14 MG/DAY TD PTTW
MEDICATED_PATCH | TRANSDERMAL | 0 refills | Status: DC
  Filled 2022-09-18 (×2): qty 8, 28d supply, fill #0
  Filled 2022-10-20: qty 8, 28d supply, fill #1
  Filled 2022-11-27: qty 8, 28d supply, fill #2

## 2022-09-20 ENCOUNTER — Other Ambulatory Visit: Payer: Self-pay

## 2022-09-28 ENCOUNTER — Ambulatory Visit: Payer: 59 | Admitting: Neurology

## 2022-10-02 ENCOUNTER — Other Ambulatory Visit: Payer: Self-pay

## 2022-10-03 ENCOUNTER — Other Ambulatory Visit: Payer: Self-pay

## 2022-10-21 ENCOUNTER — Other Ambulatory Visit: Payer: Self-pay

## 2022-10-26 ENCOUNTER — Other Ambulatory Visit: Payer: Self-pay

## 2022-10-26 ENCOUNTER — Other Ambulatory Visit: Payer: Self-pay | Admitting: Primary Care

## 2022-10-26 DIAGNOSIS — R519 Headache, unspecified: Secondary | ICD-10-CM

## 2022-10-26 MED ORDER — PROPRANOLOL HCL ER 120 MG PO CP24
120.0000 mg | ORAL_CAPSULE | Freq: Every day | ORAL | 2 refills | Status: DC
  Filled 2022-10-26: qty 90, 90d supply, fill #0
  Filled 2023-01-29: qty 90, 90d supply, fill #1
  Filled 2023-06-05: qty 90, 90d supply, fill #2

## 2022-10-29 ENCOUNTER — Other Ambulatory Visit: Payer: Self-pay

## 2022-11-20 NOTE — Progress Notes (Unsigned)
NEUROLOGY FOLLOW UP OFFICE NOTE  [name] [id number]  Assessment/Plan:   Chronic migraine without aura, without status migrainosus, not intractable, probably aggravated by weather   Toradol 60mg  inj today. Migraine prevention:  Emgality Q4wks and propranolol ER 120mg  daily.  Add topiramate for synergistic effect, titrating to 50mg  at bedtime.  Titrate to 100mg  at bedtime in 4-5 weeks if needed.  If ineffective, plan to switch to Vyepti 300mg  Migraine rescue:  [name] 100mg  Limit use of pain relievers to no more than 2 days out of week to prevent risk of rebound or medication-overuse headache. Keep headache diary Follow up 5 months.       Subjective:  Krista Carpenter is a 57 year old right-handed female with IBS who follows up for migraine.  UPDATE: Started Emgality in February.   [name] more effective than Nurtec for acute therapy. Helpful initially.  Maybe only 1 or 2 a month.  Started getting more frequent in June.  May be the weather.   Intensity:  severe Duration:  off and on throughout day with [name] (needs to repeat dose) Frequency:  now near daily Current NSAIDS/analgesics:  none Current triptans:  none Current ergotamine:  none Current anti-emetic:  none Current muscle relaxants:  none Current Antihypertensive medications:  propranolol ER 120mg  daily, losartan Current Antidepressant medications:  fluoxetine, trazodone (insomnia) Current Anticonvulsant medications:  none Current anti-CGRP:  Emgality, Ubrelvy 100mg  Current Vitamins/Herbal/Supplements:  none Current Antihistamines/Decongestants:  none Other therapy:  none     Caffeine:  1 cup coffee daily (sometimes 2). Diet:  water.  Rarely soda.  Does not skip meals Exercise:  not routine Depression:  stable; Anxiety:  stable Other pain:  no Sleep hygiene:  okay with trazodone  HISTORY:  Onset:  since she was young.  Became worse after having her second child in 1999  Location:  varies.  Mostly  across forehead and temples bilateral; occipital/base of skull and down neck bilateral Quality:  pressure, throbbing, pounding Intensity:  moderate to severe.   Aura:  absent Prodrome:  absent Associated symptoms:  Floaters and blurred vision (all the time), photophobia, phonophobia, infrequent nausea.  She denies associated unilateral numbness or weakness. Duration:  several hours, sometimes 1-2 days, rarely 5 days Frequency:  27-28 days a month (up to 5 days a month is severe).  Often occurs after lunch or wakes up with it Frequency of abortive medication: Tylenol or Advil daily (for years) Triggers:  change in weather, MSG, looking at the computer screen Relieving factors:  laying down, ice pack Activity:  movement aggravates.  Up to 5 days a month difficult to function   Prior workup for headaches included MRI of brain from 12/04/2000 which was negative.     Past NSAIDS/analgesics:  ibuprofen, acetaminophen, celecoxib, meloxicam, ketorolac IM Past abortive triptans:  sumatriptan (did not tolerate), rizatriptan (did not tolerate), eletriptan Past abortive ergotamine:  none Past muscle relaxants:  orphenadrine Past anti-emetic:  ondansetron, promethazine Past antihypertensive medications:  none Past antidepressant medications:  amitriptyline (side effects), sertraline Past anticonvulsant medications:  topiramate 50mg  TID Past anti-CGRP:  Nurtec Past vitamins/Herbal/Supplements:  none Past antihistamines/decongestants:  none Other past therapies:  trigger point injections    Family history of headache:  mom (migraines), maternal aunt (migraines)  PAST MEDICAL HISTORY: Past Medical History:  Diagnosis Date   Acute diverticulitis    Acute sinusitis 02/05/2020   AKI (acute kidney injury) (HCC) 03/02/2020   Community acquired pneumonia of right lower lobe of lung 03/11/2020  Diverticula of colon    sigmoid and descending colon   Diverticulitis 11/04/2018   History of abnormal  cervical Pap smear    IBS (irritable bowel syndrome)    Kidney infection 02/2020   Migraine    Pyelonephritis of left kidney 03/02/2020   Sepsis (HCC) 03/02/2020   Transaminitis 03/02/2020    MEDICATIONS: Current Outpatient Medications on File Prior to Visit  Medication Sig Dispense Refill   COMBIPATCH 0.05-0.14 MG/DAY Place 1 patch onto the skin 2 (two) times a week.     conjugated estrogens (PREMARIN) vaginal cream Place 0.5 Applicatorfuls vaginally 2 (two) times a week. 30 g 0   desonide (DESOWEN) 0.05 % cream Apply topically 2 (two) times daily.     estradiol-norethindrone (COMBIPATCH) 0.05-0.14 MG/DAY Apply 1 patch transdermally twice a week. 24 patch 0   FLUoxetine (PROZAC) 40 MG capsule Take 1 capsule (40 mg total) by mouth daily for anxiety and depression 90 capsule 0   Galcanezumab-gnlm (EMGALITY) 120 MG/ML SOAJ Inject 120 mg into the skin every 30 (thirty) days. 1 mL 5   losartan (COZAAR) 100 MG tablet Take 1 tablet (100 mg total) by mouth daily. for blood pressure 90 tablet 0   PREMARIN vaginal cream Insert 0.5 applicatorful every day by vaginal route for two weeks then to twice a week     propranolol ER (INDERAL LA) 120 MG 24 hr capsule Take 1 capsule (120 mg total) by mouth daily for headache prevention. 90 capsule 2   traZODone (DESYREL) 100 MG tablet Take 1 tablet (100 mg total) by mouth at bedtime. For sleep 90 tablet 0   Ubrogepant (UBRELVY) 100 MG TABS Take 1 tablet (100 mg total) by mouth as needed. May repeat after 2 hours.  Maximum 2 tablets in 24 hours. 16 tablet 11   No current facility-administered medications on file prior to visit.    ALLERGIES: Allergies  Allergen Reactions   Oxycodone Itching    FAMILY HISTORY: Family History  Problem Relation Age of Onset   Cancer Mother        skin   Irritable bowel syndrome Mother    Diverticulosis Mother    Diabetes Father    Diabetes Brother    Diabetes Maternal Uncle       Objective:  Blood pressure  129/82, pulse 62, height 5' (1.524 m), weight 147 lb 3.2 oz (66.8 kg), last menstrual period 12/02/2012, SpO2 97%. General: No acute distress.  Patient appears well-groomed.    Shon Millet, DO  CC: Vernona Rieger, NP

## 2022-11-21 ENCOUNTER — Ambulatory Visit (INDEPENDENT_AMBULATORY_CARE_PROVIDER_SITE_OTHER): Payer: 59 | Admitting: Neurology

## 2022-11-21 ENCOUNTER — Other Ambulatory Visit: Payer: Self-pay

## 2022-11-21 ENCOUNTER — Encounter: Payer: Self-pay | Admitting: Neurology

## 2022-11-21 VITALS — BP 129/82 | HR 62 | Ht 60.0 in | Wt 147.2 lb

## 2022-11-21 DIAGNOSIS — G43709 Chronic migraine without aura, not intractable, without status migrainosus: Secondary | ICD-10-CM

## 2022-11-21 MED ORDER — KETOROLAC TROMETHAMINE 60 MG/2ML IM SOLN
60.0000 mg | Freq: Once | INTRAMUSCULAR | Status: AC
Start: 2022-11-21 — End: 2022-11-21
  Administered 2022-11-21: 60 mg via INTRAMUSCULAR

## 2022-11-21 MED ORDER — TOPIRAMATE 25 MG PO TABS
ORAL_TABLET | ORAL | 0 refills | Status: DC
  Filled 2022-11-21 (×2): qty 60, 30d supply, fill #0

## 2022-11-21 NOTE — Patient Instructions (Signed)
Continue Emgality. Start topiramate 25mg  at bedtime for one week, then increase to 50mg  at bedtime.  If no improvement by time of refill, contact me and we can increase dose. Take Ubrelvy at earliest onset of migraine.  Repeat after 2  hours if headache persists/returns.  Maximum 2 tablets in 24 hours.   Alternatively, take Zavzpret nasal spray earliest onset of migraine (1 in 24 hours).  Let me know if either effective, otherwise we will try something else

## 2022-11-21 NOTE — Progress Notes (Signed)
Medication Samples have been provided to the patient.  Drug name: Zavzpret       Strength: 10 mg        Qty: 2  LOT: [id number]  Exp.Date: 9/25  Dosing instructions: as needed  The patient has been instructed regarding the correct time, dose, and frequency of taking this medication, including desired effects and most common side effects.   Leida Lauth 11:21 AM 11/21/2022

## 2022-11-26 ENCOUNTER — Other Ambulatory Visit: Payer: Self-pay

## 2022-11-26 DIAGNOSIS — N951 Menopausal and female climacteric states: Secondary | ICD-10-CM | POA: Diagnosis not present

## 2022-11-26 DIAGNOSIS — F329 Major depressive disorder, single episode, unspecified: Secondary | ICD-10-CM | POA: Diagnosis not present

## 2022-11-26 DIAGNOSIS — Z1231 Encounter for screening mammogram for malignant neoplasm of breast: Secondary | ICD-10-CM | POA: Diagnosis not present

## 2022-11-26 DIAGNOSIS — I10 Essential (primary) hypertension: Secondary | ICD-10-CM | POA: Diagnosis not present

## 2022-11-26 DIAGNOSIS — Z01419 Encounter for gynecological examination (general) (routine) without abnormal findings: Secondary | ICD-10-CM | POA: Diagnosis not present

## 2022-11-26 DIAGNOSIS — Z6829 Body mass index (BMI) 29.0-29.9, adult: Secondary | ICD-10-CM | POA: Diagnosis not present

## 2022-11-26 DIAGNOSIS — N952 Postmenopausal atrophic vaginitis: Secondary | ICD-10-CM | POA: Diagnosis not present

## 2022-11-26 MED ORDER — PREMARIN 0.625 MG/GM VA CREA
0.5000 | TOPICAL_CREAM | VAGINAL | 2 refills | Status: AC
  Filled 2022-11-26: qty 30, 90d supply, fill #0
  Filled 2023-07-26: qty 30, 30d supply, fill #0

## 2022-11-26 MED ORDER — COMBIPATCH 0.05-0.14 MG/DAY TD PTTW
1.0000 | MEDICATED_PATCH | TRANSDERMAL | 3 refills | Status: DC
  Filled 2022-11-26: qty 24, 84d supply, fill #0

## 2022-11-27 ENCOUNTER — Other Ambulatory Visit: Payer: Self-pay | Admitting: Primary Care

## 2022-11-27 ENCOUNTER — Other Ambulatory Visit: Payer: Self-pay

## 2022-11-27 DIAGNOSIS — G4700 Insomnia, unspecified: Secondary | ICD-10-CM

## 2022-11-27 DIAGNOSIS — I10 Essential (primary) hypertension: Secondary | ICD-10-CM

## 2022-11-27 DIAGNOSIS — F4323 Adjustment disorder with mixed anxiety and depressed mood: Secondary | ICD-10-CM

## 2022-11-27 MED FILL — Trazodone HCl Tab 100 MG: ORAL | 90 days supply | Qty: 90 | Fill #0 | Status: AC

## 2022-11-27 MED FILL — Losartan Potassium Tab 100 MG: ORAL | 90 days supply | Qty: 90 | Fill #0 | Status: AC

## 2022-11-27 MED FILL — Fluoxetine HCl Cap 40 MG: ORAL | 90 days supply | Qty: 90 | Fill #0 | Status: AC

## 2022-11-28 ENCOUNTER — Other Ambulatory Visit: Payer: Self-pay

## 2022-11-29 ENCOUNTER — Other Ambulatory Visit: Payer: Self-pay

## 2022-11-30 ENCOUNTER — Other Ambulatory Visit (HOSPITAL_COMMUNITY): Payer: Self-pay

## 2022-11-30 ENCOUNTER — Telehealth: Payer: Self-pay | Admitting: Pharmacy Technician

## 2022-12-04 ENCOUNTER — Other Ambulatory Visit: Payer: Self-pay

## 2022-12-04 ENCOUNTER — Other Ambulatory Visit (HOSPITAL_COMMUNITY): Payer: Self-pay

## 2022-12-17 ENCOUNTER — Encounter: Payer: Self-pay | Admitting: Family Medicine

## 2022-12-17 ENCOUNTER — Other Ambulatory Visit: Payer: Self-pay

## 2022-12-17 ENCOUNTER — Ambulatory Visit: Payer: 59 | Admitting: Family Medicine

## 2022-12-17 VITALS — BP 126/74 | Temp 98.3°F | Ht 60.0 in | Wt 150.0 lb

## 2022-12-17 DIAGNOSIS — K5792 Diverticulitis of intestine, part unspecified, without perforation or abscess without bleeding: Secondary | ICD-10-CM | POA: Diagnosis not present

## 2022-12-17 MED ORDER — HYDROCODONE-ACETAMINOPHEN 10-325 MG PO TABS
1.0000 | ORAL_TABLET | Freq: Three times a day (TID) | ORAL | 0 refills | Status: AC | PRN
Start: 2022-12-17 — End: 2022-12-23
  Filled 2022-12-17: qty 15, 5d supply, fill #0

## 2022-12-17 NOTE — Assessment & Plan Note (Signed)
Based on history and her exam, this episode of LLQ pain is consistent with diverticulitis. Her vital signs are normal, suggesting this is a mild episode. I recommend outpatient treatment with a clear liquid diet for several days and pain management. If her symptoms worsening, she should consider urgent reassessment. We did discuss the lack of evidence for antibiotics in mild to moderate diverticulitis. I will check a UA to make sure she does not have a UTI in light of her past history.

## 2022-12-17 NOTE — Patient Instructions (Signed)
Diverticulitis  Diverticulitis happens when poop (stool) and bacteria get trapped in small pouches in the colon called diverticula. These pouches may form if you have a condition called diverticulosis. When the poop and bacteria get trapped, it can cause an infection and inflammation. Diverticulitis may cause severe stomach pain and diarrhea. It can also lead to tissue damage in your colon. This can cause bleeding or blockage. In some cases, the diverticula may burst (rupture). This can cause infected poop to go into other parts of your abdomen. What are the causes? This condition is caused by poop getting trapped in the diverticula. This allows bacteria to grow. It can lead to inflammation and infection. What increases the risk? You are more likely to get this condition if you have diverticulosis. You are also more at risk if: You are overweight or obese. You do not get enough exercise. You drink alcohol. You smoke. You eat a lot of red meat, such as beef, pork, or lamb. You do not get enough fiber. Foods high in fiber include fruits, vegetables, beans, nuts, and whole grains. You are over 35 years of age. What are the signs or symptoms? Symptoms of this condition may include: Pain and tenderness in the abdomen. This pain is often felt on the left side but may occur in other spots. Fever and chills. Nausea and vomiting. Cramping. Bloating. Changes in how often you poop. Blood in your poop. How is this diagnosed? This condition is diagnosed based on your medical history and a physical exam. You may also have tests done to make sure there is nothing else causing your condition. These tests may include: Blood tests. Tests done on your pee (urine). A CT scan of the abdomen. You may need to have a colonoscopy. This is an exam to look at your whole large intestine. During the exam, a tube is put into the opening of your butt (anus) and then moved into your rectum, colon, and other parts of  the large intestine. This exam is done to look at the diverticula. It can also see if there is something else that may be causing your symptoms. How is this treated? Most cases are mild and can be treated at home. You may be told to: Take over-the-counter pain medicine. Only eat and drink clear liquids. Take antibiotics. Rest. More severe cases may need to be treated at a hospital. Treatment may include: Not eating or drinking. Taking pain medicines. Getting antibiotics through an IV. Getting fluids and nutrition through an IV. Surgery. Follow these instructions at home: Medicines Take over-the-counter and prescription medicines only as told by your health care provider. These include fiber supplements, probiotics, and medicines to soften your poop (stool softeners). If you were prescribed antibiotics, take them as told by your provider. Do not stop using the antibiotic even if you start to feel better. Ask your provider if the medicine prescribed to you requires you to avoid driving or using machinery. Eating and drinking  Follow the diet told by your provider. You may need to only eat and drink liquids. After your symptoms get better, you may be able to return to a more normal diet. You may be told to eat at least 25 grams (25 g) of fiber each day. Fiber makes it easier to poop. Healthy sources of fiber include: Berries. One cup has 4-8 g of fiber. Beans or lentils. One-half cup has 5-8 g of fiber. Green vegetables. One cup has 4 g of fiber. Avoid eating red meat.  General instructions Do not use any products that contain nicotine or tobacco. These products include cigarettes, chewing tobacco, and vaping devices, such as e-cigarettes. If you need help quitting, ask your provider. Exercise for at least 30 minutes, 3 times a week. Exercise hard enough to raise your heart rate and break a sweat. Contact a health care provider if: Your pain gets worse. Your pooping does not go back to  normal. Your symptoms do not get better with treatment. Your symptoms get worse all of a sudden. You have a fever. You vomit more than one time. Your poop is bloody, black, or tarry. This information is not intended to replace advice given to you by your health care provider. Make sure you discuss any questions you have with your health care provider. Document Revised: 01/04/2022 Document Reviewed: 01/04/2022 Elsevier Patient Education  2024 [employer].

## 2022-12-17 NOTE — Progress Notes (Signed)
[facility] [address] [place] [address] Dept: [phone] Dept Fax: [phone]  Office Visit  Subjective:    Patient ID: Krista Carpenter, female    DOB: 1965/06/19, 57 y.o..   MRN: [id number]  Chief Complaint  Patient presents with   Abdominal Pain    C/o having LT lower abdominal pain x 1 day. Last week every time she eats things go straight through her.    History of Present Illness:  Patient is in today for with some intermittent lower left quadrant pain over the past week. This has occurred briefly each day. However, in the past 24 hours, the pain has been stronger. It runs from near her pubis around to her left flank. She has had some associated diarrhea. The pain does seem to occur after she eats a meal. She denies any hematochezia. She finds her appetite has been decreased. She denies any dysuria or fever. She has a past history of diverticulitis on 3 occasions, the last being about 1 year ago. She also has a past history of a complicated UTI which turned into sepsis.  Past Medical History: Patient Active Problem List   Diagnosis Date Noted   Diverticulosis of colon 11/27/2021   Low back pain 04/18/2020   Iron deficiency anemia 03/11/2020   Insomnia 06/12/2019   Essential hypertension 05/08/2019   Trigger thumb of left hand 12/01/2018   Preventative health care 05/20/2018   Prediabetes 05/20/2018   Hyperlipidemia 05/20/2018   Carpal tunnel syndrome of left wrist 05/22/2017   Adjustment disorder with mixed anxiety and depressed mood 05/31/2015   Overweight(278.02) 12/18/2011   Migraine 05/08/2011   Past Surgical History:  Procedure Laterality Date   CHOLECYSTECTOMY     colonoscopy     DILATION AND CURETTAGE OF UTERUS     TONSILLECTOMY     WRIST ARTHROCENTESIS     Family History  Problem Relation Age of Onset   Cancer Mother        skin   Irritable bowel syndrome Mother    Diverticulosis Mother     Diabetes Father    Diabetes Brother    Diabetes Maternal Uncle    Outpatient Medications Prior to Visit  Medication Sig Dispense Refill   conjugated estrogens (PREMARIN) vaginal cream Place 0.5 Applicatorfuls vaginally 2 (two) times a week. 30 g 2   FLUoxetine (PROZAC) 40 MG capsule Take 1 capsule (40 mg total) by mouth daily for anxiety and depression 90 capsule 2   Galcanezumab-gnlm (EMGALITY) 120 MG/ML SOAJ Inject 120 mg into the skin every 30 (thirty) days. 1 mL 5   losartan (COZAAR) 100 MG tablet Take 1 tablet (100 mg total) by mouth daily. for blood pressure 90 tablet 2   PREMARIN vaginal cream Insert 0.5 applicatorful every day by vaginal route for two weeks then to twice a week     propranolol ER (INDERAL LA) 120 MG 24 hr capsule Take 1 capsule (120 mg total) by mouth daily for headache prevention. 90 capsule 2   topiramate (TOPAMAX) 25 MG tablet Take 1 tablet at bedtime for one week, then increase to 2 tablets at bedtime 60 tablet 0   traZODone (DESYREL) 100 MG tablet Take 1 tablet (100 mg total) by mouth at bedtime. For sleep 90 tablet 2   Ubrogepant (UBRELVY) 100 MG TABS Take 1 tablet (100 mg total) by mouth as needed. May repeat after 2 hours.  Maximum 2 tablets in 24 hours. 16 tablet 11  COMBIPATCH 0.05-0.14 MG/DAY Place 1 patch onto the skin 2 (two) times a week.     desonide (DESOWEN) 0.05 % cream Apply topically 2 (two) times daily.     estradiol-norethindrone (COMBIPATCH) 0.05-0.14 MG/DAY Apply 1 patch transdermally twice a week. 24 patch 0   estradiol-norethindrone (COMBIPATCH) 0.05-0.14 MG/DAY Place 1 patch onto the skin 2 (two) times a week. 24 patch 3   No facility-administered medications prior to visit.   Allergies  Allergen Reactions   Oxycodone Itching     Objective:   Today's Vitals   12/17/22 1559  BP: 126/74  Temp: 98.3 F (36.8 C)  TempSrc: Temporal  Weight: 150 lb (68 kg)  Height: 5' (1.524 m)   Body mass index is 29.29 kg/m.   General: Well  developed, well nourished. No acute distress. Abdomen: Soft. Bowel sounds positive, normal pitch and frequency. No hepatosplenomegaly. Tenderness in the left   lower quadrant. No rebound or guarding. Psych: Alert and oriented. Normal mood and affect.  Health Maintenance Due  Topic Date Due   HIV Screening  Never done   PAP SMEAR-Modifier  05/06/2022     Assessment & Plan:   Problem List Items Addressed This Visit       Digestive   Acute diverticulitis - Primary    Based on history and her exam, this episode of LLQ pain is consistent with diverticulitis. Her vital signs are normal, suggesting this is a mild episode. I recommend outpatient treatment with a clear liquid diet for several days and pain management. If her symptoms worsening, she should consider urgent reassessment. We did discuss the lack of evidence for antibiotics in mild to moderate diverticulitis. I will check a UA to make sure she does not have a UTI in light of her past history.      Relevant Medications   HYDROcodone-acetaminophen (NORCO) 10-325 MG tablet   Other Relevant Orders   Urinalysis, Routine w reflex microscopic    Return in about 1 week (around 12/24/2022) for Reassessment with PCP.   Loyola Mast, MD

## 2022-12-18 LAB — URINALYSIS, ROUTINE W REFLEX MICROSCOPIC
Bilirubin Urine: NEGATIVE
Ketones, ur: NEGATIVE
Leukocytes,Ua: NEGATIVE
Nitrite: NEGATIVE
Specific Gravity, Urine: 1.01 (ref 1.000–1.030)
Total Protein, Urine: NEGATIVE
Urine Glucose: NEGATIVE
Urobilinogen, UA: 0.2 (ref 0.0–1.0)
pH: 7 (ref 5.0–8.0)

## 2022-12-26 ENCOUNTER — Other Ambulatory Visit: Payer: Self-pay | Admitting: Neurology

## 2022-12-26 ENCOUNTER — Other Ambulatory Visit: Payer: Self-pay

## 2022-12-26 MED ORDER — TOPIRAMATE 25 MG PO TABS
ORAL_TABLET | ORAL | 0 refills | Status: DC
  Filled 2022-12-26: qty 60, 33d supply, fill #0

## 2022-12-28 ENCOUNTER — Other Ambulatory Visit: Payer: Self-pay

## 2022-12-28 ENCOUNTER — Ambulatory Visit: Payer: 59 | Admitting: Primary Care

## 2022-12-28 ENCOUNTER — Encounter: Payer: Self-pay | Admitting: Primary Care

## 2022-12-28 VITALS — BP 126/78 | HR 57 | Temp 97.4°F | Ht 60.0 in | Wt 149.0 lb

## 2022-12-28 DIAGNOSIS — R3129 Other microscopic hematuria: Secondary | ICD-10-CM

## 2022-12-28 DIAGNOSIS — K5792 Diverticulitis of intestine, part unspecified, without perforation or abscess without bleeding: Secondary | ICD-10-CM

## 2022-12-28 LAB — POC URINALSYSI DIPSTICK (AUTOMATED)
Bilirubin, UA: NEGATIVE
Blood, UA: POSITIVE
Glucose, UA: NEGATIVE
Ketones, UA: NEGATIVE
Leukocytes, UA: NEGATIVE
Nitrite, UA: NEGATIVE
Protein, UA: NEGATIVE
Spec Grav, UA: 1.01 (ref 1.010–1.025)
Urobilinogen, UA: 0.2 U/dL — AB
pH, UA: 6 (ref 5.0–8.0)

## 2022-12-28 MED ORDER — AMOXICILLIN-POT CLAVULANATE 875-125 MG PO TABS
1.0000 | ORAL_TABLET | Freq: Two times a day (BID) | ORAL | 0 refills | Status: DC
Start: 2022-12-28 — End: 2023-05-07
  Filled 2022-12-28: qty 14, 7d supply, fill #0

## 2022-12-28 NOTE — Assessment & Plan Note (Signed)
Symptoms and presentation today consistent.  Given lack of improvement despite conservative treatment, we will proceed with antibiotic treatment.  She agrees.  She is a difficult time tolerating ciprofloxacin/metronidazole course so we will proceed with Augmentin.  Start Augmentin antibiotics. Take 1 tablet by mouth twice daily for 7 days. We discussed clear liquid diet/very bland diet.  ED precautions provided.

## 2022-12-28 NOTE — Progress Notes (Signed)
Subjective:    Patient ID: Krista Carpenter, female    DOB: 19-Mar-1966, 57 y.o.   MRN: [id number]  HPI  Krista Carpenter is a very pleasant 57 y.o. female with a history of descending and sigmoid diverticulosis with acute descending and sigmoid diverticulitis, migraines, hypertension, prediabetes, hyperlipidemia, urosepsis, iron deficiency anemia who presents today for follow-up of acute diverticulitis.  Evaluated by Dr. Darrol Jump on 12/17/2022 for a several day history of intermittent, left, lower abdominal quadrant pain that was worse after eating.  Over the past 24 hours her pain had progressed and was experiencing some diarrhea.  During this visit it was presumed that she had acute diverticulitis so she was treated with Norco 10-325 mg and a clear liquid diet.  Urinalysis was negative for acute infection.  Since her visit with Dr. Veto Kemps, she's noticed itching with the Norco. She noticed temporary improvement in her pain. She completed a clear liquid diet for about 3 days. She resume solid food one week ago with a BBQ sandwich which caused diarrhea. She's continued to notice LLQ abdominal pain intermittently since for which she describes as "twinges".   She denies nausea, vomiting, dysuria, urinary frequency. Overall she's feeling about the same. She will feel better for a a while, then feel back to how she felt previously.    Review of Systems  Constitutional:  Negative for diaphoresis and fever.  Gastrointestinal:  Positive for abdominal pain and diarrhea.  Genitourinary:  Negative for dysuria and urgency.         Past Medical History:  Diagnosis Date   Acute diverticulitis    Acute sinusitis 02/05/2020   AKI (acute kidney injury) (HCC) 03/02/2020   Community acquired pneumonia of right lower lobe of lung 03/11/2020   Diverticula of colon    sigmoid and descending colon   Diverticulitis 11/04/2018   History of abnormal cervical Pap smear    IBS (irritable bowel syndrome)    Kidney  infection 02/2020   Migraine    Pyelonephritis of left kidney 03/02/2020   Sepsis (HCC) 03/02/2020   Transaminitis 03/02/2020    Social History   Socioeconomic History   Marital status: Married    Spouse name: Not on file   Number of children: Not on file   Years of education: Not on file   Highest education level: Not on file  Occupational History   Not on file  Tobacco Use   Smoking status: Never   Smokeless tobacco: Never  Vaping Use   Vaping status: Never Used  Substance and Sexual Activity   Alcohol use: Yes    Alcohol/week: 0.0 standard drinks of alcohol    Comment: occasion   Drug use: No   Sexual activity: Yes    Partners: Male    Birth control/protection: I.U.D.  Other Topics Concern   Not on file  Social History Narrative   Married.   2 children.   Works at [employer].   Enjoys scrapbooking, reading.   Right handed   Social Determinants of Health   Financial Resource Strain: Not on file  Food Insecurity: Not on file  Transportation Needs: Not on file  Physical Activity: Not on file  Stress: Not on file  Social Connections: Not on file  Intimate Partner Violence: Not on file    Past Surgical History:  Procedure Laterality Date   CHOLECYSTECTOMY     colonoscopy     DILATION AND CURETTAGE OF UTERUS     TONSILLECTOMY  WRIST ARTHROCENTESIS      Family History  Problem Relation Age of Onset   Cancer Mother        skin   Irritable bowel syndrome Mother    Diverticulosis Mother    Diabetes Father    Diabetes Brother    Diabetes Maternal Uncle     Allergies  Allergen Reactions   Hydrocodone Itching   Oxycodone Itching    Current Outpatient Medications on File Prior to Visit  Medication Sig Dispense Refill   conjugated estrogens (PREMARIN) vaginal cream Place 0.5 Applicatorfuls vaginally 2 (two) times a week. 30 g 2   FLUoxetine (PROZAC) 40 MG capsule Take 1 capsule (40 mg total) by mouth daily for anxiety and depression 90 capsule 2    Galcanezumab-gnlm (EMGALITY) 120 MG/ML SOAJ Inject 120 mg into the skin every 30 (thirty) days. 1 mL 5   losartan (COZAAR) 100 MG tablet Take 1 tablet (100 mg total) by mouth daily. for blood pressure 90 tablet 2   PREMARIN vaginal cream Insert 0.5 applicatorful every day by vaginal route for two weeks then to twice a week     propranolol ER (INDERAL LA) 120 MG 24 hr capsule Take 1 capsule (120 mg total) by mouth daily for headache prevention. 90 capsule 2   topiramate (TOPAMAX) 25 MG tablet Take 1 tablet (25 mg total) by mouth at bedtime for 7 days, THEN 2 tablets (50 mg total) at bedtime 60 tablet 0   traZODone (DESYREL) 100 MG tablet Take 1 tablet (100 mg total) by mouth at bedtime. For sleep 90 tablet 2   Ubrogepant (UBRELVY) 100 MG TABS Take 1 tablet (100 mg total) by mouth as needed. May repeat after 2 hours.  Maximum 2 tablets in 24 hours. 16 tablet 11   No current facility-administered medications on file prior to visit.    BP (!) 146/80   Pulse (!) 57   Temp (!) 97.4 F (36.3 C) (Temporal)   Ht 5' (1.524 m)   Wt 149 lb (67.6 kg)   LMP 12/02/2012   SpO2 97%   BMI 29.10 kg/m  Objective:   Physical Exam Constitutional:      General: She is not in acute distress. Cardiovascular:     Rate and Rhythm: Normal rate and regular rhythm.  Pulmonary:     Effort: Pulmonary effort is normal.  Abdominal:     General: Bowel sounds are normal.     Palpations: Abdomen is soft.     Tenderness: There is abdominal tenderness in the left lower quadrant. There is no right CVA tenderness, left CVA tenderness or guarding.  Musculoskeletal:     Cervical back: Neck supple.  Skin:    General: Skin is warm and dry.           Assessment & Plan:  Acute diverticulitis Assessment & Plan: Symptoms and presentation today consistent.  Given lack of improvement despite conservative treatment, we will proceed with antibiotic treatment.  She agrees.  She is a difficult time tolerating  ciprofloxacin/metronidazole course so we will proceed with Augmentin.  Start Augmentin antibiotics. Take 1 tablet by mouth twice daily for 7 days. We discussed clear liquid diet/very bland diet.  ED precautions provided.  Orders: -     Amoxicillin-Pot Clavulanate; Take 1 tablet by mouth 2 (two) times daily.  Dispense: 14 tablet; Refill: 0  Microscopic hematuria Assessment & Plan: Noted on numerous prior urinalyses both with and without cystitis.  Urinalysis today with 2+ blood, otherwise negative.  Urine microscopic ordered and pending.  No signs of infection. Will likely refer to urology for hematuria workup.  Await results.  Orders: -     POCT Urinalysis Dipstick (Automated) -     Urine Microscopic        [name], NP

## 2022-12-28 NOTE — Patient Instructions (Signed)
Start Augmentin antibiotics. Take 1 tablet by mouth twice daily for 7 days.  Work on a clear liquid or bland diet for now.  It was a pleasure to see you today!

## 2022-12-28 NOTE — Addendum Note (Signed)
Addended by: Alvina Chou on: 12/28/2022 04:08 PM   Modules accepted: Orders

## 2022-12-28 NOTE — Assessment & Plan Note (Addendum)
Noted on numerous prior urinalyses both with and without cystitis.  Urinalysis today with 2+ blood, otherwise negative.  Urine microscopic ordered and pending.  No signs of infection. Will likely refer to urology for hematuria workup.  Await results.

## 2022-12-29 LAB — URINALYSIS, MICROSCOPIC ONLY
Bacteria, UA: NONE SEEN /HPF
Hyaline Cast: NONE SEEN /LPF
RBC / HPF: NONE SEEN /HPF (ref 0–2)
WBC, UA: NONE SEEN /HPF (ref 0–5)

## 2023-01-08 ENCOUNTER — Other Ambulatory Visit: Payer: Self-pay | Admitting: Neurology

## 2023-01-09 ENCOUNTER — Other Ambulatory Visit: Payer: Self-pay

## 2023-01-09 MED ORDER — EMGALITY 120 MG/ML ~~LOC~~ SOAJ
120.0000 mg | SUBCUTANEOUS | 3 refills | Status: DC
  Filled 2023-01-09: qty 1, 30d supply, fill #0
  Filled 2023-02-04: qty 1, 30d supply, fill #1
  Filled 2023-03-06: qty 1, 30d supply, fill #2
  Filled 2023-04-01: qty 1, 30d supply, fill #3

## 2023-01-29 ENCOUNTER — Other Ambulatory Visit: Payer: Self-pay | Admitting: Neurology

## 2023-01-30 ENCOUNTER — Other Ambulatory Visit: Payer: Self-pay

## 2023-01-30 NOTE — Telephone Encounter (Signed)
LMOVM for patien to tcakll the office back.  Per last ov notes: Add topiramate for synergistic effect, titrating to 50mg  at bedtime. Titrate to 100mg  at bedtime in 4-5 weeks if needed. If ineffective, plan to switch to [facility].   Has patient increased to 100 mg or still at 50 mg.

## 2023-01-31 ENCOUNTER — Other Ambulatory Visit: Payer: Self-pay | Admitting: Neurology

## 2023-01-31 ENCOUNTER — Other Ambulatory Visit: Payer: Self-pay

## 2023-02-01 ENCOUNTER — Other Ambulatory Visit: Payer: Self-pay

## 2023-02-01 MED FILL — Topiramate Tab 25 MG: ORAL | 30 days supply | Qty: 60 | Fill #0 | Status: AC

## 2023-03-06 ENCOUNTER — Other Ambulatory Visit: Payer: Self-pay

## 2023-03-06 ENCOUNTER — Other Ambulatory Visit: Payer: Self-pay | Admitting: Neurology

## 2023-03-06 MED FILL — Trazodone HCl Tab 100 MG: ORAL | 90 days supply | Qty: 90 | Fill #1 | Status: AC

## 2023-03-07 ENCOUNTER — Other Ambulatory Visit: Payer: Self-pay

## 2023-03-07 ENCOUNTER — Other Ambulatory Visit: Payer: Self-pay | Admitting: Neurology

## 2023-03-08 ENCOUNTER — Other Ambulatory Visit: Payer: Self-pay

## 2023-03-08 MED FILL — Topiramate Tab 25 MG: ORAL | 30 days supply | Qty: 60 | Fill #0 | Status: AC

## 2023-03-13 ENCOUNTER — Other Ambulatory Visit: Payer: Self-pay

## 2023-03-22 MED FILL — Losartan Potassium Tab 100 MG: ORAL | 90 days supply | Qty: 90 | Fill #1 | Status: AC

## 2023-03-22 MED FILL — Fluoxetine HCl Cap 40 MG: ORAL | 90 days supply | Qty: 90 | Fill #1 | Status: AC

## 2023-04-01 ENCOUNTER — Other Ambulatory Visit: Payer: Self-pay

## 2023-04-02 ENCOUNTER — Other Ambulatory Visit: Payer: Self-pay

## 2023-04-04 ENCOUNTER — Other Ambulatory Visit: Payer: Self-pay

## 2023-04-08 ENCOUNTER — Other Ambulatory Visit: Payer: Self-pay | Admitting: Neurology

## 2023-04-09 ENCOUNTER — Other Ambulatory Visit: Payer: Self-pay | Admitting: Neurology

## 2023-04-09 ENCOUNTER — Other Ambulatory Visit: Payer: Self-pay

## 2023-04-11 ENCOUNTER — Other Ambulatory Visit: Payer: Self-pay

## 2023-04-12 ENCOUNTER — Telehealth: Payer: Self-pay

## 2023-04-12 ENCOUNTER — Other Ambulatory Visit: Payer: Self-pay | Admitting: Neurology

## 2023-04-12 ENCOUNTER — Encounter: Payer: Self-pay | Admitting: Neurology

## 2023-04-12 ENCOUNTER — Other Ambulatory Visit: Payer: Self-pay

## 2023-04-12 MED ORDER — TOPIRAMATE 50 MG PO TABS
50.0000 mg | ORAL_TABLET | Freq: Every day | ORAL | 5 refills | Status: DC
  Filled 2023-04-12: qty 30, 30d supply, fill #0
  Filled 2023-05-02: qty 30, 30d supply, fill #1
  Filled 2023-06-19: qty 30, 30d supply, fill #2
  Filled 2023-07-26: qty 30, 30d supply, fill #3
  Filled 2023-08-21: qty 30, 30d supply, fill #4
  Filled 2023-09-08 – 2023-10-14 (×2): qty 30, 30d supply, fill #5

## 2023-04-12 NOTE — Telephone Encounter (Signed)
PA needed for Botox 

## 2023-04-19 ENCOUNTER — Other Ambulatory Visit (HOSPITAL_COMMUNITY): Payer: Self-pay

## 2023-04-19 ENCOUNTER — Telehealth: Payer: Self-pay | Admitting: Pharmacy Technician

## 2023-04-19 DIAGNOSIS — G43709 Chronic migraine without aura, not intractable, without status migrainosus: Secondary | ICD-10-CM

## 2023-04-19 NOTE — Telephone Encounter (Signed)
PA has been submitted, and telephone encounter has been created. 

## 2023-04-19 NOTE — Telephone Encounter (Signed)
Pharmacy Patient Advocate Encounter  [employer] verification has been submitted. Benefit Verification #:   [id number]  Pharmacy PA has been submitted for BOTOX 100u/200u: 200u via CoverMyMeds. INSURANCE: MEDIMPACT DATE SUBMITTED: 12.27.24 KEY: [id number] Status is pending

## 2023-04-24 HISTORY — PX: OTHER SURGICAL HISTORY: SHX169

## 2023-05-02 ENCOUNTER — Other Ambulatory Visit: Payer: Self-pay | Admitting: Neurology

## 2023-05-02 ENCOUNTER — Other Ambulatory Visit: Payer: Self-pay

## 2023-05-02 MED ORDER — EMGALITY 120 MG/ML ~~LOC~~ SOAJ
120.0000 mg | SUBCUTANEOUS | 3 refills | Status: DC
  Filled 2023-05-02: qty 1, 30d supply, fill #0
  Filled 2023-06-05: qty 1, 30d supply, fill #1
  Filled 2023-07-04: qty 1, 30d supply, fill #2
  Filled 2023-08-20: qty 1, 30d supply, fill #3

## 2023-05-06 ENCOUNTER — Other Ambulatory Visit: Payer: Self-pay

## 2023-05-06 ENCOUNTER — Encounter: Payer: Self-pay | Admitting: Pharmacist

## 2023-05-06 MED ORDER — COMBIPATCH 0.05-0.14 MG/DAY TD PTTW
MEDICATED_PATCH | TRANSDERMAL | 2 refills | Status: AC
  Filled 2023-05-06: qty 8, 28d supply, fill #0
  Filled 2023-06-19: qty 8, 28d supply, fill #1
  Filled 2023-07-26: qty 8, 28d supply, fill #2
  Filled 2023-09-08: qty 8, 28d supply, fill #3
  Filled 2023-11-11: qty 8, 28d supply, fill #4
  Filled 2023-12-09: qty 8, 28d supply, fill #5
  Filled 2024-01-08: qty 8, 28d supply, fill #6

## 2023-05-06 NOTE — Progress Notes (Signed)
 NEUROLOGY FOLLOW UP OFFICE NOTE  Krista Carpenter [id number]  Assessment/Plan:   Chronic migraine without aura, without status migrainosus, not intractable   Migraine prevention:  Start Botox .  Continue Emgality  Q4wks (improved but still frequent), propranolol  ER 120mg  daily., topiramate  50mg  at bedtime.  Migraine rescue:  Ubrelvy  100mg  Limit use of pain relievers to no more than 2 days out of week to prevent risk of rebound or medication-overuse headache. Keep headache diary Follow up 6 months       Subjective:  Krista Carpenter is a 58 year old right-handed female with IBS who follows up for migraine.  UPDATE: Restarted topiramate  in addition to Emgality .  In addition, she has been approved for Botox .   Has not been using Ubrelvy  since November because she didn't know it was approved.     Migraines are 2 a week, lasting 3 hours with Ubrelvy , all day with ibuprofen .  However, she still has a mild headache daily.  Holidays were stressful.  Her father unexpectedly passed away, so there has been slight increase in migraine frequency.    Notes some left sided neck pain/tightness  Current NSAIDS/analgesics:  ibuprofen   Current triptans:  none Current ergotamine:  none Current anti-emetic:  none Current muscle relaxants:  none Current Antihypertensive medications:  propranolol  ER 120mg  daily, losartan  Current Antidepressant medications:  fluoxetine , trazodone  (insomnia) Current Anticonvulsant medications:  topiramate  50mg  at bedtime Current anti-CGRP:  Emgality , Ubrelvy  100mg  Current Vitamins/Herbal/Supplements:  none Current Antihistamines/Decongestants:  none Other therapy:  none     Caffeine :  1 cup coffee daily (sometimes 2). Diet:  water.  Rarely soda.  Does not skip meals Exercise:  not routine Depression:  stable; Anxiety:  stable Other pain:  no Sleep hygiene:  okay with trazodone   HISTORY:  Onset:  since she was young.  Became worse after having her second  child in 1999  Location:  varies.  Mostly across forehead and temples bilateral; occipital/base of skull and down neck bilateral Quality:  pressure, throbbing, pounding Intensity:  moderate to severe.   Aura:  absent Prodrome:  absent Associated symptoms:  Floaters and blurred vision (all the time), photophobia, phonophobia, infrequent nausea.  She denies associated unilateral numbness or weakness. Duration:  several hours, sometimes 1-2 days, rarely 5 days Frequency:  27-28 days a month (up to 5 days a month is severe).  Often occurs after lunch or wakes up with it Frequency of abortive medication: Tylenol  or Advil  daily (for years) Triggers:  change in weather, MSG, looking at the computer screen Relieving factors:  laying down, ice pack Activity:  movement aggravates.  Up to 5 days a month difficult to function   Prior workup for headaches included MRI of brain from 12/04/2000 which was negative.     Past NSAIDS/analgesics:  ibuprofen , acetaminophen , celecoxib , meloxicam , ketorolac  IM Past abortive triptans:  sumatriptan  (did not tolerate), rizatriptan  (did not tolerate), eletriptan  Past abortive ergotamine:  none Past muscle relaxants:  orphenadrine  Past anti-emetic:  ondansetron , promethazine  Past antihypertensive medications:  none Past antidepressant medications:  amitriptyline (side effects), sertraline  Past anticonvulsant medications:  topiramate  50mg  TID Past anti-CGRP:  Nurtec Past vitamins/Herbal/Supplements:  none Past antihistamines/decongestants:  none Other past therapies:  trigger point injections    Family history of headache:  mom (migraines), maternal aunt (migraines)  PAST MEDICAL HISTORY: Past Medical History:  Diagnosis Date   Acute diverticulitis    Acute sinusitis 02/05/2020   AKI (acute kidney injury) (HCC) 03/02/2020   Community acquired pneumonia of right  lower lobe of lung 03/11/2020   Diverticula of colon    sigmoid and descending colon    Diverticulitis 11/04/2018   History of abnormal cervical Pap smear    IBS (irritable bowel syndrome)    Kidney infection 02/2020   Migraine    Pyelonephritis of left kidney 03/02/2020   Sepsis (HCC) 03/02/2020   Transaminitis 03/02/2020    MEDICATIONS: Current Outpatient Medications on File Prior to Visit  Medication Sig Dispense Refill   amoxicillin -clavulanate (AUGMENTIN ) 875-125 MG tablet Take 1 tablet by mouth 2 (two) times daily. 14 tablet 0   conjugated estrogens  (PREMARIN ) vaginal cream Place 0.5 Applicatorfuls vaginally 2 (two) times a week. 30 g 2   FLUoxetine  (PROZAC ) 40 MG capsule Take 1 capsule (40 mg total) by mouth daily for anxiety and depression 90 capsule 2   Galcanezumab -gnlm (EMGALITY ) 120 MG/ML SOAJ Inject 120 mg into the skin every 30 (thirty) days. 1 mL 3   losartan  (COZAAR ) 100 MG tablet Take 1 tablet (100 mg total) by mouth daily. for blood pressure 90 tablet 2   PREMARIN  vaginal cream Insert 0.5 applicatorful every day by vaginal route for two weeks then to twice a week     propranolol  ER (INDERAL  LA) 120 MG 24 hr capsule Take 1 capsule (120 mg total) by mouth daily for headache prevention. 90 capsule 2   topiramate  (TOPAMAX ) 50 MG tablet Take 1 tablet (50 mg total) by mouth at bedtime. 30 tablet 5   traZODone  (DESYREL ) 100 MG tablet Take 1 tablet (100 mg total) by mouth at bedtime. For sleep 90 tablet 2   Ubrogepant  (UBRELVY ) 100 MG TABS Take 1 tablet (100 mg total) by mouth as needed. May repeat after 2 hours.  Maximum 2 tablets in 24 hours. 16 tablet 11   No current facility-administered medications on file prior to visit.    ALLERGIES: Allergies  Allergen Reactions   Hydrocodone  Itching   Oxycodone  Itching    FAMILY HISTORY: Family History  Problem Relation Age of Onset   Cancer Mother        skin   Irritable bowel syndrome Mother    Diverticulosis Mother    Diabetes Father    Diabetes Brother    Diabetes Maternal Uncle       Objective:   Blood pressure 132/69, pulse 67, height 5' (1.524 m), weight 153 lb 6.4 oz (69.6 kg), last menstrual period 12/02/2012, SpO2 97%. General: No acute distress.  Patient appears well-groomed.   Head:  Normocephalic/atraumatic Neck:  Supple.  No paraspinal tenderness.  Full range of motion. Heart:  Regular rate and rhythm. Neuro:  Alert and oriented.  Speech fluent and not dysarthric.  Language intact.  CN II-XII intact.  Bulk and tone normal.  Muscle strength 5/5 throughout.  Deep tendon reflexes 2+ throughout.  Gait normal.  Romberg negative.   Juliene Dunnings, DO  CC: Comer Gaskins, NP

## 2023-05-07 ENCOUNTER — Other Ambulatory Visit: Payer: Self-pay

## 2023-05-07 ENCOUNTER — Other Ambulatory Visit (HOSPITAL_COMMUNITY): Payer: Self-pay

## 2023-05-07 ENCOUNTER — Encounter: Payer: Self-pay | Admitting: Neurology

## 2023-05-07 ENCOUNTER — Other Ambulatory Visit (HOSPITAL_COMMUNITY): Payer: Self-pay | Admitting: Pharmacy Technician

## 2023-05-07 ENCOUNTER — Ambulatory Visit (INDEPENDENT_AMBULATORY_CARE_PROVIDER_SITE_OTHER): Payer: 59 | Admitting: Neurology

## 2023-05-07 ENCOUNTER — Telehealth: Payer: Self-pay | Admitting: Pharmacy Technician

## 2023-05-07 VITALS — BP 132/69 | HR 67 | Ht 60.0 in | Wt 153.4 lb

## 2023-05-07 DIAGNOSIS — G43709 Chronic migraine without aura, not intractable, without status migrainosus: Secondary | ICD-10-CM

## 2023-05-07 MED ORDER — ONABOTULINUMTOXINA 200 UNITS IJ SOLR
INTRAMUSCULAR | 4 refills | Status: AC
  Filled 2023-05-07: qty 1, fill #0
  Filled 2023-06-11: qty 1, 90d supply, fill #0
  Filled 2023-08-22 (×2): qty 1, 90d supply, fill #1
  Filled 2023-12-19: qty 1, 90d supply, fill #2
  Filled 2023-12-24: qty 1, 30d supply, fill #2
  Filled 2023-12-25 – 2023-12-27 (×2): qty 1, 90d supply, fill #2
  Filled 2024-03-25: qty 1, 90d supply, fill #3

## 2023-05-07 MED ORDER — UBRELVY 100 MG PO TABS
1.0000 | ORAL_TABLET | ORAL | 11 refills | Status: AC | PRN
  Filled 2023-05-07 – 2023-05-13 (×2): qty 16, 30d supply, fill #0
  Filled 2023-06-05 – 2023-06-19 (×2): qty 16, 30d supply, fill #1
  Filled 2023-09-20: qty 16, 30d supply, fill #2
  Filled 2023-11-11: qty 16, 30d supply, fill #3
  Filled 2024-02-07: qty 16, 30d supply, fill #4
  Filled 2024-04-01: qty 16, 30d supply, fill #5
  Filled 2024-04-29: qty 16, 30d supply, fill #6

## 2023-05-07 NOTE — Patient Instructions (Signed)
 Start Botox.  Continue Emgality and topiramate Ubrelvy as needed

## 2023-05-07 NOTE — Telephone Encounter (Signed)
 Pharmacy Patient Advocate Encounter- Botox  BIV-Pharmacy Benefit:  PA was submitted to [facility] and has been approved through: 12.27.24 - 6.25.25 Authorization# [id number]  Please send prescription to Specialty Pharmacy: [facility]: [phone]  Estimated Copay is: $1268.00   Patient IS eligible for Botox  Copay Card, which will make patient's copay as little as zero. Copay card will be provided to pharmacy.

## 2023-05-07 NOTE — Telephone Encounter (Signed)
 Please discard message, patient supposed to have PA for Ubrelvy 100 mg.

## 2023-05-07 NOTE — Telephone Encounter (Signed)
 Script sent to [facility].  Patient advised Via Mychart and Message left on VM

## 2023-05-07 NOTE — Telephone Encounter (Signed)
 Pharmacy Patient Advocate Encounter   Received notification from Physician's Office that prior authorization for UBRELVY  100MG  is required/requested.   Insurance verification completed.   The patient is insured through [facility] .   Per test claim: PA required; PA submitted to above mentioned insurance via CoverMyMeds Key/confirmation #/EOC [id number] Status is pending

## 2023-05-07 NOTE — Progress Notes (Signed)
 Medication Samples have been provided to the patient.  Drug name: ubrelvy         Strength: 100 mg        Qty: 4  LOT: [id number]  Exp.Date: 1/26  Dosing instructions: as needed  The patient has been instructed regarding the correct time, dose, and frequency of taking this medication, including desired effects and most common side effects.   Krista Carpenter 10:25 AM 05/07/2023

## 2023-05-10 ENCOUNTER — Other Ambulatory Visit (HOSPITAL_COMMUNITY): Payer: Self-pay

## 2023-05-10 ENCOUNTER — Ambulatory Visit: Payer: 59 | Admitting: Primary Care

## 2023-05-10 NOTE — Telephone Encounter (Signed)
Pharmacy Patient Advocate Encounter  Received notification from [facility] that Prior Authorization for Ubrelvy 100MG  tablets has been APPROVED from 05/07/2023 to 05/05/2024   PA #/Case ID/Reference #: [id number]

## 2023-05-10 NOTE — Telephone Encounter (Signed)
PA request has been Approved. New Encounter created for follow up. For additional info see Pharmacy Prior Auth telephone encounter from 05/07/2023.

## 2023-05-13 MED FILL — Trazodone HCl Tab 100 MG: ORAL | 90 days supply | Qty: 90 | Fill #2 | Status: AC

## 2023-05-14 ENCOUNTER — Other Ambulatory Visit: Payer: Self-pay

## 2023-05-16 ENCOUNTER — Ambulatory Visit: Payer: 59 | Admitting: Primary Care

## 2023-05-16 ENCOUNTER — Other Ambulatory Visit: Payer: Self-pay

## 2023-05-16 ENCOUNTER — Encounter: Payer: Self-pay | Admitting: Primary Care

## 2023-05-16 VITALS — BP 106/60 | HR 62 | Temp 97.3°F | Ht 60.0 in | Wt 151.0 lb

## 2023-05-16 DIAGNOSIS — F4323 Adjustment disorder with mixed anxiety and depressed mood: Secondary | ICD-10-CM

## 2023-05-16 MED ORDER — BUPROPION HCL ER (XL) 150 MG PO TB24
150.0000 mg | ORAL_TABLET | Freq: Every day | ORAL | 0 refills | Status: DC
  Filled 2023-05-16: qty 90, 90d supply, fill #0

## 2023-05-16 MED ORDER — FLUOXETINE HCL 20 MG PO TABS
20.0000 mg | ORAL_TABLET | Freq: Every day | ORAL | 0 refills | Status: DC
  Filled 2023-05-16: qty 30, 30d supply, fill #0

## 2023-05-16 NOTE — Assessment & Plan Note (Signed)
Deteriorated.  Discussed options for treatment including therapy and medication adjustment.   Continue Trazodone 100 mg HS. Wean off fluoxetine but reducing to 20 mg x several weeks, then reduce to 10 mg x 2 weeks, then stop.  Start bupropion XL 150 mg daily in AM.   Close follow up in 1 month.

## 2023-05-16 NOTE — Progress Notes (Signed)
Subjective:    Patient ID: Krista Carpenter, female    DOB: 03-Sep-1965, 58 y.o.   MRN: [id number]  Depression        Associated symptoms include no headaches.   Krista Carpenter is a very pleasant 58 y.o. female with a history of anxiety and depression, insomnia who presents today to discuss depression.  Symptoms began about one year ago gradually. Worse when her daughter left the house and get married and more recently worse since she lost her father unexpectedly just prior to Christmas 2024. Symptoms include little motivation to do things, tearfulness, feeling down, not wanting to be around others, not enjoying things she once enjoyed. Over the last 1 month she's really felt that she doesn't want to leave her home and go to work.   She feels that her anxiety is overall well managed. She is sleeping well, but feels "exhausted" during the day.   Currently managed on fluoxetine 40 mg daily and trazodone 100 mg at bedtime. Historically, fluoxetine has helped with symptoms of anxiety and depression until over the last 1 year.  Previously managed on Zoloft which eventually became ineffective.  She has tried Lexapro in the past, does not recall why it was discontinued.  BP Readings from Last 3 Encounters:  05/16/23 106/60  05/07/23 132/69  12/28/22 126/78      Review of Systems  Respiratory:  Negative for shortness of breath.   Cardiovascular:  Negative for chest pain.  Neurological:  Negative for headaches.  Psychiatric/Behavioral:  Positive for depression. Negative for sleep disturbance. The patient is not nervous/anxious.        See HPI         Past Medical History:  Diagnosis Date   Acute diverticulitis    Acute sinusitis 02/05/2020   AKI (acute kidney injury) (HCC) 03/02/2020   Community acquired pneumonia of right lower lobe of lung 03/11/2020   Diverticula of colon    sigmoid and descending colon   Diverticulitis 11/04/2018   History of abnormal cervical Pap smear     IBS (irritable bowel syndrome)    Kidney infection 02/2020   Migraine    Pyelonephritis of left kidney 03/02/2020   Sepsis (HCC) 03/02/2020   Transaminitis 03/02/2020    Social History   Socioeconomic History   Marital status: Married    Spouse name: Not on file   Number of children: Not on file   Years of education: Not on file   Highest education level: Not on file  Occupational History   Not on file  Tobacco Use   Smoking status: Never   Smokeless tobacco: Never  Vaping Use   Vaping status: Never Used  Substance and Sexual Activity   Alcohol use: Yes    Alcohol/week: 0.0 standard drinks of alcohol    Comment: occasion   Drug use: No   Sexual activity: Yes    Partners: Male    Birth control/protection: I.U.D.  Other Topics Concern   Not on file  Social History Narrative   Married.   2 children.   Works at [employer].   Enjoys scrapbooking, reading.   Right handed   Social Drivers of Corporate investment banker Strain: Not on file  Food Insecurity: Not on file  Transportation Needs: Not on file  Physical Activity: Not on file  Stress: Not on file  Social Connections: Not on file  Intimate Partner Violence: Not on file    Past Surgical History:  Procedure Laterality  Date   CHOLECYSTECTOMY     colonoscopy     DILATION AND CURETTAGE OF UTERUS     TONSILLECTOMY     WRIST ARTHROCENTESIS      Family History  Problem Relation Age of Onset   Cancer Mother        skin   Irritable bowel syndrome Mother    Diverticulosis Mother    Diabetes Father    Diabetes Brother    Diabetes Maternal Uncle     Allergies  Allergen Reactions   Hydrocodone Itching   Oxycodone Itching    Current Outpatient Medications on File Prior to Visit  Medication Sig Dispense Refill   conjugated estrogens (PREMARIN) vaginal cream Place 0.5 Applicatorfuls vaginally 2 (two) times a week. 30 g 2   estradiol-norethindrone (COMBIPATCH) 0.05-0.14 MG/DAY Place 1 patch onto the  skin 2 (two) times a week. 24 patch 2   Galcanezumab-gnlm (EMGALITY) 120 MG/ML SOAJ Inject 120 mg into the skin every 30 (thirty) days. 1 mL 3   losartan (COZAAR) 100 MG tablet Take 1 tablet (100 mg total) by mouth daily. for blood pressure 90 tablet 2   propranolol ER (INDERAL LA) 120 MG 24 hr capsule Take 1 capsule (120 mg total) by mouth daily for headache prevention. 90 capsule 2   topiramate (TOPAMAX) 50 MG tablet Take 1 tablet (50 mg total) by mouth at bedtime. 30 tablet 5   traZODone (DESYREL) 100 MG tablet Take 1 tablet (100 mg total) by mouth at bedtime. For sleep 90 tablet 2   Ubrogepant (UBRELVY) 100 MG TABS Take 1 tablet (100 mg total) by mouth as needed. May repeat after 2 hours.  Maximum 2 tablets in 24 hours. 16 tablet 11   botulinum toxin Type A (BOTOX) 200 units injection Inject 155 units IM into multiple site in the face,neck and head once every 90 days (Patient not taking: Reported on 05/16/2023) 1 each 4   No current facility-administered medications on file prior to visit.    BP 106/60   Pulse 62   Temp (!) 97.3 F (36.3 C) (Temporal)   Ht 5' (1.524 m)   Wt 151 lb (68.5 kg)   LMP 12/02/2012   SpO2 99%   BMI 29.49 kg/m  Objective:   Physical Exam Cardiovascular:     Rate and Rhythm: Normal rate and regular rhythm.  Pulmonary:     Effort: Pulmonary effort is normal.     Breath sounds: Normal breath sounds.  Musculoskeletal:     Cervical back: Neck supple.  Skin:    General: Skin is warm and dry.  Neurological:     Mental Status: She is alert and oriented to person, place, and time.  Psychiatric:        Mood and Affect: Mood normal.           Assessment & Plan:  Adjustment disorder with mixed anxiety and depressed mood Assessment & Plan: Deteriorated.  Discussed options for treatment including therapy and medication adjustment.   Continue Trazodone 100 mg HS. Wean off fluoxetine but reducing to 20 mg x several weeks, then reduce to 10 mg x 2 weeks,  then stop.  Start bupropion XL 150 mg daily in AM.   Close follow up in 1 month.   Orders: -     Ambulatory referral to Psychology -     buPROPion HCl ER (XL); Take 1 tablet (150 mg total) by mouth daily. For depression  Dispense: 90 tablet; Refill: 0 -  FLUoxetine HCl; Take 1 tablet (20 mg total) by mouth daily. for anxiety and depression.  Dispense: 30 tablet; Refill: 0        [name], NP

## 2023-05-16 NOTE — Patient Instructions (Signed)
Wean off of fluoxetine. Take 1 tablet mg daily x 2 weeks, the reduce to 1/2 tablet daily x 2 weeks, then stop.  Start bupropion XL 150 mg once daily in the morning for depression.  You will either be contacted via phone regarding your referral to therapy, or you may receive a letter on your MyChart portal from our referral team with instructions for scheduling an appointment. Please let us know if you have not been contacted by anyone within two weeks.  Please schedule a follow-up visit for 1 month.  It was a pleasure to see you today!

## 2023-05-23 ENCOUNTER — Other Ambulatory Visit: Payer: Self-pay

## 2023-05-23 MED ORDER — AMOXICILLIN 500 MG PO CAPS
500.0000 mg | ORAL_CAPSULE | Freq: Three times a day (TID) | ORAL | 0 refills | Status: DC
  Filled 2023-05-23: qty 21, 7d supply, fill #0

## 2023-05-30 ENCOUNTER — Encounter: Payer: Self-pay | Admitting: Neurology

## 2023-05-31 ENCOUNTER — Other Ambulatory Visit: Payer: Self-pay

## 2023-05-31 ENCOUNTER — Telehealth: Payer: Self-pay

## 2023-05-31 MED ORDER — PREDNISONE 10 MG PO TABS
ORAL_TABLET | ORAL | 0 refills | Status: AC
  Filled 2023-05-31: qty 21, 6d supply, fill #0

## 2023-05-31 NOTE — Telephone Encounter (Signed)
 Per DR.Jaffe okay to send in Prednisone  taper.

## 2023-06-05 ENCOUNTER — Other Ambulatory Visit: Payer: Self-pay

## 2023-06-06 ENCOUNTER — Other Ambulatory Visit: Payer: Self-pay

## 2023-06-10 ENCOUNTER — Other Ambulatory Visit (HOSPITAL_COMMUNITY): Payer: Self-pay

## 2023-06-10 ENCOUNTER — Other Ambulatory Visit: Payer: Self-pay

## 2023-06-11 ENCOUNTER — Other Ambulatory Visit (HOSPITAL_COMMUNITY): Payer: Self-pay

## 2023-06-11 ENCOUNTER — Other Ambulatory Visit: Payer: Self-pay | Admitting: Pharmacy Technician

## 2023-06-11 ENCOUNTER — Other Ambulatory Visit: Payer: Self-pay

## 2023-06-11 NOTE — Progress Notes (Signed)
 Specialty Pharmacy Initial Fill Coordination Note  Krista Carpenter is a 58 y.o. female contacted today regarding initial fill of specialty medication(s) OnabotulinumtoxinA (BOTOX)   Patient requested Courier to Provider Office   Delivery date: 06/18/23   Verified address: [facility]  [address]  [place], [place], [address]   Medication will be filled on 2.24.25.   Patient is aware of $0 copayment.

## 2023-06-13 ENCOUNTER — Other Ambulatory Visit (HOSPITAL_COMMUNITY): Payer: Self-pay

## 2023-06-14 ENCOUNTER — Ambulatory Visit: Payer: 59 | Admitting: Neurology

## 2023-06-19 ENCOUNTER — Other Ambulatory Visit: Payer: Self-pay

## 2023-06-19 ENCOUNTER — Ambulatory Visit (INDEPENDENT_AMBULATORY_CARE_PROVIDER_SITE_OTHER): Payer: 59 | Admitting: Neurology

## 2023-06-19 DIAGNOSIS — G43709 Chronic migraine without aura, not intractable, without status migrainosus: Secondary | ICD-10-CM | POA: Diagnosis not present

## 2023-06-19 MED ORDER — ONABOTULINUMTOXINA 100 UNITS IJ SOLR
200.0000 [IU] | Freq: Once | INTRAMUSCULAR | Status: AC
Start: 2023-06-19 — End: 2023-06-19
  Administered 2023-06-19: 155 [IU] via INTRAMUSCULAR

## 2023-06-19 MED FILL — Losartan Potassium Tab 100 MG: ORAL | 90 days supply | Qty: 90 | Fill #2 | Status: AC

## 2023-06-19 NOTE — Progress Notes (Signed)

## 2023-06-20 ENCOUNTER — Other Ambulatory Visit: Payer: Self-pay

## 2023-06-21 ENCOUNTER — Other Ambulatory Visit: Payer: Self-pay

## 2023-06-21 ENCOUNTER — Encounter: Payer: Self-pay | Admitting: *Deleted

## 2023-06-21 ENCOUNTER — Encounter: Payer: Self-pay | Admitting: Primary Care

## 2023-06-21 ENCOUNTER — Ambulatory Visit: Payer: 59 | Admitting: Primary Care

## 2023-06-21 VITALS — BP 136/68 | HR 73 | Temp 97.8°F | Ht 60.0 in | Wt 153.0 lb

## 2023-06-21 DIAGNOSIS — F4323 Adjustment disorder with mixed anxiety and depressed mood: Secondary | ICD-10-CM | POA: Diagnosis not present

## 2023-06-21 DIAGNOSIS — G43719 Chronic migraine without aura, intractable, without status migrainosus: Secondary | ICD-10-CM

## 2023-06-21 MED ORDER — BUPROPION HCL ER (XL) 300 MG PO TB24
300.0000 mg | ORAL_TABLET | Freq: Every day | ORAL | 0 refills | Status: DC
  Filled 2023-06-21: qty 90, 90d supply, fill #0

## 2023-06-21 MED ORDER — KETOROLAC TROMETHAMINE 60 MG/2ML IM SOLN
60.0000 mg | Freq: Once | INTRAMUSCULAR | Status: AC
  Administered 2023-06-21: 60 mg via INTRAMUSCULAR

## 2023-06-21 NOTE — Assessment & Plan Note (Signed)
 Slight improvement, still not at goal.  New referral placed for therapy as she never heard back. Increase Wellbutrin XL to 300 mg daily.  She will update via MyChart in 1 month.

## 2023-06-21 NOTE — Assessment & Plan Note (Signed)
 Chronic and continued.  Reviewed neurology notes from 2 days ago. Continue with Botox injections.   Continue propranolol ER 120 mg daily, Topamax 50 mg HS, Emgality 120 mg once monthly, Ubrelvy 100 mg daily.   IM Toradol 60 mg provided today.  She can return to our office for PRN Toradol with my permission.   Will update MRI brain today. She will schedule with her eye doctor.

## 2023-06-21 NOTE — Progress Notes (Signed)
 Subjective:    Patient ID: Krista Carpenter, female    DOB: Oct 31, 1965, 58 y.o.   MRN: [id number]  HPI  Krista Carpenter is a very pleasant 58 y.o. female with a history of hypertension, migraines, anxiety and depression, prediabetes, hyperlipidemia who presents today for follow-up of anxiety depression.  She was last evaluated on 05/16/2023 for worsening depression despite management on fluoxetine 40 mg daily and trazodone 100 mg at bedtime.  During this visit her trazodone 100 mg at bedtime was continued, fluoxetine was weaned off, bupropion XL 150 mg was initiated.  She is here for follow-up today.  Since her last visit she's feeling better. Improved symptoms include less tearfulness, feeling less down. She continues to notice little motivation to do anything on the weekends, not looking forward to the beach every weekend but believes she's bored with the beach, hard to get to work.  She never received a phone call for scheduling.  She did complete paperwork.  She continues to experience a daily headache since January 2025. Migraines are overall more frequent, lasting longer, and are more intense. Migraines typically begin and are located to the left side either behind her eye, frontal lobe, parietal lobe and left neck. She denies neck pain outside of headaches. She does experience phonophobia and photophobia. She's noticed blurred vision, has not seen her eye doctor recently.   She suspects a lot of her symptoms are secondary to her chronic headaches. Follows with neurology, received a botox injection two days ago. She's not had a MRI of her brain since she was 27. She's not undergone an xray of her neck.   BP Readings from Last 3 Encounters:  06/21/23 136/68  05/16/23 106/60  05/07/23 132/69     Review of Systems  Eyes:  Positive for photophobia.  Respiratory:  Negative for shortness of breath.   Cardiovascular:  Negative for chest pain.  Musculoskeletal:  Positive for neck pain.   Neurological:  Positive for headaches. Negative for numbness.  Psychiatric/Behavioral:         See HPI         Past Medical History:  Diagnosis Date   Acute diverticulitis    Acute sinusitis 02/05/2020   AKI (acute kidney injury) (HCC) 03/02/2020   Community acquired pneumonia of right lower lobe of lung 03/11/2020   Diverticula of colon    sigmoid and descending colon   Diverticulitis 11/04/2018   History of abnormal cervical Pap smear    IBS (irritable bowel syndrome)    Kidney infection 02/2020   Migraine    Pyelonephritis of left kidney 03/02/2020   Sepsis (HCC) 03/02/2020   Transaminitis 03/02/2020    Social History   Socioeconomic History   Marital status: Married    Spouse name: Not on file   Number of children: Not on file   Years of education: Not on file   Highest education level: Not on file  Occupational History   Not on file  Tobacco Use   Smoking status: Never   Smokeless tobacco: Never  Vaping Use   Vaping status: Never Used  Substance and Sexual Activity   Alcohol use: Yes    Alcohol/week: 0.0 standard drinks of alcohol    Comment: occasion   Drug use: No   Sexual activity: Yes    Partners: Male    Birth control/protection: I.U.D.  Other Topics Concern   Not on file  Social History Narrative   Married.   2 children.   Works  at [employer].   Enjoys scrapbooking, reading.   Right handed   Social Drivers of Corporate investment banker Strain: Not on file  Food Insecurity: Not on file  Transportation Needs: Not on file  Physical Activity: Not on file  Stress: Not on file  Social Connections: Not on file  Intimate Partner Violence: Not on file    Past Surgical History:  Procedure Laterality Date   CHOLECYSTECTOMY     colonoscopy     DILATION AND CURETTAGE OF UTERUS     Receeding Gum repair  04/2023   TONSILLECTOMY     WRIST ARTHROCENTESIS      Family History  Problem Relation Age of Onset   Cancer Mother        skin    Irritable bowel syndrome Mother    Diverticulosis Mother    Diabetes Father    Diabetes Brother    Diabetes Maternal Uncle     Allergies  Allergen Reactions   Hydrocodone Itching   Oxycodone Itching    Current Outpatient Medications on File Prior to Visit  Medication Sig Dispense Refill   botulinum toxin Type A (BOTOX) 200 units injection Inject 155 units IM into multiple site in the face,neck and head once every 90 days 1 each 4   conjugated estrogens (PREMARIN) vaginal cream Place 0.5 Applicatorfuls vaginally 2 (two) times a week. 30 g 2   estradiol-norethindrone (COMBIPATCH) 0.05-0.14 MG/DAY Place 1 patch onto the skin 2 (two) times a week. 24 patch 2   FLUoxetine (PROZAC) 20 MG tablet Take 1 tablet (20 mg total) by mouth daily. for anxiety and depression. 30 tablet 0   Galcanezumab-gnlm (EMGALITY) 120 MG/ML SOAJ Inject 120 mg into the skin every 30 (thirty) days. 1 mL 3   losartan (COZAAR) 100 MG tablet Take 1 tablet (100 mg total) by mouth daily. for blood pressure 90 tablet 2   propranolol ER (INDERAL LA) 120 MG 24 hr capsule Take 1 capsule (120 mg total) by mouth daily for headache prevention. 90 capsule 2   topiramate (TOPAMAX) 50 MG tablet Take 1 tablet (50 mg total) by mouth at bedtime. 30 tablet 5   traZODone (DESYREL) 100 MG tablet Take 1 tablet (100 mg total) by mouth at bedtime. For sleep 90 tablet 2   Ubrogepant (UBRELVY) 100 MG TABS Take 1 tablet (100 mg total) by mouth as needed. May repeat after 2 hours.  Maximum 2 tablets in 24 hours. 16 tablet 11   No current facility-administered medications on file prior to visit.    BP 136/68   Pulse 73   Temp 97.8 F (36.6 C) (Temporal)   Ht 5' (1.524 m)   Wt 153 lb (69.4 kg)   LMP 12/02/2012   SpO2 98%   BMI 29.88 kg/m  Objective:   Physical Exam Cardiovascular:     Rate and Rhythm: Normal rate and regular rhythm.  Pulmonary:     Effort: Pulmonary effort is normal.     Breath sounds: Normal breath sounds.   Musculoskeletal:     Cervical back: Neck supple.  Skin:    General: Skin is warm and dry.  Neurological:     Mental Status: She is alert and oriented to person, place, and time.  Psychiatric:        Mood and Affect: Mood normal.           Assessment & Plan:  Intractable chronic migraine without aura and without status migrainosus Assessment & Plan: Chronic and  continued.  Reviewed neurology notes from 2 days ago. Continue with Botox injections.   Continue propranolol ER 120 mg daily, Topamax 50 mg HS, Emgality 120 mg once monthly, Ubrelvy 100 mg daily.   IM Toradol 60 mg provided today.  She can return to our office for PRN Toradol with my permission.   Will update MRI brain today. She will schedule with her eye doctor.   Orders: -     MR BRAIN WO CONTRAST; Future  Adjustment disorder with mixed anxiety and depressed mood Assessment & Plan: Slight improvement, still not at goal.  New referral placed for therapy as she never heard back. Increase Wellbutrin XL to 300 mg daily.  She will update via MyChart in 1 month.  Orders: -     buPROPion HCl ER (XL); Take 1 tablet (300 mg total) by mouth daily. For depression  Dispense: 90 tablet; Refill: 0 -     Ambulatory referral to Psychology        [name], NP

## 2023-06-21 NOTE — Patient Instructions (Signed)
 We increased your Wellbutrin to 300 mg once daily. I sent a new prescription to your pharmacy.  You will receive a phone call regarding your MRI.

## 2023-06-21 NOTE — Addendum Note (Signed)
 Addended by: Lonia Blood on: 06/21/2023 12:03 PM   Modules accepted: Orders

## 2023-07-04 ENCOUNTER — Other Ambulatory Visit: Payer: Self-pay

## 2023-07-11 NOTE — Telephone Encounter (Signed)
 error

## 2023-07-16 ENCOUNTER — Other Ambulatory Visit (HOSPITAL_COMMUNITY): Payer: Self-pay

## 2023-07-19 ENCOUNTER — Ambulatory Visit
Admission: RE | Admit: 2023-07-19 | Discharge: 2023-07-19 | Disposition: A | Discharge status: Home / Self Care | Source: Ambulatory Visit | Attending: Primary Care | Admitting: Primary Care

## 2023-07-19 DIAGNOSIS — R519 Headache, unspecified: Secondary | ICD-10-CM | POA: Diagnosis not present

## 2023-07-19 DIAGNOSIS — G43719 Chronic migraine without aura, intractable, without status migrainosus: Secondary | ICD-10-CM | POA: Diagnosis not present

## 2023-07-22 ENCOUNTER — Ambulatory Visit: Payer: 59 | Admitting: Clinical

## 2023-07-27 ENCOUNTER — Other Ambulatory Visit: Payer: Self-pay

## 2023-07-27 ENCOUNTER — Other Ambulatory Visit (HOSPITAL_BASED_OUTPATIENT_CLINIC_OR_DEPARTMENT_OTHER): Payer: Self-pay

## 2023-07-28 ENCOUNTER — Other Ambulatory Visit: Payer: Self-pay

## 2023-08-20 ENCOUNTER — Other Ambulatory Visit: Payer: Self-pay | Admitting: Primary Care

## 2023-08-20 ENCOUNTER — Other Ambulatory Visit: Payer: Self-pay

## 2023-08-20 DIAGNOSIS — G4700 Insomnia, unspecified: Secondary | ICD-10-CM

## 2023-08-20 MED ORDER — TRAZODONE HCL 100 MG PO TABS
100.0000 mg | ORAL_TABLET | Freq: Every evening | ORAL | 0 refills | Status: DC
  Filled 2023-08-20: qty 90, 90d supply, fill #0

## 2023-08-21 ENCOUNTER — Other Ambulatory Visit (HOSPITAL_COMMUNITY): Payer: Self-pay

## 2023-08-21 ENCOUNTER — Encounter (HOSPITAL_COMMUNITY): Payer: Self-pay

## 2023-08-22 ENCOUNTER — Other Ambulatory Visit: Payer: Self-pay

## 2023-08-22 ENCOUNTER — Other Ambulatory Visit (HOSPITAL_COMMUNITY): Payer: Self-pay

## 2023-08-22 NOTE — Progress Notes (Signed)
 Specialty Pharmacy Refill Coordination Note  Krista Carpenter is a 58 y.o. female contacted today regarding refills of specialty medication(s) Botox .  Patient requested (Patient-Rptd) Delivery   Delivery date: (Patient-Rptd) 10/03/23   Verified address: (Patient-Rptd) Dr. Janne Members office   Medication will be filled on 09/25/23. New delivery date is 09/26/23.  [facility]  [address] [place],  [place] [address]

## 2023-08-26 ENCOUNTER — Other Ambulatory Visit: Payer: Self-pay

## 2023-09-08 ENCOUNTER — Other Ambulatory Visit: Payer: Self-pay | Admitting: Primary Care

## 2023-09-08 ENCOUNTER — Other Ambulatory Visit: Payer: Self-pay

## 2023-09-08 DIAGNOSIS — R519 Headache, unspecified: Secondary | ICD-10-CM

## 2023-09-09 ENCOUNTER — Other Ambulatory Visit: Payer: Self-pay

## 2023-09-09 ENCOUNTER — Other Ambulatory Visit: Payer: Self-pay | Admitting: Primary Care

## 2023-09-09 DIAGNOSIS — R519 Headache, unspecified: Secondary | ICD-10-CM

## 2023-09-09 MED FILL — Propranolol HCl Cap ER 24HR 120 MG: ORAL | 90 days supply | Qty: 90 | Fill #0 | Status: AC

## 2023-09-10 ENCOUNTER — Other Ambulatory Visit: Payer: Self-pay

## 2023-09-17 ENCOUNTER — Ambulatory Visit (INDEPENDENT_AMBULATORY_CARE_PROVIDER_SITE_OTHER): Payer: 59 | Admitting: Primary Care

## 2023-09-17 ENCOUNTER — Encounter: Payer: Self-pay | Admitting: Primary Care

## 2023-09-17 VITALS — BP 120/86 | HR 90 | Temp 97.2°F | Ht 60.0 in | Wt 152.0 lb

## 2023-09-17 DIAGNOSIS — I10 Essential (primary) hypertension: Secondary | ICD-10-CM | POA: Diagnosis not present

## 2023-09-17 DIAGNOSIS — G4700 Insomnia, unspecified: Secondary | ICD-10-CM

## 2023-09-17 DIAGNOSIS — E785 Hyperlipidemia, unspecified: Secondary | ICD-10-CM

## 2023-09-17 DIAGNOSIS — Z1211 Encounter for screening for malignant neoplasm of colon: Secondary | ICD-10-CM

## 2023-09-17 DIAGNOSIS — R1013 Epigastric pain: Secondary | ICD-10-CM | POA: Insufficient documentation

## 2023-09-17 DIAGNOSIS — G43719 Chronic migraine without aura, intractable, without status migrainosus: Secondary | ICD-10-CM | POA: Diagnosis not present

## 2023-09-17 DIAGNOSIS — Z0001 Encounter for general adult medical examination with abnormal findings: Secondary | ICD-10-CM

## 2023-09-17 DIAGNOSIS — R7303 Prediabetes: Secondary | ICD-10-CM

## 2023-09-17 DIAGNOSIS — F4323 Adjustment disorder with mixed anxiety and depressed mood: Secondary | ICD-10-CM

## 2023-09-17 NOTE — Assessment & Plan Note (Signed)
Repeat lipid panel pending. Remain off treatment. 

## 2023-09-17 NOTE — Assessment & Plan Note (Signed)
Controlled.  Continue bupropion XL 300 mg daily.

## 2023-09-17 NOTE — Progress Notes (Signed)
 Subjective:    Patient ID: Krista Carpenter, female    DOB: March 21, 1966, 58 y.o.   MRN: [id number]  HPI  Krista Carpenter is a very pleasant 58 y.o. female who presents today for complete physical and follow up of chronic conditions.  She would also like to discuss abdominal pain. Symptoms began nearly 3 weeks ago with bloating, diarrhea, epigastric pain, mild nausea without vomiting. Symptoms occur mostly with eating. She's tried taking famotidine 20 mg yesterday before lunch, had no symptoms with lunch. She did have symptoms last night with dinner. She ate roast, veggies, and potatoes. She denies tick bites over the last year. She denies esophageal burning.   Immunizations: -Tetanus: Completed in 2020  -Shingles: Completed Shingrix  series   Diet: Fair diet.  Exercise: No regular exercise.  Eye exam: Completes annually  Dental exam: Completes semi-annually    Pap Smear: Completes per GYN, UTD Mammogram: Completes with GYN annually   Colonoscopy: Completed in 2020, due May 2025. She is aware.    BP Readings from Last 3 Encounters:  09/17/23 120/86  06/21/23 136/68  05/16/23 106/60      Review of Systems  Constitutional:  Negative for fever and unexpected weight change.  HENT:  Negative for rhinorrhea.   Respiratory:  Negative for cough and shortness of breath.   Cardiovascular:  Negative for chest pain.  Gastrointestinal:  Positive for abdominal pain and diarrhea. Negative for constipation.  Genitourinary:  Negative for difficulty urinating.  Musculoskeletal:  Negative for arthralgias.  Skin:  Negative for rash.  Allergic/Immunologic: Negative for environmental allergies.  Neurological:  Positive for headaches. Negative for dizziness and numbness.  Psychiatric/Behavioral:  The patient is not nervous/anxious.          Past Medical History:  Diagnosis Date   Acute diverticulitis    Acute sinusitis 02/05/2020   AKI (acute kidney injury) (HCC) 03/02/2020    Community acquired pneumonia of right lower lobe of lung 03/11/2020   Diverticula of colon    sigmoid and descending colon   Diverticulitis 11/04/2018   History of abnormal cervical Pap smear    IBS (irritable bowel syndrome)    Kidney infection 02/2020   Migraine    Pyelonephritis of left kidney 03/02/2020   Sepsis (HCC) 03/02/2020   Transaminitis 03/02/2020    Social History   Socioeconomic History   Marital status: Married    Spouse name: Not on file   Number of children: Not on file   Years of education: Not on file   Highest education level: Not on file  Occupational History   Not on file  Tobacco Use   Smoking status: Never   Smokeless tobacco: Never  Vaping Use   Vaping status: Never Used  Substance and Sexual Activity   Alcohol use: Yes    Alcohol/week: 0.0 standard drinks of alcohol    Comment: occasion   Drug use: No   Sexual activity: Yes    Partners: Male    Birth control/protection: I.U.D.  Other Topics Concern   Not on file  Social History Narrative   Married.   2 children.   Works at [employer].   Enjoys scrapbooking, reading.   Right handed   Social Drivers of Corporate investment banker Strain: Not on file  Food Insecurity: Not on file  Transportation Needs: Not on file  Physical Activity: Not on file  Stress: Not on file  Social Connections: Not on file  Intimate Partner Violence: Not on file  Past Surgical History:  Procedure Laterality Date   CHOLECYSTECTOMY     colonoscopy     DILATION AND CURETTAGE OF UTERUS     Receeding Gum repair  04/2023   TONSILLECTOMY     WRIST ARTHROCENTESIS      Family History  Problem Relation Age of Onset   Cancer Mother        skin   Irritable bowel syndrome Mother    Diverticulosis Mother    Diabetes Father    Diabetes Brother    Diabetes Maternal Uncle     Allergies  Allergen Reactions   Hydrocodone  Itching   Oxycodone  Itching    Current Outpatient Medications on File Prior to  Visit  Medication Sig Dispense Refill   botulinum toxin Type A  (BOTOX ) 200 units injection Inject 155 units IM into multiple site in the face,neck and head once every 90 days 1 each 4   buPROPion  (WELLBUTRIN  XL) 300 MG 24 hr tablet Take 1 tablet (300 mg total) by mouth daily. For depression 90 tablet 0   conjugated estrogens  (PREMARIN ) vaginal cream Place 0.5 Applicatorfuls vaginally 2 (two) times a week. 30 g 2   estradiol -norethindrone  (COMBIPATCH ) 0.05-0.14 MG/DAY Place 1 patch onto the skin 2 (two) times a week. 24 patch 2   Galcanezumab -gnlm (EMGALITY ) 120 MG/ML SOAJ Inject 120 mg into the skin every 30 (thirty) days. 1 mL 3   losartan  (COZAAR ) 100 MG tablet Take 1 tablet (100 mg total) by mouth daily. for blood pressure 90 tablet 2   propranolol  ER (INDERAL  LA) 120 MG 24 hr capsule Take 1 capsule (120 mg total) by mouth daily for headache prevention. 90 capsule 0   topiramate  (TOPAMAX ) 50 MG tablet Take 1 tablet (50 mg total) by mouth at bedtime. 30 tablet 5   traZODone  (DESYREL ) 100 MG tablet Take 1 tablet (100 mg total) by mouth at bedtime for sleep 90 tablet 0   Ubrogepant  (UBRELVY ) 100 MG TABS Take 1 tablet (100 mg total) by mouth as needed. May repeat after 2 hours.  Maximum 2 tablets in 24 hours. 16 tablet 11   No current facility-administered medications on file prior to visit.    BP 120/86   Pulse 90   Temp (!) 97.2 F (36.2 C) (Temporal)   Ht 5' (1.524 m)   Wt 152 lb (68.9 kg)   LMP 12/02/2012   SpO2 100%   BMI 29.69 kg/m  Objective:   Physical Exam HENT:     Right Ear: Tympanic membrane and ear canal normal.     Left Ear: Tympanic membrane and ear canal normal.  Eyes:     Pupils: Pupils are equal, round, and reactive to light.  Cardiovascular:     Rate and Rhythm: Normal rate and regular rhythm.  Pulmonary:     Effort: Pulmonary effort is normal.     Breath sounds: Normal breath sounds.  Abdominal:     General: Bowel sounds are normal.     Palpations: Abdomen  is soft.     Tenderness: There is no abdominal tenderness.  Musculoskeletal:        General: Normal range of motion.     Cervical back: Neck supple.  Skin:    General: Skin is warm and dry.  Neurological:     Mental Status: She is alert and oriented to person, place, and time.     Cranial Nerves: No cranial nerve deficit.     Deep Tendon Reflexes:     Reflex Scores:  Patellar reflexes are 2+ on the right side and 2+ on the left side. Psychiatric:        Mood and Affect: Mood normal.           Assessment & Plan:  Encounter for annual general medical examination with abnormal findings in adult Assessment & Plan: Immunizations UTD. Pap smear UTD.  Follows with GYN Mammogram UTD, follows with GYN Colonoscopy due, referral placed to GI.  Discussed the importance of a healthy diet and regular exercise in order for weight loss, and to reduce the risk of further co-morbidity.  Exam stable. Labs pending.  Follow up in 1 year for repeat physical.    Essential hypertension Assessment & Plan: Controlled.  Continue losartan  100 mg daily.  CMP pending.  Orders: -     Comprehensive metabolic panel with GFR  Intractable chronic migraine without aura and without status migrainosus Assessment & Plan: Waxes and wanes.   Following with neurology. Continue Emgality  120 mg monthly, Boxtox injections every 3 months, propranolol  ER 120 mg daily, Topamax  50 mg daily, Ubrelvy  100 mg PRN. We did discuss to speak with neurology regarding the need for both propranolol  and Topamax . She will do so.   Screening for colon cancer -     Ambulatory referral to Gastroenterology  Epigastric pain Assessment & Plan: Differentials include esophageal reflux, ulcer, alpha gal allergy, pancreatitis.  Labs pending today including lipase, CBC with differential, H. pylori breath test, alpha gal allergy.  Discussed use of famotidine once to twice daily if needed.  We also discussed to avoid  mammalian food for now.  Orders: -     Alpha-Gal Panel -     H. pylori breath test -     Comprehensive metabolic panel with GFR -     CBC with Differential/Platelet -     Lipase  Prediabetes Assessment & Plan: Repeat A1c pending.  Orders: -     Hemoglobin A1c  Hyperlipidemia, unspecified hyperlipidemia type Assessment & Plan: Repeat lipid panel pending. Remain off treatment.  Orders: -     Lipid panel  Adjustment disorder with mixed anxiety and depressed mood Assessment & Plan: Controlled.  Continue bupropion  XL 300 mg daily.   Insomnia, unspecified type Assessment & Plan: Controlled.  Continue trazodone  100 mg at bedtime.         Sapna Padron K Catie Chiao, NP

## 2023-09-17 NOTE — Assessment & Plan Note (Signed)
 Repeat A1c pending

## 2023-09-17 NOTE — Assessment & Plan Note (Signed)
Controlled.  Continue trazodone 100 mg at bedtime.

## 2023-09-17 NOTE — Patient Instructions (Signed)
 Stop by the lab prior to leaving today. I will notify you of your results once received.   You will receive a phone call regarding the colonoscopy.  It was a pleasure to see you today!

## 2023-09-17 NOTE — Assessment & Plan Note (Signed)
 Immunizations UTD. Pap smear UTD.  Follows with GYN Mammogram UTD, follows with GYN Colonoscopy due, referral placed to GI.  Discussed the importance of a healthy diet and regular exercise in order for weight loss, and to reduce the risk of further co-morbidity.  Exam stable. Labs pending.  Follow up in 1 year for repeat physical.

## 2023-09-17 NOTE — Assessment & Plan Note (Signed)
 Waxes and wanes.   Following with neurology. Continue Emgality  120 mg monthly, Boxtox injections every 3 months, propranolol  ER 120 mg daily, Topamax  50 mg daily, Ubrelvy  100 mg PRN. We did discuss to speak with neurology regarding the need for both propranolol  and Topamax . She will do so.

## 2023-09-17 NOTE — Assessment & Plan Note (Signed)
Controlled.  Continue losartan 100 mg daily. CMP pending.  

## 2023-09-17 NOTE — Assessment & Plan Note (Signed)
 Differentials include esophageal reflux, ulcer, alpha gal allergy, pancreatitis.  Labs pending today including lipase, CBC with differential, H. pylori breath test, alpha gal allergy.  Discussed use of famotidine once to twice daily if needed.  We also discussed to avoid mammalian food for now.

## 2023-09-18 ENCOUNTER — Encounter: Payer: Self-pay | Admitting: *Deleted

## 2023-09-18 LAB — LIPID PANEL
Cholesterol: 188 mg/dL (ref 0–200)
HDL: 35.2 mg/dL — ABNORMAL LOW (ref >39.00)
LDL Cholesterol: 106 mg/dL — ABNORMAL HIGH (ref 0–99)
NonHDL: 152.51
Total CHOL/HDL Ratio: 5
Triglycerides: 231 mg/dL — ABNORMAL HIGH (ref 0.0–149.0)
VLDL: 46.2 mg/dL — ABNORMAL HIGH (ref 0.0–40.0)

## 2023-09-18 LAB — COMPREHENSIVE METABOLIC PANEL WITH GFR
ALT: 15 U/L (ref 0–35)
AST: 17 U/L (ref 0–37)
Albumin: 4.8 g/dL (ref 3.5–5.2)
Alkaline Phosphatase: 44 U/L (ref 39–117)
BUN: 20 mg/dL (ref 6–23)
CO2: 26 meq/L (ref 19–32)
Calcium: 9.6 mg/dL (ref 8.4–10.5)
Chloride: 104 meq/L (ref 96–112)
Creatinine, Ser: 1.24 mg/dL — ABNORMAL HIGH (ref 0.40–1.20)
GFR: 48.62 mL/min — ABNORMAL LOW (ref >60.00)
Glucose, Bld: 82 mg/dL (ref 70–99)
Potassium: 4.3 meq/L (ref 3.5–5.1)
Sodium: 137 meq/L (ref 135–145)
Total Bilirubin: 0.3 mg/dL (ref 0.2–1.2)
Total Protein: 6.9 g/dL (ref 6.0–8.3)

## 2023-09-18 LAB — CBC WITH DIFFERENTIAL/PLATELET
Basophils Absolute: 0.1 10*3/uL (ref 0.0–0.1)
Basophils Relative: 1.1 % (ref 0.0–3.0)
Eosinophils Absolute: 0.2 10*3/uL (ref 0.0–0.7)
Eosinophils Relative: 2.8 % (ref 0.0–5.0)
HCT: 39.7 % (ref 36.0–46.0)
Hemoglobin: 13.4 g/dL (ref 12.0–15.0)
Lymphocytes Relative: 35.7 % (ref 12.0–46.0)
Lymphs Abs: 2.3 10*3/uL (ref 0.7–4.0)
MCHC: 33.8 g/dL (ref 30.0–36.0)
MCV: 88 fl (ref 78.0–100.0)
Monocytes Absolute: 0.6 10*3/uL (ref 0.1–1.0)
Monocytes Relative: 8.7 % (ref 3.0–12.0)
Neutro Abs: 3.3 10*3/uL (ref 1.4–7.7)
Neutrophils Relative %: 51.7 % (ref 43.0–77.0)
Platelets: 297 10*3/uL (ref 150.0–400.0)
RBC: 4.52 Mil/uL (ref 3.87–5.11)
RDW: 13.4 % (ref 11.5–15.5)
WBC: 6.4 10*3/uL (ref 4.0–10.5)

## 2023-09-18 LAB — HEMOGLOBIN A1C: Hgb A1c MFr Bld: 5.7 % (ref 4.6–6.5)

## 2023-09-18 LAB — H. PYLORI BREATH TEST: H. pylori Breath Test: NOT DETECTED

## 2023-09-18 LAB — LIPASE: Lipase: 57 U/L (ref 11.0–59.0)

## 2023-09-19 ENCOUNTER — Ambulatory Visit: Payer: Self-pay | Admitting: Primary Care

## 2023-09-19 DIAGNOSIS — N289 Disorder of kidney and ureter, unspecified: Secondary | ICD-10-CM

## 2023-09-20 ENCOUNTER — Other Ambulatory Visit: Payer: Self-pay | Admitting: Neurology

## 2023-09-20 ENCOUNTER — Other Ambulatory Visit: Payer: Self-pay

## 2023-09-20 LAB — ALPHA-GAL PANEL
Allergen, Mutton, f88: <0.1 kU/L
Allergen, Pork, f26: <0.1 kU/L
Beef: <0.1 kU/L
CLASS: 0
CLASS: 0
Class: 0
GALACTOSE-ALPHA-1,3-GALACTOSE IGE*: <0.1 kU/L (ref <0.10)

## 2023-09-20 LAB — INTERPRETATION:

## 2023-09-20 MED ORDER — EMGALITY 120 MG/ML ~~LOC~~ SOAJ
120.0000 mg | SUBCUTANEOUS | 0 refills | Status: DC
  Filled 2023-09-20 (×2): qty 1, 30d supply, fill #0

## 2023-09-23 ENCOUNTER — Other Ambulatory Visit (HOSPITAL_COMMUNITY): Payer: Self-pay

## 2023-09-30 ENCOUNTER — Other Ambulatory Visit (INDEPENDENT_AMBULATORY_CARE_PROVIDER_SITE_OTHER)

## 2023-09-30 ENCOUNTER — Ambulatory Visit: Payer: Self-pay | Admitting: Primary Care

## 2023-09-30 DIAGNOSIS — N289 Disorder of kidney and ureter, unspecified: Secondary | ICD-10-CM

## 2023-09-30 LAB — BASIC METABOLIC PANEL WITH GFR
BUN: 16 mg/dL (ref 6–23)
CO2: 25 meq/L (ref 19–32)
Calcium: 9.1 mg/dL (ref 8.4–10.5)
Chloride: 106 meq/L (ref 96–112)
Creatinine, Ser: 1.17 mg/dL (ref 0.40–1.20)
GFR: 52.12 mL/min — ABNORMAL LOW (ref >60.00)
Glucose, Bld: 98 mg/dL (ref 70–99)
Potassium: 4.2 meq/L (ref 3.5–5.1)
Sodium: 138 meq/L (ref 135–145)

## 2023-10-04 ENCOUNTER — Ambulatory Visit: Payer: 59 | Admitting: Neurology

## 2023-10-04 DIAGNOSIS — G43709 Chronic migraine without aura, not intractable, without status migrainosus: Secondary | ICD-10-CM

## 2023-10-04 MED ORDER — ONABOTULINUMTOXINA 100 UNITS IJ SOLR
200.0000 [IU] | Freq: Once | INTRAMUSCULAR | Status: AC
  Administered 2023-10-04: 155 [IU] via INTRAMUSCULAR

## 2023-10-04 NOTE — Progress Notes (Signed)

## 2023-10-08 ENCOUNTER — Ambulatory Visit: Payer: 59 | Admitting: Neurology

## 2023-10-14 ENCOUNTER — Other Ambulatory Visit (INDEPENDENT_AMBULATORY_CARE_PROVIDER_SITE_OTHER)

## 2023-10-14 ENCOUNTER — Other Ambulatory Visit: Payer: Self-pay

## 2023-10-14 ENCOUNTER — Other Ambulatory Visit: Payer: Self-pay | Admitting: Primary Care

## 2023-10-14 ENCOUNTER — Ambulatory Visit: Payer: Self-pay | Admitting: Primary Care

## 2023-10-14 DIAGNOSIS — F4323 Adjustment disorder with mixed anxiety and depressed mood: Secondary | ICD-10-CM

## 2023-10-14 DIAGNOSIS — N289 Disorder of kidney and ureter, unspecified: Secondary | ICD-10-CM

## 2023-10-14 DIAGNOSIS — I10 Essential (primary) hypertension: Secondary | ICD-10-CM

## 2023-10-14 LAB — BASIC METABOLIC PANEL WITH GFR
BUN: 15 mg/dL (ref 6–23)
CO2: 27 meq/L (ref 19–32)
Calcium: 9.8 mg/dL (ref 8.4–10.5)
Chloride: 103 meq/L (ref 96–112)
Creatinine, Ser: 1.06 mg/dL (ref 0.40–1.20)
GFR: 58.66 mL/min — ABNORMAL LOW (ref >60.00)
Glucose, Bld: 101 mg/dL — ABNORMAL HIGH (ref 70–99)
Potassium: 4.4 meq/L (ref 3.5–5.1)
Sodium: 139 meq/L (ref 135–145)

## 2023-10-14 MED ORDER — LOSARTAN POTASSIUM 100 MG PO TABS
100.0000 mg | ORAL_TABLET | Freq: Every day | ORAL | 2 refills | Status: AC
  Filled 2023-10-14: qty 90, 90d supply, fill #0
  Filled 2024-02-07: qty 90, 90d supply, fill #1
  Filled 2024-05-09: qty 90, 90d supply, fill #2

## 2023-10-14 MED ORDER — BUPROPION HCL ER (XL) 300 MG PO TB24
300.0000 mg | ORAL_TABLET | Freq: Every day | ORAL | 2 refills | Status: AC
  Filled 2023-10-14: qty 90, 90d supply, fill #0
  Filled 2024-02-07: qty 90, 90d supply, fill #1
  Filled 2024-05-09: qty 90, 90d supply, fill #2

## 2023-10-24 ENCOUNTER — Ambulatory Visit: Payer: 59 | Admitting: Neurology

## 2023-11-05 ENCOUNTER — Telehealth: Admitting: Physician Assistant

## 2023-11-05 DIAGNOSIS — J019 Acute sinusitis, unspecified: Secondary | ICD-10-CM | POA: Diagnosis not present

## 2023-11-05 DIAGNOSIS — B9689 Other specified bacterial agents as the cause of diseases classified elsewhere: Secondary | ICD-10-CM

## 2023-11-06 ENCOUNTER — Other Ambulatory Visit: Payer: Self-pay

## 2023-11-06 MED ORDER — AMOXICILLIN-POT CLAVULANATE 875-125 MG PO TABS
1.0000 | ORAL_TABLET | Freq: Two times a day (BID) | ORAL | 0 refills | Status: AC
  Filled 2023-11-06: qty 14, 7d supply, fill #0

## 2023-11-06 NOTE — Progress Notes (Signed)

## 2023-11-11 ENCOUNTER — Other Ambulatory Visit: Payer: Self-pay

## 2023-11-11 ENCOUNTER — Other Ambulatory Visit: Payer: Self-pay | Admitting: Neurology

## 2023-11-12 ENCOUNTER — Other Ambulatory Visit: Payer: Self-pay

## 2023-11-13 ENCOUNTER — Telehealth: Admitting: Physician Assistant

## 2023-11-13 ENCOUNTER — Other Ambulatory Visit: Payer: Self-pay

## 2023-11-13 DIAGNOSIS — J019 Acute sinusitis, unspecified: Secondary | ICD-10-CM | POA: Diagnosis not present

## 2023-11-13 DIAGNOSIS — B9689 Other specified bacterial agents as the cause of diseases classified elsewhere: Secondary | ICD-10-CM

## 2023-11-13 MED ORDER — DOXYCYCLINE HYCLATE 100 MG PO TABS
100.0000 mg | ORAL_TABLET | Freq: Two times a day (BID) | ORAL | 0 refills | Status: AC
Start: 2023-11-13 — End: ?
  Filled 2023-11-13: qty 20, 10d supply, fill #0

## 2023-11-13 NOTE — Progress Notes (Signed)

## 2023-11-14 ENCOUNTER — Other Ambulatory Visit: Payer: Self-pay

## 2023-11-14 MED FILL — Topiramate Tab 50 MG: ORAL | 30 days supply | Qty: 30 | Fill #0 | Status: CN

## 2023-11-14 MED FILL — Galcanezumab-gnlm Subcutaneous Soln Auto-Injector 120 MG/ML: SUBCUTANEOUS | 30 days supply | Qty: 1 | Fill #0 | Status: CN

## 2023-11-21 ENCOUNTER — Other Ambulatory Visit: Payer: Self-pay | Admitting: Primary Care

## 2023-11-21 ENCOUNTER — Other Ambulatory Visit: Payer: Self-pay

## 2023-11-21 DIAGNOSIS — G4700 Insomnia, unspecified: Secondary | ICD-10-CM

## 2023-11-21 MED ORDER — TRAZODONE HCL 100 MG PO TABS
100.0000 mg | ORAL_TABLET | Freq: Every evening | ORAL | 2 refills | Status: AC
  Filled 2023-11-21: qty 90, 90d supply, fill #0
  Filled 2024-02-07: qty 90, 90d supply, fill #1
  Filled 2024-05-20: qty 90, 90d supply, fill #0

## 2023-11-24 DIAGNOSIS — F4323 Adjustment disorder with mixed anxiety and depressed mood: Secondary | ICD-10-CM

## 2023-11-25 ENCOUNTER — Other Ambulatory Visit: Payer: Self-pay

## 2023-11-25 MED ORDER — ESCITALOPRAM OXALATE 10 MG PO TABS
10.0000 mg | ORAL_TABLET | Freq: Every day | ORAL | 0 refills | Status: DC
  Filled 2023-11-25: qty 90, 90d supply, fill #0

## 2023-11-26 ENCOUNTER — Other Ambulatory Visit: Payer: Self-pay

## 2023-11-26 MED FILL — Topiramate Tab 50 MG: ORAL | 30 days supply | Qty: 30 | Fill #0 | Status: AC

## 2023-11-26 MED FILL — Galcanezumab-gnlm Subcutaneous Soln Auto-Injector 120 MG/ML: SUBCUTANEOUS | 30 days supply | Qty: 1 | Fill #0 | Status: AC

## 2023-11-27 ENCOUNTER — Other Ambulatory Visit: Payer: Self-pay

## 2023-12-04 ENCOUNTER — Ambulatory Visit (INDEPENDENT_AMBULATORY_CARE_PROVIDER_SITE_OTHER)

## 2023-12-04 ENCOUNTER — Ambulatory Visit: Admitting: Podiatry

## 2023-12-04 ENCOUNTER — Other Ambulatory Visit: Payer: Self-pay

## 2023-12-04 DIAGNOSIS — M19071 Primary osteoarthritis, right ankle and foot: Secondary | ICD-10-CM

## 2023-12-04 DIAGNOSIS — M722 Plantar fascial fibromatosis: Secondary | ICD-10-CM

## 2023-12-04 DIAGNOSIS — L600 Ingrowing nail: Secondary | ICD-10-CM | POA: Diagnosis not present

## 2023-12-04 MED ORDER — NEOMYCIN-POLYMYXIN-HC 3.5-10000-1 OT SOLN
OTIC | 0 refills | Status: AC
  Filled 2023-12-04 (×2): qty 10, 30d supply, fill #0

## 2023-12-04 NOTE — Patient Instructions (Signed)

## 2023-12-05 NOTE — Progress Notes (Signed)
 She presents today states that her plantar fasciitis is coming back in her left foot and she has pain to her right hallux again it feels like it is right down and here as she refers to her ingrown toenail area tibial border hallux right.  Objective: I reviewed her past medical history medications allergies surgeries and social history.  Pulses are palpable.  Neurologic sensorium is intact.  She does have some tenderness on palpation MucoClear tubercle of her left heel.  Sharp incurvated nail margin along the tibial border of the hallux right.  Mild erythema no purulence no malodor.  Assessment: Ingrown toenail tibial border hallux right plan fasciitis left.  Plan: Injected the left heel today 20 mg Kenalog  5 mg Marcaine point maximal tenderness.  Also performed chemical matricectomy to the tibial border of the hallux right after local anesthetic was administered.  She tolerated this procedure well as the toe was prepped.  The nail was then split from distal to proximal along the tibial border of the right hallux exposing the matrix.  Once exposed 3 applications of phenol was applied to the matrix and the nailbed 30 seconds each neutralized with isopropyl alcohol Silvadene cream Telfa pad and a dry sterile compressive dressing was applied.  She was provided with both oral and written home-going instructions for the care and soaking of the toe as well as a prescription for Cortisporin  Otic to be applied twice daily after soaking.  She understands this and is amenable to it and I will follow-up with her in a couple of weeks.

## 2023-12-09 ENCOUNTER — Other Ambulatory Visit: Payer: Self-pay | Admitting: Primary Care

## 2023-12-09 ENCOUNTER — Telehealth: Payer: Self-pay

## 2023-12-09 ENCOUNTER — Other Ambulatory Visit: Payer: Self-pay

## 2023-12-09 DIAGNOSIS — R519 Headache, unspecified: Secondary | ICD-10-CM

## 2023-12-09 NOTE — Telephone Encounter (Signed)
 PA needed for Botox  exipred 09/2023

## 2023-12-10 ENCOUNTER — Other Ambulatory Visit: Payer: Self-pay

## 2023-12-10 MED FILL — Propranolol HCl Cap ER 24HR 120 MG: ORAL | 90 days supply | Qty: 90 | Fill #0 | Status: AC

## 2023-12-19 ENCOUNTER — Other Ambulatory Visit: Payer: Self-pay | Admitting: Pharmacy Technician

## 2023-12-19 ENCOUNTER — Other Ambulatory Visit: Payer: Self-pay

## 2023-12-24 ENCOUNTER — Other Ambulatory Visit: Payer: Self-pay

## 2023-12-25 ENCOUNTER — Other Ambulatory Visit: Payer: Self-pay

## 2023-12-25 ENCOUNTER — Telehealth: Payer: Self-pay | Admitting: Pharmacy Technician

## 2023-12-25 ENCOUNTER — Other Ambulatory Visit (HOSPITAL_COMMUNITY): Payer: Self-pay

## 2023-12-25 NOTE — Telephone Encounter (Signed)
 Pharmacy Patient Advocate Encounter   Received notification from Pt Calls Messages that prior authorization for BOTOX  200 is required/requested.   Insurance verification completed.   The patient is insured through [facility] .   Per test claim: PA required; PA submitted to above mentioned insurance via Latent Key/confirmation #/EOC [id number] Status is pending

## 2023-12-25 NOTE — Telephone Encounter (Signed)
 PA has been submitted, and telephone encounter has been created. Please see telephone encounter dated 9.3.25.

## 2023-12-27 ENCOUNTER — Other Ambulatory Visit (HOSPITAL_COMMUNITY): Payer: Self-pay

## 2023-12-27 ENCOUNTER — Other Ambulatory Visit: Payer: Self-pay

## 2023-12-27 NOTE — Telephone Encounter (Signed)
 Pharmacy Patient Advocate Encounter- Injection via Pharmacy Benefit:  PA was submitted  for Botox - [phone] to [facility] and has been approved through: 9.4.25 - 9.3.26 Authorization# [id number]   Please send prescription to Specialty Pharmacy: [facility]: [phone]  Estimated Pharmacy Copay is: $0  Patient is eligible for Botox - [id number] Copay Card, which will make patient's copay as little as zero. Copay card will be provided to pharmacy.   Admin Code: 35384   Does Not require Prior Auth.

## 2023-12-27 NOTE — Progress Notes (Signed)
 Specialty Pharmacy Refill Coordination Note  Krista Carpenter is a 58 y.o. female assessed today regarding refills of clinic administered specialty medication(s) OnabotulinumtoxinA  (BOTOX )   Clinic requested Delivery   Delivery date: 12/30/23   Verified address: [facility]  [address] [place] [address]   Medication will be filled on 09.05.25.   Appointment 9.12.25  Copay: $0.00

## 2023-12-27 NOTE — Progress Notes (Signed)
 Ready to fill

## 2024-01-03 ENCOUNTER — Ambulatory Visit (INDEPENDENT_AMBULATORY_CARE_PROVIDER_SITE_OTHER): Admitting: Neurology

## 2024-01-03 DIAGNOSIS — G43709 Chronic migraine without aura, not intractable, without status migrainosus: Secondary | ICD-10-CM | POA: Diagnosis not present

## 2024-01-03 MED ORDER — ONABOTULINUMTOXINA 100 UNITS IJ SOLR
200.0000 [IU] | Freq: Once | INTRAMUSCULAR | Status: AC
  Administered 2024-01-03: 155 [IU] via INTRAMUSCULAR

## 2024-01-03 NOTE — Progress Notes (Signed)

## 2024-01-06 ENCOUNTER — Other Ambulatory Visit: Payer: Self-pay

## 2024-01-06 DIAGNOSIS — Z01419 Encounter for gynecological examination (general) (routine) without abnormal findings: Secondary | ICD-10-CM | POA: Diagnosis not present

## 2024-01-06 DIAGNOSIS — Z1231 Encounter for screening mammogram for malignant neoplasm of breast: Secondary | ICD-10-CM | POA: Diagnosis not present

## 2024-01-06 DIAGNOSIS — F329 Major depressive disorder, single episode, unspecified: Secondary | ICD-10-CM | POA: Diagnosis not present

## 2024-01-06 DIAGNOSIS — N952 Postmenopausal atrophic vaginitis: Secondary | ICD-10-CM | POA: Diagnosis not present

## 2024-01-06 DIAGNOSIS — Z6829 Body mass index (BMI) 29.0-29.9, adult: Secondary | ICD-10-CM | POA: Diagnosis not present

## 2024-01-06 DIAGNOSIS — N951 Menopausal and female climacteric states: Secondary | ICD-10-CM | POA: Diagnosis not present

## 2024-01-06 DIAGNOSIS — I10 Essential (primary) hypertension: Secondary | ICD-10-CM | POA: Diagnosis not present

## 2024-01-06 DIAGNOSIS — Z13820 Encounter for screening for osteoporosis: Secondary | ICD-10-CM | POA: Diagnosis not present

## 2024-01-06 MED ORDER — COMBIPATCH 0.05-0.14 MG/DAY TD PTTW
1.0000 | MEDICATED_PATCH | TRANSDERMAL | 3 refills | Status: AC
  Filled 2024-01-06: qty 24, 84d supply, fill #0
  Filled 2024-01-09: qty 8, 28d supply, fill #0
  Filled 2024-03-08: qty 8, 28d supply, fill #1
  Filled 2024-04-29: qty 8, 28d supply, fill #2

## 2024-01-08 MED FILL — Topiramate Tab 50 MG: ORAL | 30 days supply | Qty: 30 | Fill #1 | Status: AC

## 2024-01-08 MED FILL — Galcanezumab-gnlm Subcutaneous Soln Auto-Injector 120 MG/ML: SUBCUTANEOUS | 30 days supply | Qty: 1 | Fill #1 | Status: CN

## 2024-01-09 ENCOUNTER — Other Ambulatory Visit: Payer: Self-pay

## 2024-01-09 MED FILL — Galcanezumab-gnlm Subcutaneous Soln Auto-Injector 120 MG/ML: SUBCUTANEOUS | 30 days supply | Qty: 1 | Fill #1 | Status: CN

## 2024-01-10 ENCOUNTER — Other Ambulatory Visit (HOSPITAL_COMMUNITY): Payer: Self-pay

## 2024-01-13 ENCOUNTER — Ambulatory Visit: Admitting: Podiatry

## 2024-01-15 ENCOUNTER — Other Ambulatory Visit: Payer: Self-pay

## 2024-01-15 ENCOUNTER — Other Ambulatory Visit (HOSPITAL_COMMUNITY): Payer: Self-pay

## 2024-01-15 ENCOUNTER — Telehealth: Payer: Self-pay | Admitting: Pharmacy Technician

## 2024-01-15 MED FILL — Galcanezumab-gnlm Subcutaneous Soln Auto-Injector 120 MG/ML: SUBCUTANEOUS | 30 days supply | Qty: 1 | Fill #1 | Status: AC

## 2024-01-15 NOTE — Telephone Encounter (Signed)
 Pharmacy Patient Advocate Encounter  Received notification from [employer] that Prior Authorization for EMGALITY  120MG  has been APPROVED from 9.24.25 to 9.23.26. Ran test claim, Copay is $35. This test claim was processed through [facility]- copay amounts may vary at other pharmacies due to pharmacy/plan contracts, or as the patient moves through the different stages of their insurance plan.   PA #/Case ID/Reference #: [id number]

## 2024-01-15 NOTE — Telephone Encounter (Signed)
 Pharmacy Patient Advocate Encounter   Received notification from Fax that prior authorization for EMGALITY  120MG  is required/requested.   Insurance verification completed.   The patient is insured through [facility] .   Per test claim: PA required; PA submitted to above mentioned insurance via Latent Key/confirmation #/EOC [id number] Status is pending

## 2024-02-05 ENCOUNTER — Ambulatory Visit: Admitting: Podiatry

## 2024-02-05 DIAGNOSIS — M722 Plantar fascial fibromatosis: Secondary | ICD-10-CM | POA: Diagnosis not present

## 2024-02-05 MED ORDER — TRIAMCINOLONE ACETONIDE 40 MG/ML IJ SUSP
20.0000 mg | Freq: Once | INTRAMUSCULAR | Status: AC
  Administered 2024-02-05: 20 mg

## 2024-02-06 NOTE — Progress Notes (Signed)
 She presents today for follow-up of her matricectomy hallux right which she states is doing just great.  No problems whatsoever continues to soak regularly and apply Band-Aid if necessary.  She states that she continues the drops as well.  Does relate a nodule in the medial longitudinal arch of her left foot.  Objective: Vital signs stable alert oriented x 3 hallux right matrixectomy appears to be healing very nicely there is no erythema edema salines drainage odor no signs of infection.  No tenderness on palpation.  Contralateral foot does demonstrate a greater than 1 cm nonpulsatile nodular mass which is firm on palpation along the medial band of the plantar fascia just proximal to the sesamoids extending to the medial longitudinal arch.  She also has a very small lesion just along the distal medial longitudinal arch and the central band of the plantar fascia.  Nontender on palpation.  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  Plan: Injected the left plantar fibroma which is symptomatic.  Tolerated procedure well.  Follow-up with her in 2 months if necessary

## 2024-02-07 ENCOUNTER — Other Ambulatory Visit: Payer: Self-pay | Admitting: Neurology

## 2024-02-07 MED FILL — Galcanezumab-gnlm Subcutaneous Soln Auto-Injector 120 MG/ML: SUBCUTANEOUS | 30 days supply | Qty: 1 | Fill #2 | Status: AC

## 2024-02-08 ENCOUNTER — Other Ambulatory Visit: Payer: Self-pay

## 2024-02-09 ENCOUNTER — Other Ambulatory Visit: Payer: Self-pay

## 2024-02-10 ENCOUNTER — Other Ambulatory Visit: Payer: Self-pay | Admitting: Neurology

## 2024-02-10 ENCOUNTER — Other Ambulatory Visit: Payer: Self-pay

## 2024-02-11 ENCOUNTER — Other Ambulatory Visit: Payer: Self-pay

## 2024-02-11 MED ORDER — TOPIRAMATE 50 MG PO TABS
50.0000 mg | ORAL_TABLET | Freq: Every day | ORAL | 5 refills | Status: AC
  Filled 2024-02-11: qty 30, 30d supply, fill #0
  Filled 2024-03-12: qty 30, 30d supply, fill #1
  Filled 2024-04-29: qty 30, 30d supply, fill #2
  Filled 2024-05-09: qty 30, 30d supply, fill #3
  Filled 2024-05-20: qty 30, 30d supply, fill #0
  Filled 2024-05-25: qty 90, 90d supply, fill #0

## 2024-03-08 ENCOUNTER — Other Ambulatory Visit: Payer: Self-pay | Admitting: Primary Care

## 2024-03-08 ENCOUNTER — Other Ambulatory Visit: Payer: Self-pay | Admitting: Neurology

## 2024-03-08 DIAGNOSIS — F4323 Adjustment disorder with mixed anxiety and depressed mood: Secondary | ICD-10-CM

## 2024-03-08 MED ORDER — ESCITALOPRAM OXALATE 10 MG PO TABS
10.0000 mg | ORAL_TABLET | Freq: Every day | ORAL | 1 refills | Status: AC
  Filled 2024-03-08: qty 90, 90d supply, fill #0
  Filled 2024-05-09: qty 90, 90d supply, fill #1
  Filled 2024-05-28: qty 90, 90d supply, fill #0

## 2024-03-08 MED FILL — Propranolol HCl Cap ER 24HR 120 MG: ORAL | 90 days supply | Qty: 90 | Fill #1 | Status: AC

## 2024-03-09 ENCOUNTER — Other Ambulatory Visit: Payer: Self-pay

## 2024-03-09 MED ORDER — EMGALITY 120 MG/ML ~~LOC~~ SOAJ
120.0000 mg | SUBCUTANEOUS | 0 refills | Status: DC
  Filled 2024-03-09: qty 1, 30d supply, fill #0

## 2024-03-13 ENCOUNTER — Other Ambulatory Visit: Payer: Self-pay

## 2024-03-17 ENCOUNTER — Other Ambulatory Visit: Payer: Self-pay

## 2024-03-25 ENCOUNTER — Other Ambulatory Visit: Payer: Self-pay

## 2024-03-25 NOTE — Progress Notes (Signed)
 Specialty Pharmacy Refill Coordination Note  Candee LAQUESHA HOLCOMB is a 58 y.o. female assessed today regarding refills of clinic administered specialty medication(s) OnabotulinumtoxinA  (BOTOX )   Clinic requested Courier to Provider Office   Delivery date: 03/30/24   Verified address: [facility]  [address] [place] [address]   Medication will be filled on: 03/27/24   Appointment 1212/25.

## 2024-03-27 ENCOUNTER — Other Ambulatory Visit: Payer: Self-pay

## 2024-04-01 ENCOUNTER — Other Ambulatory Visit: Payer: Self-pay

## 2024-04-01 ENCOUNTER — Other Ambulatory Visit: Payer: Self-pay | Admitting: Neurology

## 2024-04-01 MED ORDER — EMGALITY 120 MG/ML ~~LOC~~ SOAJ
120.0000 mg | SUBCUTANEOUS | 0 refills | Status: DC
  Filled 2024-04-01 – 2024-04-02 (×2): qty 1, 30d supply, fill #0

## 2024-04-02 ENCOUNTER — Other Ambulatory Visit: Payer: Self-pay

## 2024-04-03 ENCOUNTER — Ambulatory Visit: Admitting: Neurology

## 2024-04-03 DIAGNOSIS — G43709 Chronic migraine without aura, not intractable, without status migrainosus: Secondary | ICD-10-CM

## 2024-04-03 MED ORDER — ONABOTULINUMTOXINA 100 UNITS IJ SOLR
200.0000 [IU] | Freq: Once | INTRAMUSCULAR | Status: AC
  Administered 2024-04-03: 155 [IU] via INTRAMUSCULAR

## 2024-04-03 NOTE — Progress Notes (Signed)

## 2024-04-06 ENCOUNTER — Ambulatory Visit: Admitting: Podiatry

## 2024-04-30 ENCOUNTER — Other Ambulatory Visit: Payer: Self-pay

## 2024-05-08 ENCOUNTER — Other Ambulatory Visit (HOSPITAL_COMMUNITY): Payer: Self-pay

## 2024-05-09 ENCOUNTER — Other Ambulatory Visit: Payer: Self-pay

## 2024-05-09 ENCOUNTER — Other Ambulatory Visit: Payer: Self-pay | Admitting: Neurology

## 2024-05-10 ENCOUNTER — Other Ambulatory Visit: Payer: Self-pay

## 2024-05-11 ENCOUNTER — Other Ambulatory Visit: Payer: Self-pay

## 2024-05-20 ENCOUNTER — Other Ambulatory Visit: Payer: Self-pay | Admitting: Neurology

## 2024-05-20 ENCOUNTER — Other Ambulatory Visit (HOSPITAL_COMMUNITY): Payer: Self-pay

## 2024-05-21 ENCOUNTER — Other Ambulatory Visit: Payer: Self-pay

## 2024-05-21 ENCOUNTER — Other Ambulatory Visit (HOSPITAL_COMMUNITY): Payer: Self-pay

## 2024-05-22 ENCOUNTER — Other Ambulatory Visit: Payer: Self-pay

## 2024-05-22 MED ORDER — EMGALITY 120 MG/ML ~~LOC~~ SOAJ
120.0000 mg | SUBCUTANEOUS | 0 refills | Status: AC
  Filled 2024-05-22: qty 1, 30d supply, fill #0

## 2024-05-24 ENCOUNTER — Other Ambulatory Visit (HOSPITAL_COMMUNITY): Payer: Self-pay

## 2024-05-25 ENCOUNTER — Other Ambulatory Visit (HOSPITAL_COMMUNITY): Payer: Self-pay

## 2024-05-28 ENCOUNTER — Other Ambulatory Visit: Payer: Self-pay | Admitting: Neurology

## 2024-05-29 ENCOUNTER — Other Ambulatory Visit: Payer: Self-pay

## 2024-06-03 ENCOUNTER — Ambulatory Visit: Admitting: Neurology

## 2024-06-04 ENCOUNTER — Ambulatory Visit: Admitting: Gastroenterology

## 2024-07-03 ENCOUNTER — Ambulatory Visit: Admitting: Neurology
